# Patient Record
Sex: Male | Born: 1940
Health system: Southern US, Community
[De-identification: ages and names within clinical notes are randomized; demographics above are authoritative.]

## PROBLEM LIST (undated history)

## (undated) DIAGNOSIS — H269 Unspecified cataract: Secondary | ICD-10-CM

## (undated) DIAGNOSIS — G629 Polyneuropathy, unspecified: Secondary | ICD-10-CM

## (undated) DIAGNOSIS — R7611 Nonspecific reaction to tuberculin skin test without active tuberculosis: Secondary | ICD-10-CM

## (undated) DIAGNOSIS — I491 Atrial premature depolarization: Secondary | ICD-10-CM

## (undated) DIAGNOSIS — I10 Essential (primary) hypertension: Secondary | ICD-10-CM

## (undated) DIAGNOSIS — R931 Abnormal findings on diagnostic imaging of heart and coronary circulation: Secondary | ICD-10-CM

## (undated) DIAGNOSIS — E785 Hyperlipidemia, unspecified: Secondary | ICD-10-CM

## (undated) HISTORY — DX: Polyneuropathy, unspecified: G62.9

## (undated) HISTORY — PX: WRIST FRACTURE SURGERY: SHX121

## (undated) HISTORY — DX: Essential (primary) hypertension: I10

## (undated) HISTORY — DX: Nonspecific reaction to tuberculin skin test without active tuberculosis: R76.11

## (undated) HISTORY — DX: Unspecified cataract: H26.9

## (undated) HISTORY — DX: Abnormal findings on diagnostic imaging of heart and coronary circulation: R93.1

## (undated) HISTORY — PX: COLONOSCOPY: SHX174

## (undated) HISTORY — DX: Atrial premature depolarization: I49.1

## (undated) HISTORY — DX: Hyperlipidemia, unspecified: E78.5

## (undated) HISTORY — PX: TONSILLECTOMY: SUR1361

---

## 1968-01-19 DIAGNOSIS — R7611 Nonspecific reaction to tuberculin skin test without active tuberculosis: Secondary | ICD-10-CM

## 1968-01-19 HISTORY — DX: Nonspecific reaction to tuberculin skin test without active tuberculosis: R76.11

## 2003-01-19 HISTORY — PX: ROTATOR CUFF REPAIR: SHX139

## 2003-11-06 ENCOUNTER — Ambulatory Visit: Payer: Self-pay | Admitting: Orthopaedic Surgery

## 2003-12-31 ENCOUNTER — Other Ambulatory Visit: Payer: Self-pay

## 2004-01-07 ENCOUNTER — Ambulatory Visit: Payer: Self-pay | Admitting: Orthopaedic Surgery

## 2006-08-04 ENCOUNTER — Ambulatory Visit: Payer: Self-pay | Admitting: Internal Medicine

## 2007-01-19 DIAGNOSIS — G629 Polyneuropathy, unspecified: Secondary | ICD-10-CM

## 2007-01-19 HISTORY — DX: Polyneuropathy, unspecified: G62.9

## 2007-03-09 ENCOUNTER — Ambulatory Visit: Payer: Self-pay | Admitting: Internal Medicine

## 2007-03-15 ENCOUNTER — Ambulatory Visit: Payer: Self-pay | Admitting: Internal Medicine

## 2007-10-12 ENCOUNTER — Ambulatory Visit: Payer: Self-pay | Admitting: Internal Medicine

## 2007-11-29 ENCOUNTER — Ambulatory Visit: Payer: Self-pay | Admitting: Gastroenterology

## 2010-10-19 ENCOUNTER — Ambulatory Visit (INDEPENDENT_AMBULATORY_CARE_PROVIDER_SITE_OTHER): Payer: Medicare Other | Admitting: Internal Medicine

## 2010-10-19 ENCOUNTER — Encounter: Payer: Self-pay | Admitting: Internal Medicine

## 2010-10-19 DIAGNOSIS — E119 Type 2 diabetes mellitus without complications: Secondary | ICD-10-CM

## 2010-10-19 DIAGNOSIS — G608 Other hereditary and idiopathic neuropathies: Secondary | ICD-10-CM

## 2010-10-19 DIAGNOSIS — E669 Obesity, unspecified: Secondary | ICD-10-CM

## 2010-10-19 DIAGNOSIS — Z79899 Other long term (current) drug therapy: Secondary | ICD-10-CM

## 2010-10-19 DIAGNOSIS — E785 Hyperlipidemia, unspecified: Secondary | ICD-10-CM

## 2010-10-19 DIAGNOSIS — E1142 Type 2 diabetes mellitus with diabetic polyneuropathy: Secondary | ICD-10-CM | POA: Insufficient documentation

## 2010-10-19 DIAGNOSIS — E1169 Type 2 diabetes mellitus with other specified complication: Secondary | ICD-10-CM | POA: Insufficient documentation

## 2010-10-19 DIAGNOSIS — G609 Hereditary and idiopathic neuropathy, unspecified: Secondary | ICD-10-CM

## 2010-10-19 DIAGNOSIS — I10 Essential (primary) hypertension: Secondary | ICD-10-CM

## 2010-10-19 LAB — COMPREHENSIVE METABOLIC PANEL
ALT: 29 U/L (ref 0–53)
AST: 27 U/L (ref 0–37)
Alkaline Phosphatase: 44 U/L (ref 39–117)
BUN: 17 mg/dL (ref 6–23)
Chloride: 105 mEq/L (ref 96–112)
Creatinine, Ser: 1 mg/dL (ref 0.4–1.5)
Total Bilirubin: 0.3 mg/dL (ref 0.3–1.2)

## 2010-10-19 LAB — HEMOGLOBIN A1C: Hgb A1c MFr Bld: 6.7 % — ABNORMAL HIGH (ref 4.6–6.5)

## 2010-10-19 LAB — LIPID PANEL
Cholesterol: 139 mg/dL (ref 0–200)
VLDL: 43.4 mg/dL — ABNORMAL HIGH (ref 0.0–40.0)

## 2010-10-19 NOTE — Assessment & Plan Note (Addendum)
Controlled with glipizide and metformin, last A1c was 7.0 in June. Foot exam was done then and microfilament test as well contineu ace inhibitor,

## 2010-10-19 NOTE — Assessment & Plan Note (Signed)
Well-controlled on current regimen. No changes today. 

## 2010-10-19 NOTE — Patient Instructions (Addendum)
Try using Debrox liquid ear drops to soften the ear wax,  One ear per night.  If no results in a week call me to arrange irrigation.  Use Sudafed PE (phenylephrine) 10 mg every 4 to  6 hours if you develop an upper respiratory infection with sinus congestion , allong with your Nettie pot.  This may prevent you from getting a sinus infection ,  But call if you do.   You should try to get 25 minutes of vigorous exercise done 5 tims weekly to help you lose weight.

## 2010-10-19 NOTE — Progress Notes (Signed)
  Subjective:    Patient ID: [name] [name], male    DOB: 06/11/1940, 70 y.o.   MRN: [id number]  HPI 70 yo white male with history of DM, well controlled, hyperlipidemia and hypertension presents for followup.  He does not check his sugars on a dialy baseis but on random cheks his fasting have bene < 130.  No episodes of hypoglycemia. Has regular annual eye exams and no history of foot ulcers.  Does have mild neuropathy .  Up to date on vaccinations.  Has cut back his alchol cosumpiton to one drink per night since last visit due to elevated triglycerides despite therapy with statin and fenofibrate.  No new complaints.  Past Medical History  Diagnosis Date  . Positive PPD, treated 1970  . Peripheral neuropathy 01/2007    positive EMG studies, negative workup for causes  . Diabetes mellitus     Type 2  . Hyperlipidemia   . Hypertension    No current outpatient prescriptions on file prior to visit.     Review of Systems  Constitutional: Negative for fever, chills, diaphoresis, activity change, appetite change, fatigue and unexpected weight change.  HENT: Negative for hearing loss, ear pain, nosebleeds, congestion, sore throat, facial swelling, rhinorrhea, sneezing, drooling, mouth sores, trouble swallowing, neck pain, neck stiffness, dental problem, voice change, postnasal drip, sinus pressure, tinnitus and ear discharge.   Eyes: Negative for photophobia, pain, discharge, redness, itching and visual disturbance.  Respiratory: Negative for apnea, cough, choking, chest tightness, shortness of breath, wheezing and stridor.   Cardiovascular: Negative for chest pain, palpitations and leg swelling.  Gastrointestinal: Negative for nausea, vomiting, abdominal pain, diarrhea, constipation, blood in stool, abdominal distention, anal bleeding and rectal pain.  Genitourinary: Negative for dysuria, urgency, frequency, hematuria, flank pain, decreased urine volume, scrotal swelling, difficulty urinating and  testicular pain.  Musculoskeletal: Negative for myalgias, back pain, joint swelling, arthralgias and gait problem.  Skin: Negative for color change, rash and wound.  Neurological: Positive for numbness. Negative for dizziness, tremors, seizures, syncope, speech difficulty, weakness, light-headedness and headaches.  Psychiatric/Behavioral: Negative for suicidal ideas, hallucinations, behavioral problems, confusion, sleep disturbance, dysphoric mood, decreased concentration and agitation. The patient is not nervous/anxious.   All other systems reviewed and are negative.       Objective:   Physical Exam  Constitutional: He is oriented to person, place, and time.  HENT:  Head: Normocephalic and atraumatic.  Mouth/Throat: Oropharynx is clear and moist.  Eyes: Conjunctivae and EOM are normal.  Neck: Normal range of motion. Neck supple. No JVD present. No thyromegaly present.  Cardiovascular: Normal rate, regular rhythm and normal heart sounds.   Pulmonary/Chest: Effort normal and breath sounds normal. He has no wheezes. He has no rales.  Abdominal: Soft. Bowel sounds are normal. He exhibits no mass. There is no tenderness. There is no rebound.  Musculoskeletal: Normal range of motion. He exhibits no edema.  Neurological: He is alert and oriented to person, place, and time.  Skin: Skin is warm and dry.  Psychiatric: He has a normal mood and affect.          Assessment & Plan:

## 2010-10-19 NOTE — Assessment & Plan Note (Addendum)
Elevated triglycerides after last visit despite use of statin and fenofibrate was addressed with reduction in  alcohol consumption .  Repeat done today

## 2010-10-20 ENCOUNTER — Encounter: Payer: Self-pay | Admitting: Internal Medicine

## 2010-11-16 ENCOUNTER — Other Ambulatory Visit: Payer: Self-pay | Admitting: Internal Medicine

## 2010-12-03 ENCOUNTER — Other Ambulatory Visit: Payer: Self-pay | Admitting: Internal Medicine

## 2011-01-01 ENCOUNTER — Other Ambulatory Visit: Payer: Self-pay | Admitting: Internal Medicine

## 2011-01-20 ENCOUNTER — Ambulatory Visit (INDEPENDENT_AMBULATORY_CARE_PROVIDER_SITE_OTHER): Payer: Medicare Other | Admitting: Internal Medicine

## 2011-01-20 ENCOUNTER — Encounter: Payer: Self-pay | Admitting: Internal Medicine

## 2011-01-20 DIAGNOSIS — E669 Obesity, unspecified: Secondary | ICD-10-CM

## 2011-01-20 DIAGNOSIS — E785 Hyperlipidemia, unspecified: Secondary | ICD-10-CM

## 2011-01-20 DIAGNOSIS — E119 Type 2 diabetes mellitus without complications: Secondary | ICD-10-CM

## 2011-01-20 DIAGNOSIS — Z125 Encounter for screening for malignant neoplasm of prostate: Secondary | ICD-10-CM | POA: Insufficient documentation

## 2011-01-20 DIAGNOSIS — Z1211 Encounter for screening for malignant neoplasm of colon: Secondary | ICD-10-CM

## 2011-01-20 DIAGNOSIS — I10 Essential (primary) hypertension: Secondary | ICD-10-CM

## 2011-01-20 LAB — COMPREHENSIVE METABOLIC PANEL
ALT: 28 U/L (ref 0–53)
AST: 25 U/L (ref 0–37)
CO2: 26 mEq/L (ref 19–32)
Calcium: 10.1 mg/dL (ref 8.4–10.5)
Chloride: 106 mEq/L (ref 96–112)
GFR: 73.68 mL/min (ref >60.00)
Potassium: 4.7 mEq/L (ref 3.5–5.1)
Sodium: 139 mEq/L (ref 135–145)
Total Protein: 7.3 g/dL (ref 6.0–8.3)

## 2011-01-20 LAB — LIPID PANEL
Cholesterol: 153 mg/dL (ref 0–200)
HDL: 41.2 mg/dL (ref >39.00)
Total CHOL/HDL Ratio: 4
Triglycerides: 204 mg/dL — ABNORMAL HIGH (ref 0.0–149.0)
VLDL: 40.8 mg/dL — ABNORMAL HIGH (ref 0.0–40.0)

## 2011-01-20 LAB — MICROALBUMIN / CREATININE URINE RATIO: Microalb, Ur: 0.5 mg/dL (ref 0.0–1.9)

## 2011-01-20 NOTE — Assessment & Plan Note (Signed)
Trigs were 215 last check,  LDL near 70 on Crestor 20 mg daily.[name] [name]

## 2011-01-20 NOTE — Progress Notes (Signed)
Subjective:    Patient ID: [name] [name], male    DOB: [birth year], 71 y.o.   MRN: [id number]  HPIhere for diabetes followup.    Review of Systems     Objective:   Physical Exam        Assessment & Plan:   Subjective:     [name] [name] is a 71 y.o. male who presents for follow up of diabetes.. Current symptoms include: none. Patient denies foot ulcerations, hyperglycemia, hypoglycemia  and paresthesia of the feet. Evaluation to date has been: fasting blood sugar, fasting lipid panel, hemoglobin A1C and microalbuminuria. Home sugars: BGs consistently in an acceptable range. Current treatments: no recent interventions. Last dilated eye exam Sept 2012.  The following portions of the patient's history were reviewed and updated as appropriate: allergies, current medications, past family history, past medical history, past social history, past surgical history and problem list.  Review of Systems A comprehensive review of systems was negative.    Objective:    BP 118/78  Pulse 80  Temp(Src) 97.6 F (36.4 C) (Oral)  Wt 224 lb (101.606 kg)  General Appearance:    Alert, cooperative, no distress, appears stated age  Head:    Normocephalic, without obvious abnormality, atraumatic  Eyes:    PERRL, conjunctiva/corneas clear, EOM's intact, fundi    benign, both eyes       Ears:    Normal TM's and external ear canals, both ears  Nose:   Nares normal, septum midline, mucosa normal, no drainage    or sinus tenderness  Throat:   Lips, mucosa, and tongue normal; teeth and gums normal  Neck:   Supple, symmetrical, trachea midline, no adenopathy;       thyroid:  No enlargement/tenderness/nodules; no carotid   bruit or JVD  Back:     Symmetric, no curvature, ROM normal, no CVA tenderness  Lungs:     Clear to auscultation bilaterally, respirations unlabored  Chest wall:    No tenderness or deformity  Heart:    Regular rate and rhythm, S1 and S2 normal, no murmur, rub   or gallop  Abdomen:      Soft, non-tender, bowel sounds active all four quadrants,    no masses, no organomegaly        Extremities:   Extremities normal, atraumatic, no cyanosis or edema  Pulses:   2+ and symmetric all extremities  Skin:   Skin color, texture, turgor normal, no rashes or lesions  Lymph nodes:   Cervical, supraclavicular, and axillary nodes normal  Neurologic:   CNII-XII intact. Normal strength, sensation and reflexes      throughout      @DMFOOTEXAM @  Patient was evaluated for proper footwear and sizing.  Laboratory: No components found with this basename: A1C      Assessment:    Diabetes mellitus Type II, under good control.    Obesity (BMI 30-39.9) He has lost 8 lbs since last visit 3 months ago by reducing his portion size.   Hyperlipidemia Trigs were 215 last check,  LDL near 70 on Crestor 20 mg daily..    Hypertension .Well controlled on current regimen. Renal function stable, no changes today.    Updated Medication List Outpatient Encounter Prescriptions as of 01/20/2011  Medication Sig Dispense Refill  . amitriptyline (ELAVIL) 50 MG tablet Take 50 mg by mouth at bedtime.        Marland Kitchen amLODipine-benazepril (LOTREL) 10-40 MG per capsule TAKE ONE CAPSULE DAILY  30 capsule  6  .  aspirin 81 MG tablet Take 81 mg by mouth daily.        . Choline Fenofibrate (TRILIPIX) 135 MG capsule Take 135 mg by mouth daily.        Marland Kitchen co-enzyme Q-10 30 MG capsule Take 30 mg by mouth daily.        . CRESTOR 20 MG tablet TAKE ONE TABLET AT BEDTIME  30 tablet  3  . fish oil-omega-3 fatty acids 1000 MG capsule Take 2 g by mouth daily.        Marland Kitchen glipiZIDE (GLUCOTROL) 5 MG tablet TAKE 1/2 TABLET TWICE A DAY WITH MEALS  90 tablet  3  . metFORMIN (GLUCOPHAGE) 1000 MG tablet TAKE ONE TABLET TWICE A DAY  60 tablet  6  . Multiple Vitamin (MULTIVITAMIN) tablet Take 1 tablet by mouth daily.           Plan:    Discussed general issues about diabetes pathophysiology and management. Addressed ADA  diet. Encouraged aerobic exercise. Reminded to get yearly retinal exam.

## 2011-01-20 NOTE — Assessment & Plan Note (Signed)
Well controlled on current regimen. Renal function stable, no changes today. 

## 2011-01-20 NOTE — Assessment & Plan Note (Signed)
He has lost 8 lbs since last visit 3 months ago by reducing his portion size.

## 2011-02-10 ENCOUNTER — Encounter: Payer: Self-pay | Admitting: Internal Medicine

## 2011-04-14 ENCOUNTER — Other Ambulatory Visit: Payer: Self-pay | Admitting: Internal Medicine

## 2011-04-14 DIAGNOSIS — G609 Hereditary and idiopathic neuropathy, unspecified: Secondary | ICD-10-CM

## 2011-04-14 MED ORDER — AMITRIPTYLINE HCL 50 MG PO TABS: 50.0000 mg | ORAL_TABLET | Freq: Every day | ORAL | Status: DC

## 2011-04-28 ENCOUNTER — Other Ambulatory Visit: Payer: Self-pay | Admitting: Internal Medicine

## 2011-04-28 DIAGNOSIS — E785 Hyperlipidemia, unspecified: Secondary | ICD-10-CM

## 2011-04-28 MED ORDER — CHOLINE FENOFIBRATE 135 MG PO CPDR: 135.0000 mg | DELAYED_RELEASE_CAPSULE | Freq: Every day | ORAL | Status: DC

## 2011-04-28 MED ORDER — ROSUVASTATIN CALCIUM 20 MG PO TABS: 20.0000 mg | ORAL_TABLET | Freq: Every day | ORAL | Status: DC

## 2011-05-20 ENCOUNTER — Ambulatory Visit (INDEPENDENT_AMBULATORY_CARE_PROVIDER_SITE_OTHER): Payer: Medicare Other | Admitting: Internal Medicine

## 2011-05-20 ENCOUNTER — Encounter: Payer: Self-pay | Admitting: Internal Medicine

## 2011-05-20 DIAGNOSIS — E119 Type 2 diabetes mellitus without complications: Secondary | ICD-10-CM

## 2011-05-20 DIAGNOSIS — E785 Hyperlipidemia, unspecified: Secondary | ICD-10-CM

## 2011-05-20 DIAGNOSIS — I10 Essential (primary) hypertension: Secondary | ICD-10-CM

## 2011-05-20 LAB — LIPID PANEL
Total CHOL/HDL Ratio: 4
VLDL: 53.6 mg/dL — ABNORMAL HIGH (ref 0.0–40.0)

## 2011-05-20 MED ORDER — ROSUVASTATIN CALCIUM 20 MG PO TABS: 20.0000 mg | ORAL_TABLET | Freq: Every day | ORAL | Status: DC

## 2011-05-20 NOTE — Assessment & Plan Note (Addendum)
Managed with Crestor and tricor  for goal LDL 70 and triglycerides.  Increasing dose of crestor.

## 2011-05-20 NOTE — Assessment & Plan Note (Signed)
Well-controlled on current medications. Normal renal function. No changes today. 

## 2011-05-20 NOTE — Patient Instructions (Signed)
We will check lipids today and I will e mail you results. Sign up for Mychart  Simply saline: use twice daily to flush sinuses

## 2011-05-20 NOTE — Progress Notes (Signed)
Patient ID: [name] [name], male   DOB: 1940/01/22, 71 y.o.   MRN: [id number]  Patient Active Problem List  Diagnoses  . Diabetes mellitus  . Hyperlipidemia  . Hypertension  . Screening for colon cancer  . Obesity (BMI 30-39.9)    Subjective:  CC:   Chief Complaint  Patient presents with  . Follow-up    HPI:   [name] [name] a 71 y.o. male who presents for followup on diabetes hypertension and hyperlipidemia. Is noted complaints today. He has mild diabetic neuropathy but this has not progressed and is managed with nighttime Elavil. He is up-to-date on eye exams. He takes his medications as directed and has no history of myalgias. He does not check his blood sugars on a regular basis since his control has been excellent for several years. He denies chest pain shortness of breath changes in bowel habits changes in vision and episodes of hypoglycemia.   Past Medical History  Diagnosis Date  . Positive PPD, treated 1970  . Peripheral neuropathy 01/2007    positive EMG studies, negative workup for causes  . Diabetes mellitus     Type 2  . Hyperlipidemia   . Hypertension     Past Surgical History  Procedure Date  . Tonsillectomy   . Rotator cuff repair 2005    right (Dr. Mack Guise)         The following portions of the patient's history were reviewed and updated as appropriate: Allergies, current medications, and problem list.    Review of Systems:   12 Pt  review of systems was negative except those addressed in the HPI,     History   Social History  . Marital Status: Married    Spouse Name: N/A    Number of Children: N/A  . Years of Education: N/A   Occupational History  . Not on file.   Social History Main Topics  . Smoking status: Former Smoker    Types: Cigarettes    Quit date: 10/18/1980  . Smokeless tobacco: Former Neurosurgeon    Types: Chew    Quit date: 10/19/1990  . Alcohol Use: Yes     occasioanl  . Drug Use: No  . Sexually Active: Not on file    Other Topics Concern  . Not on file   Social History Narrative  . No narrative on file    Objective:  BP 118/70  Pulse 88  Temp(Src) 98.2 F (36.8 C) (Oral)  Resp 16  Wt 228 lb 8 oz (103.647 kg)  SpO2 95%  General appearance: alert, cooperative and appears stated age Ears: normal TM's and external ear canals both ears Throat: lips, mucosa, and tongue normal; teeth and gums normal Neck: no adenopathy, no carotid bruit, supple, symmetrical, trachea midline and thyroid not enlarged, symmetric, no tenderness/mass/nodules Back: symmetric, no curvature. ROM normal. No CVA tenderness. Lungs: clear to auscultation bilaterally Heart: regular rate and rhythm, S1, S2 normal, no murmur, click, rub or gallop Abdomen: soft, non-tender; bowel sounds normal; no masses,  no organomegaly Pulses: 2+ and symmetric Skin: Skin color, texture, turgor normal. No rashes or lesions Lymph nodes: Cervical, supraclavicular, and axillary nodes normal.  Assessment and Plan:  Diabetes mellitus To manage with medications oral. Repeat hemoglobin A1c is. He has maintained excellent control over several years time and has no proteinuria. He is up to date on annual eye exams. He does have mild neuropathy. Foot exam was done last visit. He does not walk barefoot.  Hyperlipidemia Managed  with Crestor for goal LDL 70 and triglycerides  Hypertension Well controlled on current medications. Normal renal function. No changes today.    Updated Medication List Outpatient Encounter Prescriptions as of 05/20/2011  Medication Sig Dispense Refill  . amitriptyline (ELAVIL) 50 MG tablet Take 1 tablet (50 mg total) by mouth at bedtime.  90 tablet  2  . amLODipine-benazepril (LOTREL) 10-40 MG per capsule TAKE ONE CAPSULE DAILY  30 capsule  6  . aspirin 81 MG tablet Take 81 mg by mouth daily.        . Choline Fenofibrate (TRILIPIX) 135 MG capsule Take 1 capsule (135 mg total) by mouth daily.  90 capsule  5  . co-enzyme  Q-10 30 MG capsule Take 30 mg by mouth daily.        . fish oil-omega-3 fatty acids 1000 MG capsule Take 2 g by mouth daily.        Marland Kitchen glipiZIDE (GLUCOTROL) 5 MG tablet TAKE 1/2 TABLET TWICE A DAY WITH MEALS  90 tablet  3  . metFORMIN (GLUCOPHAGE) 1000 MG tablet TAKE ONE TABLET TWICE A DAY  60 tablet  6  . Multiple Vitamin (MULTIVITAMIN) tablet Take 1 tablet by mouth daily.        . rosuvastatin (CRESTOR) 20 MG tablet Take 1 tablet (20 mg total) by mouth daily.  30 tablet  5  . DISCONTD: rosuvastatin (CRESTOR) 20 MG tablet Take 1 tablet (20 mg total) by mouth daily.  30 tablet  5     Orders Placed This Encounter  Procedures  . Lipid panel  . COMPLETE METABOLIC PANEL WITH GFR  . Hemoglobin A1c  . LDL cholesterol, direct    No Follow-up on file.

## 2011-05-20 NOTE — Assessment & Plan Note (Signed)
To manage with medications oral. Repeat hemoglobin A1c is. He has maintained excellent control over several years time and has no proteinuria. He is up to date on annual eye exams. He does have mild neuropathy. Foot exam was done last visit. He does not walk barefoot.

## 2011-05-21 LAB — COMPLETE METABOLIC PANEL WITH GFR
ALT: 25 U/L (ref 0–53)
AST: 23 U/L (ref 0–37)
CO2: 24 mEq/L (ref 19–32)
Calcium: 9.3 mg/dL (ref 8.4–10.5)
Chloride: 105 mEq/L (ref 96–112)
GFR, Est African American: 75 mL/min
Sodium: 139 mEq/L (ref 135–145)
Total Bilirubin: 0.3 mg/dL (ref 0.3–1.2)
Total Protein: 6.6 g/dL (ref 6.0–8.3)

## 2011-05-21 MED ORDER — ROSUVASTATIN CALCIUM 40 MG PO TABS: 40.0000 mg | ORAL_TABLET | Freq: Every day | ORAL | Status: DC

## 2011-05-21 NOTE — Progress Notes (Signed)
Addended by: Duncan Dull on: 05/21/2011 04:16 PM   Modules accepted: Orders

## 2011-06-01 ENCOUNTER — Encounter: Payer: Self-pay | Admitting: Internal Medicine

## 2011-07-27 LAB — HM DIABETES EYE EXAM
HM Diabetic Eye Exam: NORMAL
HM Diabetic Eye Exam: NORMAL

## 2011-08-16 ENCOUNTER — Encounter: Payer: Self-pay | Admitting: Internal Medicine

## 2011-09-02 ENCOUNTER — Other Ambulatory Visit: Payer: Self-pay | Admitting: Internal Medicine

## 2011-09-02 MED ORDER — AMLODIPINE BESY-BENAZEPRIL HCL 10-40 MG PO CAPS: 1.0000 | ORAL_CAPSULE | Freq: Every day | ORAL | Status: DC

## 2011-09-22 ENCOUNTER — Ambulatory Visit: Payer: Medicare Other | Admitting: Internal Medicine

## 2011-09-27 ENCOUNTER — Encounter: Payer: Self-pay | Admitting: Internal Medicine

## 2011-09-27 ENCOUNTER — Ambulatory Visit (INDEPENDENT_AMBULATORY_CARE_PROVIDER_SITE_OTHER): Payer: Medicare Other | Admitting: Internal Medicine

## 2011-09-27 VITALS — BP 128/78 | HR 98 | Temp 97.8°F | Resp 18 | Wt 232.0 lb

## 2011-09-27 DIAGNOSIS — G609 Hereditary and idiopathic neuropathy, unspecified: Secondary | ICD-10-CM

## 2011-09-27 DIAGNOSIS — E669 Obesity, unspecified: Secondary | ICD-10-CM

## 2011-09-27 DIAGNOSIS — E119 Type 2 diabetes mellitus without complications: Secondary | ICD-10-CM

## 2011-09-27 DIAGNOSIS — G589 Mononeuropathy, unspecified: Secondary | ICD-10-CM

## 2011-09-27 LAB — COMPREHENSIVE METABOLIC PANEL
BUN: 16 mg/dL (ref 6–23)
CO2: 25 mEq/L (ref 19–32)
Creatinine, Ser: 0.9 mg/dL (ref 0.4–1.5)
GFR: 84.47 mL/min (ref >60.00)
Glucose, Bld: 116 mg/dL — ABNORMAL HIGH (ref 70–99)
Sodium: 137 mEq/L (ref 135–145)
Total Bilirubin: 0.2 mg/dL — ABNORMAL LOW (ref 0.3–1.2)
Total Protein: 7 g/dL (ref 6.0–8.3)

## 2011-09-27 LAB — HEMOGLOBIN A1C: Hgb A1c MFr Bld: 6.6 % — ABNORMAL HIGH (ref 4.6–6.5)

## 2011-09-27 LAB — LIPID PANEL
HDL: 48 mg/dL (ref >39.00)
LDL Cholesterol: 53 mg/dL (ref 0–99)
Total CHOL/HDL Ratio: 3
Triglycerides: 148 mg/dL (ref 0.0–149.0)
VLDL: 29.6 mg/dL (ref 0.0–40.0)

## 2011-09-27 MED ORDER — AMLODIPINE BESY-BENAZEPRIL HCL 10-40 MG PO CAPS: 1.0000 | ORAL_CAPSULE | Freq: Every day | ORAL | Status: DC

## 2011-09-27 MED ORDER — METFORMIN HCL 1000 MG PO TABS: 1000.0000 mg | ORAL_TABLET | Freq: Two times a day (BID) | ORAL | Status: DC

## 2011-09-27 MED ORDER — AMITRIPTYLINE HCL 50 MG PO TABS: 50.0000 mg | ORAL_TABLET | Freq: Every day | ORAL | Status: DC

## 2011-09-27 NOTE — Assessment & Plan Note (Signed)
I have addressed  BMI and recommended a low glycemic index diet utilizing smaller more frequent meals to increase metabolism.  I have also recommended that patient start exercising with a goal of 30 minutes of aerobic exercise a minimum of 5 days per week.  

## 2011-09-27 NOTE — Patient Instructions (Addendum)
This is  Dr. Tullos's version of a  "Low GI"  Diet:  All of the foods can be found at grocery stores and in bulk at [employer].  The Atkins protein bars and shakes are available in more varieties at [employer], [employer] and [employer].     7 AM Breakfast:  Low carbohydrate Protein  Shakes (I recommend the EAS AdvantEdge "Carb Control" shakes  Or the low carb shakes by Atkins.   Both are available everywhere:  In  cases at [employer]  Or in 4 packs at grocery stores and pharmacies  2.5 carbs  (Alternative is  a toasted Arnold's Sandwhich Thin w/ peanut butter, a "Bagel Thin" with cream cheese and salmon) or  a scrambled egg burrito made with a low carb tortilla .  Avoid cereal and bananas, oatmeal too unless you are cooking the old fashioned kind that takes 30-40 minutes to prepare.  the rest is overly processed, has minimal fiber, and is loaded with carbohydrates!   10 AM: Protein bar by Atkins (the snack size, under 200 cal).  There are many varieties , available widely again or in bulk in limited varieties at [employer]Js)  Other so called "protein bars" tend to be loaded with carbohydrates.  Remember, in food advertising, the word "energy" is synonymous for " carbohydrate."  Lunch: sandwich of turkey, (or any lunchmeat, grilled meat or canned tuna), fresh avocado, mayonnaise  and cheese on a lower carbohydrate pita bread, flatbread, or tortilla . Ok to use regular mayonnaise. The bread is the only source or carbohydrate that can be decreased (Joseph's makes a pita bread and a flat bread that are 50 cal and 4 net carbs ; Toufayan makes a low carb flatbread that's 100 cal and 9 net carbs  and  Mission makes a low carb whole wheat tortilla  That is 210 cal and 6 net carbs)  3 PM:  Mid day :  Another protein bar,  Or a  cheese stick (100 cal, 0 carbs),  Or 1 ounce of  almonds, walnuts, pistachios, pecans, peanuts,  Macadamia nuts. Or a Dannon light n Fit greek yogurt, 80 cal 8 net carbs . Avoid "granola"; the dried cranberries  and raisins are loaded with carbohydrates. Mixed nuts ok if no raisins or cranberries or dried fruit.      6 PM  Dinner:  "mean and green:"  Meat/chicken/fish or a high protein legume; , with a green salad, and a low GI  Veggie (broccoli, cauliflower, green beans, spinach, brussel sprouts. Lima beans) : Avoid "Low fat dressings, as well as [place] and [place]! They are loaded with sugar! Instead use ranch, vinagrette,  Blue cheese, etc  9 PM snack : Breyer's "low carb" fudgsicle or  ice cream bar (Carb Smart line), or  Weight Watcher's ice cream bar , or another "no sugar added" ice cream;a serving of fresh berries/cherries with whipped cream (Avoid bananas, pineapple, grapes  and watermelon on a regular basis because they are high in sugar)   Remember that snack Substitutions should be less than 15 to 20 carbs  Per serving. Remember to subtract fiber grams and sugar alcohols to get the "net carbs."    

## 2011-09-27 NOTE — Progress Notes (Signed)
Patient ID: [name] [name], male   DOB: [birth year], 71 y.o.   MRN: [id number]  Subjective:     [name] [name] is a 71 y.o. male who presents for follow up of diabetes.. Current symptoms include: paresthesia of the feet. Patient denies foot ulcerations, hypoglycemia      and visual disturbances. Evaluation to date has been: fasting lipid panel, hemoglobin A1C and microalbuminuria. Home sugars: patient does not check sugars. Current treatments: more intensive attention to diet which has been somewhat effective. Last dilated eye exam 2013. The following portions of the patient's history were reviewed and updated as appropriate: allergies, current medications, past family history, past medical history, past social history, past surgical history and problem list.  Review of Systems A comprehensive review of systems was negative.    Objective:   BP 128/78  Pulse 98  Temp 97.8 F (36.6 C) (Oral)  Resp 18  Wt 232 lb (105.235 kg)  SpO2 95% General appearance: alert, cooperative and appears stated age Lungs: clear to auscultation bilaterally Heart: regular rate and rhythm, S1, S2 normal, no murmur, click, rub or gallop Abdomen: soft, non-tender; bowel sounds normal; no masses,  no organomegaly Extremities: extremities normal, atraumatic, no cyanosis or edema Pulses: 2+ and symmetric  @DMFOOTEXAM @  Patient was evaluated for proper footwear and sizing.  Laboratory: Hgba1c, CMET, fasting lipids.     Assessment:    Diabetes mellitus Type II, under excellent control historically.  Repeat labs due.   Up to date on eye exams,  Foot exam unchanged.   .Obesity (BMI 30-39.9) I have addressed  BMI and recommended a low glycemic index diet utilizing smaller more frequent meals to increase metabolism.  I have also recommended that patient start exercising with a goal of 30 minutes of aerobic exercise a minimum of 5 days per week.     Updated Medication List Outpatient Encounter Prescriptions as of  09/27/2011  Medication Sig Dispense Refill  . amitriptyline (ELAVIL) 50 MG tablet Take 1 tablet (50 mg total) by mouth at bedtime.  90 tablet  2  . amLODipine-benazepril (LOTREL) 10-40 MG per capsule Take 1 capsule by mouth daily.  90 capsule  6  . aspirin 81 MG tablet Take 81 mg by mouth daily.        . Choline Fenofibrate (TRILIPIX) 135 MG capsule Take 1 capsule (135 mg total) by mouth daily.  90 capsule  5  . co-enzyme Q-10 30 MG capsule Take 30 mg by mouth daily.        . fish oil-omega-3 fatty acids 1000 MG capsule Take 2 g by mouth daily.        Marland Kitchen glipiZIDE (GLUCOTROL) 5 MG tablet TAKE 1/2 TABLET TWICE A DAY WITH MEALS  90 tablet  3  . metFORMIN (GLUCOPHAGE) 1000 MG tablet Take 1 tablet (1,000 mg total) by mouth 2 (two) times daily with a meal.  180 tablet  6  . Multiple Vitamin (MULTIVITAMIN) tablet Take 1 tablet by mouth daily.        . rosuvastatin (CRESTOR) 40 MG tablet Take 1 tablet (40 mg total) by mouth daily.  30 tablet  5  . DISCONTD: amitriptyline (ELAVIL) 50 MG tablet Take 1 tablet (50 mg total) by mouth at bedtime.  90 tablet  2  . DISCONTD: amLODipine-benazepril (LOTREL) 10-40 MG per capsule Take 1 capsule by mouth daily.  30 capsule  6  . DISCONTD: metFORMIN (GLUCOPHAGE) 1000 MG tablet TAKE ONE TABLET TWICE A DAY  60 tablet  6      Plan:    Encouraged aerobic exercise. Labs: fasting lipid panel, hemoglobin A1C and microalbuminuria.

## 2011-09-29 ENCOUNTER — Ambulatory Visit (INDEPENDENT_AMBULATORY_CARE_PROVIDER_SITE_OTHER): Payer: Medicare Other

## 2011-09-29 DIAGNOSIS — Z23 Encounter for immunization: Secondary | ICD-10-CM

## 2011-12-27 ENCOUNTER — Ambulatory Visit: Payer: Medicare Other | Admitting: Internal Medicine

## 2012-01-13 ENCOUNTER — Other Ambulatory Visit: Payer: Self-pay | Admitting: Internal Medicine

## 2012-01-13 DIAGNOSIS — E785 Hyperlipidemia, unspecified: Secondary | ICD-10-CM

## 2012-01-13 MED ORDER — ROSUVASTATIN CALCIUM 40 MG PO TABS: 40.0000 mg | ORAL_TABLET | Freq: Every day | ORAL | Status: DC

## 2012-01-13 NOTE — Telephone Encounter (Signed)
Pt is needing refill on Crestor 40 mg tablets QTY 30.

## 2012-01-13 NOTE — Telephone Encounter (Signed)
Med filled.  

## 2012-01-27 ENCOUNTER — Ambulatory Visit (INDEPENDENT_AMBULATORY_CARE_PROVIDER_SITE_OTHER): Payer: 59 | Admitting: Internal Medicine

## 2012-01-27 ENCOUNTER — Encounter: Payer: Self-pay | Admitting: Internal Medicine

## 2012-01-27 VITALS — BP 118/78 | HR 100 | Temp 97.6°F | Resp 16 | Wt 232.5 lb

## 2012-01-27 DIAGNOSIS — Z1211 Encounter for screening for malignant neoplasm of colon: Secondary | ICD-10-CM

## 2012-01-27 DIAGNOSIS — Z1331 Encounter for screening for depression: Secondary | ICD-10-CM

## 2012-01-27 DIAGNOSIS — E119 Type 2 diabetes mellitus without complications: Secondary | ICD-10-CM

## 2012-01-27 DIAGNOSIS — I10 Essential (primary) hypertension: Secondary | ICD-10-CM

## 2012-01-27 DIAGNOSIS — G4733 Obstructive sleep apnea (adult) (pediatric): Secondary | ICD-10-CM

## 2012-01-27 LAB — COMPREHENSIVE METABOLIC PANEL
ALT: 34 U/L (ref 0–53)
CO2: 24 mEq/L (ref 19–32)
Creatinine, Ser: 1.1 mg/dL (ref 0.4–1.5)
GFR: 72.67 mL/min (ref >60.00)
Total Bilirubin: 0.6 mg/dL (ref 0.3–1.2)

## 2012-01-27 LAB — HEMOGLOBIN A1C: Hgb A1c MFr Bld: 6.6 % — ABNORMAL HIGH (ref 4.6–6.5)

## 2012-01-28 DIAGNOSIS — G4733 Obstructive sleep apnea (adult) (pediatric): Secondary | ICD-10-CM | POA: Insufficient documentation

## 2012-01-28 NOTE — Assessment & Plan Note (Signed)
Well-controlled.. The patient wears CPAP every night.

## 2012-01-28 NOTE — Assessment & Plan Note (Addendum)
Excellent control and current medications. Hemoglobin A1c is less than 7. No proteinuria by September check. Up-to-date on diabetic eye exams by [facility] July 2013. Does not walk barefoot. Feet are in excellent condition despite having sensory loss

## 2012-01-28 NOTE — Progress Notes (Signed)
Patient ID: [name] [name], male   DOB: 08-30-1940, 72 y.o.   MRN: [id number]  Patient Active Problem List  Diagnosis  . Type II or unspecified type diabetes mellitus with neurological manifestations, not stated as uncontrolled(250.60)  . Hyperlipidemia  . Hypertension  . Screening for colon cancer  . Obesity (BMI 30-39.9)    Subjective:  CC:   Chief Complaint  Patient presents with  . Follow-up    HPI:   [name] [name] a 72 y.o. male who presents Three-month followup on diabetes mellitus, hypertension, and hyperlipidemia. He has been taking all his medications as directed. He has no adverse effects from his statin therapy or from his fenofibrate. He has no joint pain numbness in his feet and no recent hypoglycemic events. He does not check his blood sugars regularly. He has not been exercising very much due to the cold weather.   Past Medical History  Diagnosis Date  . Positive PPD, treated 1970  . Peripheral neuropathy 01/2007    positive EMG studies, negative workup for causes  . Diabetes mellitus     Type 2  . Hyperlipidemia   . Hypertension     Past Surgical History  Procedure Date  . Tonsillectomy   . Rotator cuff repair 2005    right (Dr. Mack Guise)         The following portions of the patient's history were reviewed and updated as appropriate: Allergies, current medications, and problem list.    Review of Systems:   Patient denies headache, fevers, malaise, unintentional weight loss, skin rash, eye pain, sinus congestion and sinus pain, sore throat, dysphagia,  hemoptysis , cough, dyspnea, wheezing, chest pain, palpitations, orthopnea, edema, abdominal pain, nausea, melena, diarrhea, constipation, flank pain, dysuria, hematuria, urinary  Frequency, nocturia, numbness, tingling, seizures,  Focal weakness, Loss of consciousness,  Tremor, insomnia, depression, anxiety, and suicidal ideation.         History   Social History  . Marital Status: Married     Spouse Name: N/A    Number of Children: N/A  . Years of Education: N/A   Occupational History  . Not on file.   Social History Main Topics  . Smoking status: Former Smoker    Types: Cigarettes    Quit date: 10/18/1980  . Smokeless tobacco: Former Neurosurgeon    Types: Chew    Quit date: 10/19/1990  . Alcohol Use: Yes     Comment: occasioanl  . Drug Use: No  . Sexually Active: Not on file   Other Topics Concern  . Not on file   Social History Narrative  . No narrative on file    Objective:  BP 118/78  Pulse 100  Temp 97.6 F (36.4 C) (Oral)  Resp 16  Wt 232 lb 8 oz (105.461 kg)  SpO2 95%  General appearance: alert, cooperative and appears stated age Ears: normal TM's and external ear canals both ears Throat: lips, mucosa, and tongue normal; teeth and gums normal Neck: no adenopathy, no carotid bruit, supple, symmetrical, trachea midline and thyroid not enlarged, symmetric, no tenderness/mass/nodules Back: symmetric, no curvature. ROM normal. No CVA tenderness. Lungs: clear to auscultation bilaterally Heart: regular rate and rhythm, S1, S2 normal, no murmur, click, rub or gallop Abdomen: soft, non-tender; bowel sounds normal; no masses,  no organomegaly Pulses: 2+ and symmetric. Cap refill < 2 sec  Skin: Skin color, texture, turgor normal. No rashes or lesions Lymph nodes: Cervical, supraclavicular, and axillary nodes normal. Foot exam:  Nails  are well trimmed,  No callouses,  Sensation intact to microfilament in 6/10 places bilaterally  Assessment and Plan:  Type II or unspecified type diabetes mellitus with neurological manifestations, not stated as uncontrolled(250.60) Excellent control and current medications. Hemoglobin A1c is less than 7. No proteinuria by September check. Up-to-date on diabetic eye exams by [facility] July 2013. Does not walk barefoot. Feet are in excellent condition despite having sensory loss  Hypertension .Well controlled on current  regimen. Renal function stable, no changes today.  OSA on CPAP Well-controlled.. The patient wears CPAP every night.   Updated Medication List Outpatient Encounter Prescriptions as of 01/27/2012  Medication Sig Dispense Refill  . amitriptyline (ELAVIL) 50 MG tablet Take 1 tablet (50 mg total) by mouth at bedtime.  90 tablet  2  . amLODipine-benazepril (LOTREL) 10-40 MG per capsule Take 1 capsule by mouth daily.  90 capsule  6  . aspirin 81 MG tablet Take 81 mg by mouth daily.        . Choline Fenofibrate (TRILIPIX) 135 MG capsule Take 1 capsule (135 mg total) by mouth daily.  90 capsule  5  . co-enzyme Q-10 30 MG capsule Take 30 mg by mouth daily.        . fish oil-omega-3 fatty acids 1000 MG capsule Take 2 g by mouth daily.        Marland Kitchen glipiZIDE (GLUCOTROL) 5 MG tablet TAKE 1/2 TABLET TWICE A DAY WITH MEALS  90 tablet  3  . metFORMIN (GLUCOPHAGE) 1000 MG tablet Take 1 tablet (1,000 mg total) by mouth 2 (two) times daily with a meal.  180 tablet  6  . Multiple Vitamin (MULTIVITAMIN) tablet Take 1 tablet by mouth daily.        . rosuvastatin (CRESTOR) 40 MG tablet Take 1 tablet (40 mg total) by mouth daily.  30 tablet  5

## 2012-01-28 NOTE — Assessment & Plan Note (Signed)
Well controlled on current regimen. Renal function stable, no changes today. 

## 2012-02-29 LAB — HM COLONOSCOPY: HM Colonoscopy: 4

## 2012-03-14 ENCOUNTER — Ambulatory Visit: Payer: Self-pay | Admitting: General Surgery

## 2012-04-12 ENCOUNTER — Other Ambulatory Visit: Payer: Self-pay | Admitting: *Deleted

## 2012-04-12 MED ORDER — GLIPIZIDE 5 MG PO TABS: ORAL_TABLET | ORAL | Status: DC

## 2012-04-12 NOTE — Telephone Encounter (Signed)
Med filled.  

## 2012-04-24 ENCOUNTER — Encounter: Payer: Self-pay | Admitting: Internal Medicine

## 2012-04-28 ENCOUNTER — Encounter: Payer: Self-pay | Admitting: Internal Medicine

## 2012-04-28 ENCOUNTER — Ambulatory Visit (INDEPENDENT_AMBULATORY_CARE_PROVIDER_SITE_OTHER): Payer: 59 | Admitting: Internal Medicine

## 2012-04-28 VITALS — BP 128/74 | HR 100 | Temp 97.8°F | Resp 16 | Wt 229.5 lb

## 2012-04-28 DIAGNOSIS — E669 Obesity, unspecified: Secondary | ICD-10-CM

## 2012-04-28 DIAGNOSIS — Z83719 Family history of colon polyps, unspecified: Secondary | ICD-10-CM

## 2012-04-28 DIAGNOSIS — G4733 Obstructive sleep apnea (adult) (pediatric): Secondary | ICD-10-CM

## 2012-04-28 DIAGNOSIS — E119 Type 2 diabetes mellitus without complications: Secondary | ICD-10-CM

## 2012-04-28 DIAGNOSIS — Z1211 Encounter for screening for malignant neoplasm of colon: Secondary | ICD-10-CM

## 2012-04-28 DIAGNOSIS — E1149 Type 2 diabetes mellitus with other diabetic neurological complication: Secondary | ICD-10-CM

## 2012-04-28 DIAGNOSIS — E785 Hyperlipidemia, unspecified: Secondary | ICD-10-CM

## 2012-04-28 DIAGNOSIS — I10 Essential (primary) hypertension: Secondary | ICD-10-CM

## 2012-04-28 DIAGNOSIS — Z9989 Dependence on other enabling machines and devices: Secondary | ICD-10-CM

## 2012-04-28 DIAGNOSIS — Z8371 Family history of colonic polyps: Secondary | ICD-10-CM

## 2012-04-28 LAB — MICROALBUMIN / CREATININE URINE RATIO
Microalb Creat Ratio: 0.9 mg/g (ref 0.0–30.0)
Microalb, Ur: 0.9 mg/dL (ref 0.0–1.9)

## 2012-04-28 LAB — COMPREHENSIVE METABOLIC PANEL
ALT: 35 U/L (ref 0–53)
Alkaline Phosphatase: 47 U/L (ref 39–117)
Creatinine, Ser: 1 mg/dL (ref 0.4–1.5)
Glucose, Bld: 107 mg/dL — ABNORMAL HIGH (ref 70–99)
Sodium: 135 mEq/L (ref 135–145)
Total Bilirubin: 0.2 mg/dL — ABNORMAL LOW (ref 0.3–1.2)
Total Protein: 7 g/dL (ref 6.0–8.3)

## 2012-04-28 LAB — HEMOGLOBIN A1C: Hgb A1c MFr Bld: 7.1 % — ABNORMAL HIGH (ref 4.6–6.5)

## 2012-04-28 NOTE — Progress Notes (Signed)
Patient ID: [name] [name], male   DOB: February 22, 1940, 72 y.o.   MRN: [id number]   Patient Active Problem List  Diagnosis  . Type II or unspecified type diabetes mellitus with neurological manifestations, not stated as uncontrolled(250.60)  . Hyperlipidemia  . Hypertension  . Screening for colon cancer  . Obesity (BMI 30-39.9)  . OSA (obstructive sleep apnea)  . FH: colonic polyps    Subjective:  CC:   Chief Complaint  Patient presents with  . Follow-up    3 month    HPI:   [name] Vandyne Meltonis a 72 y.o. male who presents for three-month followup on diabetes mellitus, hyperlipidemia, and hypertension. He has been taking all of his medications as directed. He is tolerating all his medications without myalgias or hypoglycemic events. He does not check his blood sugars. He has no complaints.   annual eye exams are up-to-date. He has seen his cardiologist, Dr. Gwen Pounds, for six-month followup and no changes were made to medications. He is sleeping well.   Past Medical History  Diagnosis Date  . Positive PPD, treated 1970  . Peripheral neuropathy 01/2007    positive EMG studies, negative workup for causes  . Diabetes mellitus     Type 2  . Hyperlipidemia   . Hypertension     Past Surgical History  Procedure Laterality Date  . Tonsillectomy    . Rotator cuff repair  2005    right (Dr. Mack Guise)       The following portions of the patient's history were reviewed and updated as appropriate: Allergies, current medications, and problem list.    Review of Systems:   Patient denies headache, fevers, malaise, unintentional weight loss, skin rash, eye pain, sinus congestion and sinus pain, sore throat, dysphagia,  hemoptysis , cough, dyspnea, wheezing, chest pain, palpitations, orthopnea, edema, abdominal pain, nausea, melena, diarrhea, constipation, flank pain, dysuria, hematuria, urinary  Frequency, nocturia, numbness, tingling, seizures,  Focal weakness, Loss of consciousness,   Tremor, insomnia, depression, anxiety, and suicidal ideation.      History   Social History  . Marital Status: Married    Spouse Name: N/A    Number of Children: N/A  . Years of Education: N/A   Occupational History  . Not on file.   Social History Main Topics  . Smoking status: Former Smoker    Types: Cigarettes    Quit date: 10/18/1980  . Smokeless tobacco: Former Neurosurgeon    Types: Chew    Quit date: 10/19/1990  . Alcohol Use: Yes     Comment: occasioanl  . Drug Use: No  . Sexually Active: Not on file   Other Topics Concern  . Not on file   Social History Narrative  . No narrative on file    Objective:  BP 128/74  Pulse 100  Temp(Src) 97.8 F (36.6 C) (Oral)  Resp 16  Wt 229 lb 8 oz (104.101 kg)  BMI 32.93 kg/m2  SpO2 98%  General appearance: alert, cooperative and appears stated age Ears: normal TM's and external ear canals both ears Throat: lips, mucosa, and tongue normal; teeth and gums normal Neck: no adenopathy, no carotid bruit, supple, symmetrical, trachea midline and thyroid not enlarged, symmetric, no tenderness/mass/nodules Back: symmetric, no curvature. ROM normal. No CVA tenderness. Lungs: clear to auscultation bilaterally Heart: regular rate and rhythm, S1, S2 normal, no murmur, click, rub or gallop Abdomen: soft, non-tender; bowel sounds normal; no masses,  no organomegaly Pulses: 2+ and symmetric Skin: Skin color, texture, turgor  normal. No rashes or lesions Lymph nodes: Cervical, supraclavicular, and axillary nodes normal.  Assessment and Plan:  OSA (obstructive sleep apnea) Has not worn CPAP since  His study in 2008.  No symptoms of heart failure , and HTN is controlled.  He is not interested in repeating study since he is asymptomatic.   Type II or unspecified type diabetes mellitus with neurological manifestations, not stated as uncontrolled(250.60) Well-controlled on current medications.  hemoglobin A1c is 7.1. He is up-to-date on eye  exams and his foot exam is normal.  Normal urine microalbumin to creatinine ratio . He is on the appropriate medications.  Screening for colon cancer .Repeat screening was done Feb 2014 by [facility]. Several polyps found.  R/u 5 yrs  Obesity (BMI 30-39.9) BMI is stable. Patient's CRFS are all well controlled.   Hypertension Well controlled on current regimen. Renal function stable, no changes today.  Hyperlipidemia Well controlled, LDL excellent on current therapy.  Liver and kidney function are normal.  No changes today.  Repeat CMET and lipids in 3 months     Updated Medication List Outpatient Encounter Prescriptions as of 04/28/2012  Medication Sig Dispense Refill  . amitriptyline (ELAVIL) 50 MG tablet Take 1 tablet (50 mg total) by mouth at bedtime.  90 tablet  2  . amLODipine-benazepril (LOTREL) 10-40 MG per capsule Take 1 capsule by mouth daily.  90 capsule  6  . aspirin 81 MG tablet Take 81 mg by mouth daily.        . Choline Fenofibrate (TRILIPIX) 135 MG capsule Take 1 capsule (135 mg total) by mouth daily.  90 capsule  5  . co-enzyme Q-10 30 MG capsule Take 30 mg by mouth daily.        . fish oil-omega-3 fatty acids 1000 MG capsule Take 2 g by mouth daily.        Marland Kitchen glipiZIDE (GLUCOTROL) 5 MG tablet TAKE 1/2 TABLET TWICE A DAY WITH MEALS  90 tablet  3  . metFORMIN (GLUCOPHAGE) 1000 MG tablet Take 1 tablet (1,000 mg total) by mouth 2 (two) times daily with a meal.  180 tablet  6  . Multiple Vitamin (MULTIVITAMIN) tablet Take 1 tablet by mouth daily.        . rosuvastatin (CRESTOR) 40 MG tablet Take 1 tablet (40 mg total) by mouth daily.  30 tablet  5   No facility-administered encounter medications on file as of 04/28/2012.     Orders Placed This Encounter  Procedures  . Lipid panel  . Hemoglobin A1c  . Microalbumin / creatinine urine ratio  . Comprehensive metabolic panel  . HM COLONOSCOPY    Return in about 3 months (around 07/28/2012).

## 2012-04-28 NOTE — Assessment & Plan Note (Addendum)
Has not worn CPAP since  His study in 2008.  No symptoms of heart failure , and HTN is controlled.  He is not interested in repeating study since he is asymptomatic.

## 2012-04-29 ENCOUNTER — Encounter: Payer: Self-pay | Admitting: Internal Medicine

## 2012-04-29 DIAGNOSIS — Z8371 Family history of colonic polyps: Secondary | ICD-10-CM | POA: Insufficient documentation

## 2012-04-29 DIAGNOSIS — Z83719 Family history of colon polyps, unspecified: Secondary | ICD-10-CM | POA: Insufficient documentation

## 2012-04-29 NOTE — Assessment & Plan Note (Signed)
.  Repeat screening was done Feb 2014 by [facility]. Several polyps found.  R/u 5 yrs

## 2012-04-29 NOTE — Assessment & Plan Note (Signed)
Well controlled, LDL excellent on current therapy.  Liver and kidney function are normal.  No changes today.  Repeat CMET and lipids in 3 months

## 2012-04-29 NOTE — Assessment & Plan Note (Signed)
Well controlled on current regimen. Renal function stable, no changes today. 

## 2012-04-29 NOTE — Assessment & Plan Note (Signed)
BMI is stable. Patient's CRFS are all well controlled.

## 2012-04-29 NOTE — Assessment & Plan Note (Signed)
Well-controlled on current medications.  hemoglobin A1c is 7.1. He is up-to-date on eye exams and his foot exam is normal.  Normal urine microalbumin to creatinine ratio . He is on the appropriate medications.

## 2012-05-17 ENCOUNTER — Telehealth: Payer: Self-pay | Admitting: *Deleted

## 2012-05-17 DIAGNOSIS — E785 Hyperlipidemia, unspecified: Secondary | ICD-10-CM

## 2012-05-17 NOTE — Telephone Encounter (Signed)
Refill Request  Fenofibric Acid ER 135 mg  #90  Take 1 capsule daily

## 2012-05-18 MED ORDER — CHOLINE FENOFIBRATE 135 MG PO CPDR: 135.0000 mg | DELAYED_RELEASE_CAPSULE | Freq: Every day | ORAL | Status: DC

## 2012-05-18 NOTE — Telephone Encounter (Signed)
Medication filled.  

## 2012-07-11 ENCOUNTER — Other Ambulatory Visit: Payer: Self-pay | Admitting: *Deleted

## 2012-07-11 DIAGNOSIS — G609 Hereditary and idiopathic neuropathy, unspecified: Secondary | ICD-10-CM

## 2012-07-11 MED ORDER — AMITRIPTYLINE HCL 50 MG PO TABS: 50.0000 mg | ORAL_TABLET | Freq: Every day | ORAL | Status: DC

## 2012-07-17 ENCOUNTER — Other Ambulatory Visit: Payer: Self-pay | Admitting: *Deleted

## 2012-07-17 DIAGNOSIS — E785 Hyperlipidemia, unspecified: Secondary | ICD-10-CM

## 2012-07-17 MED ORDER — ROSUVASTATIN CALCIUM 40 MG PO TABS: 40.0000 mg | ORAL_TABLET | Freq: Every day | ORAL | Status: DC

## 2012-07-28 ENCOUNTER — Ambulatory Visit (INDEPENDENT_AMBULATORY_CARE_PROVIDER_SITE_OTHER): Payer: Medicare Other | Admitting: Internal Medicine

## 2012-07-28 VITALS — BP 144/82 | HR 96 | Temp 97.7°F | Resp 14 | Wt 227.8 lb

## 2012-07-28 DIAGNOSIS — I10 Essential (primary) hypertension: Secondary | ICD-10-CM

## 2012-07-28 DIAGNOSIS — E785 Hyperlipidemia, unspecified: Secondary | ICD-10-CM

## 2012-07-28 DIAGNOSIS — E1149 Type 2 diabetes mellitus with other diabetic neurological complication: Secondary | ICD-10-CM

## 2012-07-28 LAB — COMPREHENSIVE METABOLIC PANEL
Alkaline Phosphatase: 51 U/L (ref 39–117)
CO2: 26 mEq/L (ref 19–32)
Creatinine, Ser: 1 mg/dL (ref 0.4–1.5)
GFR: 81.26 mL/min (ref >60.00)
Glucose, Bld: 135 mg/dL — ABNORMAL HIGH (ref 70–99)
Sodium: 139 mEq/L (ref 135–145)
Total Bilirubin: 0.5 mg/dL (ref 0.3–1.2)
Total Protein: 7.5 g/dL (ref 6.0–8.3)

## 2012-07-28 LAB — LDL CHOLESTEROL, DIRECT: Direct LDL: 69 mg/dL

## 2012-07-28 LAB — HEMOGLOBIN A1C: Hgb A1c MFr Bld: 6.7 % — ABNORMAL HIGH (ref 4.6–6.5)

## 2012-07-28 LAB — LIPID PANEL
Cholesterol: 133 mg/dL (ref 0–200)
HDL: 40.9 mg/dL (ref >39.00)
Total CHOL/HDL Ratio: 3
Triglycerides: 215 mg/dL — ABNORMAL HIGH (ref 0.0–149.0)

## 2012-07-28 NOTE — Patient Instructions (Addendum)
You are doing very well.  I will send you copies of your labs when they are resulted  Don't forget to have your annual eye exam and a skin check with your dermatologist

## 2012-07-30 ENCOUNTER — Encounter: Payer: Self-pay | Admitting: Internal Medicine

## 2012-07-30 NOTE — Assessment & Plan Note (Signed)
Well-controlled on current medications.  hemoglobin A1c has been consistently less than 7.0 . He is up-to-date on eye exams and foot exam. He is on the appropriate medications.

## 2012-07-30 NOTE — Assessment & Plan Note (Signed)
Well controlled on current regimen. Renal function stable, no changes today. 

## 2012-07-30 NOTE — Assessment & Plan Note (Signed)
Well controlled on current statin therapy.   Liver enzymes are normal , no changes today.  

## 2012-07-30 NOTE — Progress Notes (Signed)
Patient ID: [name] [name], male   DOB: [birth year], 72 y.o.   MRN: [id number]  Patient ID: [name] [name], male   DOB: [birth year], 72 y.o.   MRN: [id number]   Patient Active Problem List  Diagnosis  . Type II or unspecified type diabetes mellitus with neurological manifestations, not stated as uncontrolled(250.60)  . Hyperlipidemia  . Hypertension  . Screening for colon cancer  . Obesity (BMI 30-39.9)  . OSA (obstructive sleep apnea)  . FH: colonic polyps    Subjective:  CC:   Chief Complaint  Patient presents with  . Follow-up    3 month    HPI:   [name] Rathbone Meltonis a 72 y.o. male who presents for three-month followup on diabetes mellitus, hyperlipidemia, and hypertension. He has been taking all of his medications as directed. He is tolerating all his medications without myalgias or hypoglycemic events. He does not check his blood sugars. He has no complaints.   annual eye exams are up-to-date. He has seen his cardiologist, Dr. Gwen Pounds, for six-month followup and no changes were made to medications. He is sleeping well.   Past Medical History  Diagnosis Date  . Positive PPD, treated 1970  . Peripheral neuropathy 01/2007    positive EMG studies, negative workup for causes  . Diabetes mellitus     Type 2  . Hyperlipidemia   . Hypertension     Past Surgical History  Procedure Laterality Date  . Tonsillectomy    . Rotator cuff repair  2005    right (Dr. Mack Guise)       The following portions of the patient's history were reviewed and updated as appropriate: Allergies, current medications, and problem list.    Review of Systems:   Patient denies headache, fevers, malaise, unintentional weight loss, skin rash, eye pain, sinus congestion and sinus pain, sore throat, dysphagia,  hemoptysis , cough, dyspnea, wheezing, chest pain, palpitations, orthopnea, edema, abdominal pain, nausea, melena, diarrhea, constipation, flank pain, dysuria, hematuria, urinary  Frequency, nocturia,  numbness, tingling, seizures,  Focal weakness, Loss of consciousness,  Tremor, insomnia, depression, anxiety, and suicidal ideation.      History   Social History  . Marital Status: Married    Spouse Name: N/A    Number of Children: N/A  . Years of Education: N/A   Occupational History  . Not on file.   Social History Main Topics  . Smoking status: Former Smoker    Types: Cigarettes    Quit date: 10/18/1980  . Smokeless tobacco: Former Neurosurgeon    Types: Chew    Quit date: 10/19/1990  . Alcohol Use: Yes     Comment: occasioanl  . Drug Use: No  . Sexually Active: Not on file   Other Topics Concern  . Not on file   Social History Narrative  . No narrative on file    Objective:  BP 128/74  Pulse 100  Temp(Src) 97.8 F (36.6 C) (Oral)  Resp 16  Wt 229 lb 8 oz (104.101 kg)  BMI 32.93 kg/m2  SpO2 98%  General appearance: alert, cooperative and appears stated age Ears: normal TM's and external ear canals both ears Throat: lips, mucosa, and tongue normal; teeth and gums normal Neck: no adenopathy, no carotid bruit, supple, symmetrical, trachea midline and thyroid not enlarged, symmetric, no tenderness/mass/nodules Back: symmetric, no curvature. ROM normal. No CVA tenderness. Lungs: clear to auscultation bilaterally Heart: regular rate and rhythm, S1, S2 normal, no murmur, click, rub or gallop Abdomen: soft, non-tender;  bowel sounds normal; no masses,  no organomegaly Pulses: 2+ and symmetric Skin: Skin color, texture, turgor normal. No rashes or lesions Lymph nodes: Cervical, supraclavicular, and axillary nodes normal. Foot exam:  Nails are well trimmed,  No callouses,  Sensation is deficient to light touch   Assessment and Plan:  Hyperlipidemia Well controlled on current statin therapy.   Liver enzymes are normal , no changes today.  Hypertension Well controlled on current regimen. Renal function stable, no changes today.  Type II or unspecified type diabetes  mellitus with neurological manifestations, not stated as uncontrolled(250.60) Well-controlled on current medications.  hemoglobin A1c has been consistently less than 7.0 . He is up-to-date on eye exams and foot exam. He is on the appropriate medications.   Updated Medication List Outpatient Encounter Prescriptions as of 07/28/2012  Medication Sig Dispense Refill  . amitriptyline (ELAVIL) 50 MG tablet Take 1 tablet (50 mg total) by mouth at bedtime.  90 tablet  0  . amLODipine-benazepril (LOTREL) 10-40 MG per capsule Take 1 capsule by mouth daily.  90 capsule  6  . aspirin 81 MG tablet Take 81 mg by mouth daily.        . Choline Fenofibrate (TRILIPIX) 135 MG capsule Take 1 capsule (135 mg total) by mouth daily.  90 capsule  5  . co-enzyme Q-10 30 MG capsule Take 30 mg by mouth daily.        . fish oil-omega-3 fatty acids 1000 MG capsule Take 2 g by mouth daily.        Marland Kitchen glipiZIDE (GLUCOTROL) 5 MG tablet TAKE 1/2 TABLET TWICE A DAY WITH MEALS  90 tablet  3  . metFORMIN (GLUCOPHAGE) 1000 MG tablet Take 1 tablet (1,000 mg total) by mouth 2 (two) times daily with a meal.  180 tablet  6  . Multiple Vitamin (MULTIVITAMIN) tablet Take 1 tablet by mouth daily.        . rosuvastatin (CRESTOR) 40 MG tablet Take 1 tablet (40 mg total) by mouth daily.  30 tablet  5   No facility-administered encounter medications on file as of 07/28/2012.

## 2012-08-01 NOTE — Telephone Encounter (Signed)
Mailed unread message to pt  

## 2012-08-03 ENCOUNTER — Encounter: Payer: Self-pay | Admitting: *Deleted

## 2012-10-11 ENCOUNTER — Other Ambulatory Visit: Payer: Self-pay | Admitting: Internal Medicine

## 2012-10-11 NOTE — Telephone Encounter (Signed)
Eprescribed.

## 2012-10-16 ENCOUNTER — Other Ambulatory Visit: Payer: Self-pay | Admitting: Internal Medicine

## 2012-10-31 ENCOUNTER — Ambulatory Visit (INDEPENDENT_AMBULATORY_CARE_PROVIDER_SITE_OTHER): Payer: Medicare Other | Admitting: Internal Medicine

## 2012-10-31 ENCOUNTER — Encounter: Payer: Self-pay | Admitting: Internal Medicine

## 2012-10-31 VITALS — BP 112/70 | HR 90 | Temp 97.8°F | Resp 12 | Wt 231.5 lb

## 2012-10-31 DIAGNOSIS — Z1211 Encounter for screening for malignant neoplasm of colon: Secondary | ICD-10-CM

## 2012-10-31 DIAGNOSIS — B351 Tinea unguium: Secondary | ICD-10-CM

## 2012-10-31 DIAGNOSIS — E669 Obesity, unspecified: Secondary | ICD-10-CM

## 2012-10-31 DIAGNOSIS — Z23 Encounter for immunization: Secondary | ICD-10-CM

## 2012-10-31 DIAGNOSIS — E1149 Type 2 diabetes mellitus with other diabetic neurological complication: Secondary | ICD-10-CM

## 2012-10-31 DIAGNOSIS — I10 Essential (primary) hypertension: Secondary | ICD-10-CM

## 2012-10-31 DIAGNOSIS — Z79899 Other long term (current) drug therapy: Secondary | ICD-10-CM

## 2012-10-31 DIAGNOSIS — E785 Hyperlipidemia, unspecified: Secondary | ICD-10-CM

## 2012-10-31 DIAGNOSIS — Z86010 Personal history of colonic polyps: Secondary | ICD-10-CM

## 2012-10-31 LAB — COMPREHENSIVE METABOLIC PANEL
ALT: 29 U/L (ref 0–53)
AST: 30 U/L (ref 0–37)
Albumin: 4 g/dL (ref 3.5–5.2)
Alkaline Phosphatase: 41 U/L (ref 39–117)
BUN: 14 mg/dL (ref 6–23)
Calcium: 9.7 mg/dL (ref 8.4–10.5)
Chloride: 103 mEq/L (ref 96–112)
Creatinine, Ser: 1 mg/dL (ref 0.4–1.5)
Sodium: 138 mEq/L (ref 135–145)
Total Bilirubin: 0.5 mg/dL (ref 0.3–1.2)
Total Protein: 7.1 g/dL (ref 6.0–8.3)

## 2012-10-31 LAB — MICROALBUMIN / CREATININE URINE RATIO
Creatinine,U: 121.1 mg/dL
Microalb Creat Ratio: 0.9 mg/g (ref 0.0–30.0)

## 2012-10-31 LAB — HEMOGLOBIN A1C: Hgb A1c MFr Bld: 7.6 % — ABNORMAL HIGH (ref 4.6–6.5)

## 2012-10-31 MED ORDER — TERBINAFINE HCL 250 MG PO TABS: 250.0000 mg | ORAL_TABLET | Freq: Every day | ORAL | Status: DC

## 2012-10-31 NOTE — Patient Instructions (Signed)
I am stopping your trilipix and Crestor for 3 months while we treat your toenail fungus.  Please stop them today and start the Lamisil in 2 weeks.  Return in 8 weeks for fasting labs.

## 2012-11-01 ENCOUNTER — Encounter: Payer: Self-pay | Admitting: Internal Medicine

## 2012-11-01 LAB — PTH, INTACT AND CALCIUM
Calcium: 9.9 mg/dL (ref 8.4–10.5)
PTH: 7.1 pg/mL — ABNORMAL LOW (ref 14.0–72.0)

## 2012-11-02 DIAGNOSIS — B351 Tinea unguium: Secondary | ICD-10-CM | POA: Insufficient documentation

## 2012-11-02 DIAGNOSIS — Z860100 Personal history of colon polyps, unspecified: Secondary | ICD-10-CM | POA: Insufficient documentation

## 2012-11-02 LAB — HM DIABETES FOOT EXAM

## 2012-11-02 NOTE — Assessment & Plan Note (Signed)
Well controlled on current regimen. Renal function stable, no proteinuria. No changes today.

## 2012-11-02 NOTE — Assessment & Plan Note (Addendum)
Referral for colonoscopy to Dr. Lemar Livings resulting in 5 yr follow up needed for 4 polyps found.

## 2012-11-02 NOTE — Assessment & Plan Note (Signed)
triglycerides were slightly elevated today.  LDL at goal on trilipix and Crestor. Trigs should improve with a low glycemic index diet and Exercise . repeat in 3 months.

## 2012-11-02 NOTE — Progress Notes (Signed)
Patient ID: [name] [name], male   DOB: 1940/07/16, 72 y.o.   MRN: [id number] Patient Active Problem List   Diagnosis Date Noted  . History of colonic polyps 11/02/2012  . FH: colonic polyps 04/29/2012  . OSA (obstructive sleep apnea) 01/28/2012  . Screening for colon cancer 01/20/2011  . Obesity (BMI 30-39.9) 01/20/2011  . Type II or unspecified type diabetes mellitus with neurological manifestations, not stated as uncontrolled(250.60)   . Hyperlipidemia   . Hypertension     Subjective:  CC:   Chief Complaint  Patient presents with  . Follow-up    3 month followup    HPI:   [name] Habib Meltonis a 72 y.o. male who presents  Past Medical History  Diagnosis Date  . Positive PPD, treated 1970  . Peripheral neuropathy 01/2007    positive EMG studies, negative workup for causes  . Diabetes mellitus     Type 2  . Hyperlipidemia   . Hypertension     Past Surgical History  Procedure Laterality Date  . Tonsillectomy    . Rotator cuff repair  2005    right (Dr. Mack Guise)  . Colonoscopy  2004       The following portions of the patient's history were reviewed and updated as appropriate: Allergies, current medications, and problem list.    Review of Systems:   12 Pt  review of systems was negative except those addressed in the HPI,     History   Social History  . Marital Status: Married    Spouse Name: N/A    Number of Children: N/A  . Years of Education: N/A   Occupational History  . Not on file.   Social History Main Topics  . Smoking status: Former Smoker    Types: Cigarettes    Quit date: 10/18/1980  . Smokeless tobacco: Former Neurosurgeon    Types: Chew    Quit date: 10/19/1990  . Alcohol Use: Yes     Comment: occasioanl  . Drug Use: No  . Sexual Activity: Not on file   Other Topics Concern  . Not on file   Social History Narrative  . No narrative on file    Objective:  Filed Vitals:   10/31/12 0840  BP: 112/70  Pulse: 90  Temp: 97.8 F (36.6  C)  Resp: 12     General appearance: alert, cooperative and appears stated age Ears: normal TM's and external ear canals both ears Throat: lips, mucosa, and tongue normal; teeth and gums normal Neck: no adenopathy, no carotid bruit, supple, symmetrical, trachea midline and thyroid not enlarged, symmetric, no tenderness/mass/nodules Back: symmetric, no curvature. ROM normal. No CVA tenderness. Lungs: clear to auscultation bilaterally Heart: regular rate and rhythm, S1, S2 normal, no murmur, click, rub or gallop Abdomen: soft, non-tender; bowel sounds normal; no masses,  no organomegaly Pulses: 2+ and symmetric Skin: Skin color, texture, turgor normal. No rashes or lesions Lymph nodes: Cervical, supraclavicular, and axillary nodes normal. Foot exam:  Nails are well trimmed,  No callouses,  Sensation intact to microfilament.  Onychomycosis of both great toenails   Assessment and Plan:  Type II or unspecified type diabetes mellitus with neurological manifestations, not stated as uncontrolled(250.60) Historically Well-controlled on current medications.  hemoglobin A1c has been consistently less than 7.0 but is slightly up . He is up-to-date on eye exams and his foot exam is normal . No proteinuria   Low glycemic index diet and wt loss recommended.  He is on the appropriate  medications.  Hyperlipidemia triglycerides were slightly elevated today.  LDL at goal on trilipix and Crestor. Trigs should improve with a low glycemic index diet and Exercise . repeat in 3 months.     Hypertension Well controlled on current regimen. Renal function stable, no proteinuria. No changes today.  Obesity (BMI 30-39.9) I have addressed  BMI and recommended wt loss of 10% of body weigh over the next 6 months using a low glycemic index diet and regular exercise a minimum of 5 days per week.    Screening for colon cancer Referral for colonoscopy to Dr. Lemar Livings resulting in 5 yr follow up needed for 4 polyps  found.   Onychomycosis of toenail Worsening,  With nailbed of both great toes significantly involved. Discussed treatment for 3 months with Lamisil requiring suspension of crestor and trilipix to avoid liver toxicity.  Return in 6 weeks for LFTs     Updated Medication List Outpatient Encounter Prescriptions as of 10/31/2012  Medication Sig Dispense Refill  . amitriptyline (ELAVIL) 50 MG tablet TAKE ONE TABLET AT BEDTIME  90 tablet  0  . amLODipine-benazepril (LOTREL) 10-40 MG per capsule TAKE ONE CAPSULE DAILY  90 capsule  0  . aspirin 81 MG tablet Take 81 mg by mouth daily.        . Choline Fenofibrate (TRILIPIX) 135 MG capsule Take 1 capsule (135 mg total) by mouth daily.  90 capsule  5  . co-enzyme Q-10 30 MG capsule Take 30 mg by mouth daily.        . fish oil-omega-3 fatty acids 1000 MG capsule Take 2 g by mouth daily.        Marland Kitchen glipiZIDE (GLUCOTROL) 5 MG tablet TAKE 1/2 TABLET TWICE A DAY WITH MEALS  90 tablet  3  . metFORMIN (GLUCOPHAGE) 1000 MG tablet TAKE ONE TABLET TWICE A DAY WITH MEALS.  180 tablet  1  . Multiple Vitamin (MULTIVITAMIN) tablet Take 1 tablet by mouth daily.        . rosuvastatin (CRESTOR) 40 MG tablet Take 1 tablet (40 mg total) by mouth daily.  30 tablet  5  . terbinafine (LAMISIL) 250 MG tablet Take 1 tablet (250 mg total) by mouth daily.  30 tablet  2   No facility-administered encounter medications on file as of 10/31/2012.

## 2012-11-02 NOTE — Assessment & Plan Note (Signed)
I have addressed  BMI and recommended wt loss of 10% of body weigh over the next 6 months using a low glycemic index diet and regular exercise a minimum of 5 days per week.   

## 2012-11-02 NOTE — Assessment & Plan Note (Signed)
Historically Well-controlled on current medications.  hemoglobin A1c has been consistently less than 7.0 but is slightly up . He is up-to-date on eye exams and his foot exam is normal . No proteinuria   Low glycemic index diet and wt loss recommended.  He is on the appropriate medications.

## 2012-11-02 NOTE — Assessment & Plan Note (Signed)
Worsening,  With nailbed of both great toes significantly involved. Discussed treatment for 3 months with Lamisil requiring suspension of crestor and trilipix to avoid liver toxicity.  Return in 6 weeks for LFTs

## 2012-12-11 ENCOUNTER — Telehealth: Payer: Self-pay | Admitting: Internal Medicine

## 2012-12-11 MED ORDER — GABAPENTIN 300 MG PO CAPS: 300.0000 mg | ORAL_CAPSULE | Freq: Every day | ORAL | Status: DC

## 2012-12-11 NOTE — Telephone Encounter (Signed)
His insurance is requiring that we stop his amitriptyline , even though it is working,  Because it is not recommended for patients over 65.    Gabapentin 300 mg at bedtime is what they are asking me to prescribe (it is generic for Neurontin) . Is he agreeable to the change?

## 2012-12-11 NOTE — Telephone Encounter (Signed)
Patient says he is agreeable if you advise this change and please advise if he needs to ween off the amitriptyline.

## 2012-12-11 NOTE — Telephone Encounter (Signed)
He will need to wean off the amitriptyline,  By reducing dose to 25 mg nightly for 2 weeks , then 25 mg every other night for 2 weeks then stop  Cant start the gabapentin once he reduces the dose to 25 mg

## 2012-12-12 NOTE — Telephone Encounter (Signed)
Patient notified and voiced understanding of instructions  

## 2012-12-29 ENCOUNTER — Other Ambulatory Visit (INDEPENDENT_AMBULATORY_CARE_PROVIDER_SITE_OTHER): Payer: Medicare Other

## 2012-12-29 DIAGNOSIS — E785 Hyperlipidemia, unspecified: Secondary | ICD-10-CM

## 2012-12-29 DIAGNOSIS — B351 Tinea unguium: Secondary | ICD-10-CM

## 2012-12-29 DIAGNOSIS — Z79899 Other long term (current) drug therapy: Secondary | ICD-10-CM

## 2012-12-29 LAB — LIPID PANEL
HDL: 43.6 mg/dL (ref >39.00)
Total CHOL/HDL Ratio: 4
Triglycerides: 223 mg/dL — ABNORMAL HIGH (ref 0.0–149.0)
VLDL: 44.6 mg/dL — ABNORMAL HIGH (ref 0.0–40.0)

## 2012-12-29 LAB — HEPATIC FUNCTION PANEL
AST: 25 U/L (ref 0–37)
Albumin: 4 g/dL (ref 3.5–5.2)
Alkaline Phosphatase: 50 U/L (ref 39–117)
Bilirubin, Direct: 0 mg/dL (ref 0.0–0.3)
Total Bilirubin: 0.6 mg/dL (ref 0.3–1.2)
Total Protein: 7 g/dL (ref 6.0–8.3)

## 2012-12-29 LAB — LDL CHOLESTEROL, DIRECT: Direct LDL: 87.5 mg/dL

## 2013-01-01 ENCOUNTER — Encounter: Payer: Self-pay | Admitting: Internal Medicine

## 2013-01-12 ENCOUNTER — Other Ambulatory Visit: Payer: Self-pay | Admitting: Internal Medicine

## 2013-01-16 LAB — HM DIABETES EYE EXAM

## 2013-03-05 ENCOUNTER — Other Ambulatory Visit: Payer: Self-pay | Admitting: Internal Medicine

## 2013-04-04 ENCOUNTER — Other Ambulatory Visit: Payer: Self-pay | Admitting: Internal Medicine

## 2013-04-16 ENCOUNTER — Ambulatory Visit (INDEPENDENT_AMBULATORY_CARE_PROVIDER_SITE_OTHER): Payer: Medicare Other | Admitting: Internal Medicine

## 2013-04-16 ENCOUNTER — Encounter: Payer: Self-pay | Admitting: Internal Medicine

## 2013-04-16 VITALS — BP 128/76 | HR 85 | Temp 97.6°F | Resp 18 | Wt 229.0 lb

## 2013-04-16 DIAGNOSIS — E669 Obesity, unspecified: Secondary | ICD-10-CM

## 2013-04-16 DIAGNOSIS — I10 Essential (primary) hypertension: Secondary | ICD-10-CM

## 2013-04-16 DIAGNOSIS — Z23 Encounter for immunization: Secondary | ICD-10-CM

## 2013-04-16 DIAGNOSIS — E785 Hyperlipidemia, unspecified: Secondary | ICD-10-CM

## 2013-04-16 DIAGNOSIS — E1149 Type 2 diabetes mellitus with other diabetic neurological complication: Secondary | ICD-10-CM

## 2013-04-16 LAB — COMPREHENSIVE METABOLIC PANEL
ALBUMIN: 4 g/dL (ref 3.5–5.2)
ALK PHOS: 41 U/L (ref 39–117)
ALT: 31 U/L (ref 0–53)
AST: 29 U/L (ref 0–37)
BILIRUBIN TOTAL: 0.4 mg/dL (ref 0.3–1.2)
BUN: 16 mg/dL (ref 6–23)
CO2: 27 mEq/L (ref 19–32)
Calcium: 9.2 mg/dL (ref 8.4–10.5)
Chloride: 104 mEq/L (ref 96–112)
Creatinine, Ser: 1 mg/dL (ref 0.4–1.5)
GFR: 78.3 mL/min (ref >60.00)
Glucose, Bld: 139 mg/dL — ABNORMAL HIGH (ref 70–99)
Potassium: 4.6 mEq/L (ref 3.5–5.1)
Sodium: 138 mEq/L (ref 135–145)
Total Protein: 6.7 g/dL (ref 6.0–8.3)

## 2013-04-16 LAB — LIPID PANEL
CHOL/HDL RATIO: 3
Cholesterol: 138 mg/dL (ref 0–200)
HDL: 43.1 mg/dL (ref >39.00)
LDL Cholesterol: 64 mg/dL (ref 0–99)
Triglycerides: 157 mg/dL — ABNORMAL HIGH (ref 0.0–149.0)
VLDL: 31.4 mg/dL (ref 0.0–40.0)

## 2013-04-16 LAB — MICROALBUMIN / CREATININE URINE RATIO
CREATININE, U: 179.7 mg/dL
MICROALB UR: 0.9 mg/dL (ref 0.0–1.9)
Microalb Creat Ratio: 0.5 mg/g (ref 0.0–30.0)

## 2013-04-16 LAB — HEMOGLOBIN A1C: Hgb A1c MFr Bld: 7.2 % — ABNORMAL HIGH (ref 4.6–6.5)

## 2013-04-16 NOTE — Assessment & Plan Note (Signed)
I have addressed  BMI and recommended wt loss of 10% of body weigh over the next 6 months using a low glycemic index diet and regular exercise a minimum of 5 days per week.   

## 2013-04-16 NOTE — Progress Notes (Signed)
Patient ID: [name] [name], male   DOB: 1940/12/27, 73 y.o.   MRN: [id number]  Patient Active Problem List   Diagnosis Date Noted  . History of colonic polyps 11/02/2012  . Onychomycosis of toenail 11/02/2012  . FH: colonic polyps 04/29/2012  . OSA (obstructive sleep apnea) 01/28/2012  . Screening for colon cancer 01/20/2011  . Obesity (BMI 30-39.9) 01/20/2011  . Type II or unspecified type diabetes mellitus with neurological manifestations, not stated as uncontrolled(250.60)   . Hyperlipidemia   . Hypertension     Subjective:  CC:   Chief Complaint  Patient presents with  . Follow-up  . Diabetes    HPI:   [name] [name] is a 73 y.o. male who presents for follow up on diabetes mellitus, hyperlipidemia, and hypertension. He has been taking all of his medications as directed. He is tolerating all his medications without myalgias or hypoglycemic events. He does not check his blood sugars. He has no complaints.   annual eye exams are up-to-date. He has seen his cardiologist, Dr. Nehemiah Massed, for six-month followup and no changes were made to medications. He is sleeping well. DM follow up,   toerating gabapentin, forced to change from elavil due to insurance.     right hip pain after sitting on bleachers   resolved with chiropractor.  Bursitis.    Three-month followup on diabetes mellitus, hyperlipidemia, and hypertension. He has been taking all of his medications as directed. He is tolerating all his medications without myalgias or hypoglycemic events. He does not check his blood sugars. He has no complaints.   annual eye exams are up-to-date. He has seen his cardiologist, Dr. Nehemiah Massed, for six-month followup and no changes were made to medications. He is sleeping well.  He is tolerating  change in medication from amitriptyline to  gabapentin, forced to change from elavil due to insurance.  He had recently experienced some   right hip pain after prolonged  sitting on bleachers   resolved with  chiropractor.  Bursitis.       Past Medical History  Diagnosis Date  . Positive PPD, treated 1970  . Peripheral neuropathy 01/2007    positive EMG studies, negative workup for causes  . Diabetes mellitus     Type 2  . Hyperlipidemia   . Hypertension     Past Surgical History  Procedure Laterality Date  . Tonsillectomy    . Rotator cuff repair  2005    right (Dr. Francia Greaves)  . Colonoscopy  2004       The following portions of the patient's history were reviewed and updated as appropriate: Allergies, current medications, and problem list.    Review of Systems:   Patient denies headache, fevers, malaise, unintentional weight loss, skin rash, eye pain, sinus congestion and sinus pain, sore throat, dysphagia,  hemoptysis , cough, dyspnea, wheezing, chest pain, palpitations, orthopnea, edema, abdominal pain, nausea, melena, diarrhea, constipation, flank pain, dysuria, hematuria, urinary  Frequency, nocturia, numbness, tingling, seizures,  Focal weakness, Loss of consciousness,  Tremor, insomnia, depression, anxiety, and suicidal ideation.     History   Social History  . Marital Status: Married    Spouse Name: N/A    Number of Children: N/A  . Years of Education: N/A   Occupational History  . Not on file.   Social History Main Topics  . Smoking status: Former Smoker    Types: Cigarettes    Quit date: 10/18/1980  . Smokeless tobacco: Former Systems developer    Types: Loss adjuster, chartered  Quit date: 10/19/1990  . Alcohol Use: Yes     Comment: occasioanl  . Drug Use: No  . Sexual Activity: Not on file   Other Topics Concern  . Not on file   Social History Narrative  . No narrative on file    Objective:  Filed Vitals:   04/16/13 0802  BP: 128/76  Pulse: 85  Temp: 97.6 F (36.4 C)  Resp: 18     General appearance: alert, cooperative and appears stated age Ears: normal TM's and external ear canals both ears Throat: lips, mucosa, and tongue normal; teeth and gums normal Neck: no  adenopathy, no carotid bruit, supple, symmetrical, trachea midline and thyroid not enlarged, symmetric, no tenderness/mass/nodules Back: symmetric, no curvature. ROM normal. No CVA tenderness. Lungs: clear to auscultation bilaterally Heart: regular rate and rhythm, S1, S2 normal, no murmur, click, rub or gallop Abdomen: soft, non-tender; bowel sounds normal; no masses,  no organomegaly Pulses: 2+ and symmetric Skin: Skin color, texture, turgor normal. No rashes or lesions Lymph nodes: Cervical, supraclavicular, and axillary nodes normal.  Assessment and Plan:  Type II or unspecified type diabetes mellitus with neurological manifestations, not stated as uncontrolled(250.60) Historically Well-controlled on current medications, but last  A1c was elevated at 7.6.  Repeating today is better at 7.2  Lab Results  Component Value Date   HGBA1C 7.2* 04/16/2013     . He is up-to-date on eye exams and his foot exam is abnormal secondary to decreased sensation and onychomycosis.  Marland Kitchen No proteinuria   Low glycemic index diet and wt loss recommended.  He is on the appropriate medications.    Hypertension Well controlled on current regimen. Renal function stable, no changes today.  Lab Results  Component Value Date   CREATININE 1.0 04/16/2013   Lab Results  Component Value Date   NA 138 04/16/2013   K 4.6 04/16/2013   CL 104 04/16/2013   CO2 27 04/16/2013     Hyperlipidemia triglycerides were slightly elevated today.  LDL at goal on trilipix and Crestor. Trigs should improve with a low glycemic index diet and Exercise . repeat in 3 months.     Lab Results  Component Value Date   CHOL 138 04/16/2013   HDL 43.10 04/16/2013   LDLCALC 64 04/16/2013   LDLDIRECT 87.5 12/29/2012   TRIG 157.0* 04/16/2013   CHOLHDL 3 04/16/2013   Lab Results  Component Value Date   ALT 31 04/16/2013   AST 29 04/16/2013   ALKPHOS 41 04/16/2013   BILITOT 0.4 04/16/2013     Obesity (BMI 30-39.9) I have addressed   BMI and recommended wt loss of 10% of body weigh over the next 6 months using a low glycemic index diet and regular exercise a minimum of 5 days per week.     Updated Medication List Outpatient Encounter Prescriptions as of 04/16/2013  Medication Sig  . amLODipine-benazepril (LOTREL) 10-40 MG per capsule TAKE ONE CAPSULE DAILY  . aspirin 81 MG tablet Take 81 mg by mouth daily.    . Choline Fenofibrate (TRILIPIX) 135 MG capsule Take 1 capsule (135 mg total) by mouth daily.  Marland Kitchen co-enzyme Q-10 30 MG capsule Take 30 mg by mouth daily.    . CRESTOR 40 MG tablet TAKE 1 TABLET EVERY DAY USUALLY IN THE EVENING  . fish oil-omega-3 fatty acids 1000 MG capsule Take 2 g by mouth daily.    Marland Kitchen gabapentin (NEURONTIN) 300 MG capsule TAKE ONE CAPSULE AT BEDTIME  . glipiZIDE (GLUCOTROL)  5 MG tablet TAKE 1/2 TABLET TWICE A DAY WITH MEALS  . metFORMIN (GLUCOPHAGE) 1000 MG tablet TAKE ONE TABLET TWICE A DAY WITH MEALS.  . Multiple Vitamin (MULTIVITAMIN) tablet Take 1 tablet by mouth daily.    Marland Kitchen terbinafine (LAMISIL) 250 MG tablet Take 1 tablet (250 mg total) by mouth daily.  . [DISCONTINUED] amitriptyline (ELAVIL) 50 MG tablet TAKE ONE TABLET AT BEDTIME     Orders Placed This Encounter  Procedures  . Pneumococcal conjugate vaccine 13-valent  . Hemoglobin A1c  . Lipid panel  . Microalbumin / creatinine urine ratio  . Comprehensive metabolic panel  . HM DIABETES EYE EXAM    No Follow-up on file.

## 2013-04-16 NOTE — Patient Instructions (Signed)
Your Body mass Index is 33  Goal is < 30 ( 208 lbs)  To avoid "obesity tax"   If you Ac is < 7.0 today, I'll  see you in 6 months . If over 7.4,  3 months.     You received your pneumonia vaccine today

## 2013-04-16 NOTE — Assessment & Plan Note (Signed)
Well controlled on current regimen. Renal function stable, no changes today.  Lab Results  Component Value Date   CREATININE 1.0 04/16/2013   Lab Results  Component Value Date   NA 138 04/16/2013   K 4.6 04/16/2013   CL 104 04/16/2013   CO2 27 04/16/2013

## 2013-04-16 NOTE — Assessment & Plan Note (Addendum)
Historically Well-controlled on current medications, but last  A1c was elevated at 7.6.  Repeating today is better at 7.2  Lab Results  Component Value Date   HGBA1C 7.2* 04/16/2013     . He is up-to-date on eye exams and his foot exam is abnormal secondary to decreased sensation and onychomycosis.  Marland Kitchen No proteinuria   Low glycemic index diet and wt loss recommended.  He is on the appropriate medications.

## 2013-04-16 NOTE — Progress Notes (Signed)
Pre-visit discussion using our clinic review tool. No additional management support is needed unless otherwise documented below in the visit note.  

## 2013-04-16 NOTE — Assessment & Plan Note (Signed)
triglycerides were slightly elevated today.  LDL at goal on trilipix and Crestor. Trigs should improve with a low glycemic index diet and Exercise . repeat in 3 months.     Lab Results  Component Value Date   CHOL 138 04/16/2013   HDL 43.10 04/16/2013   LDLCALC 64 04/16/2013   LDLDIRECT 87.5 12/29/2012   TRIG 157.0* 04/16/2013   CHOLHDL 3 04/16/2013   Lab Results  Component Value Date   ALT 31 04/16/2013   AST 29 04/16/2013   ALKPHOS 41 04/16/2013   BILITOT 0.4 04/16/2013

## 2013-04-17 ENCOUNTER — Telehealth: Payer: Self-pay | Admitting: Internal Medicine

## 2013-04-17 NOTE — Telephone Encounter (Signed)
Relevant patient education assigned to patient using Emmi. ° °

## 2013-04-18 ENCOUNTER — Other Ambulatory Visit: Payer: Self-pay | Admitting: Internal Medicine

## 2013-04-18 NOTE — Telephone Encounter (Signed)
Mailed unread message to pt  

## 2013-04-27 ENCOUNTER — Telehealth: Payer: Self-pay

## 2013-04-27 NOTE — Telephone Encounter (Signed)
Relevant patient education assigned to patient using Emmi. ° °

## 2013-05-07 ENCOUNTER — Other Ambulatory Visit: Payer: Self-pay | Admitting: Internal Medicine

## 2013-06-05 ENCOUNTER — Other Ambulatory Visit: Payer: Self-pay | Admitting: Internal Medicine

## 2013-07-04 ENCOUNTER — Other Ambulatory Visit: Payer: Self-pay | Admitting: Internal Medicine

## 2013-07-18 ENCOUNTER — Other Ambulatory Visit: Payer: Self-pay | Admitting: Internal Medicine

## 2013-08-23 ENCOUNTER — Ambulatory Visit (INDEPENDENT_AMBULATORY_CARE_PROVIDER_SITE_OTHER): Payer: Medicare Other | Admitting: Internal Medicine

## 2013-08-23 ENCOUNTER — Encounter: Payer: Self-pay | Admitting: Internal Medicine

## 2013-08-23 VITALS — BP 108/60 | HR 74 | Temp 98.6°F | Resp 12 | Wt 218.5 lb

## 2013-08-23 DIAGNOSIS — E785 Hyperlipidemia, unspecified: Secondary | ICD-10-CM

## 2013-08-23 DIAGNOSIS — G4733 Obstructive sleep apnea (adult) (pediatric): Secondary | ICD-10-CM

## 2013-08-23 DIAGNOSIS — E1149 Type 2 diabetes mellitus with other diabetic neurological complication: Secondary | ICD-10-CM

## 2013-08-23 DIAGNOSIS — I10 Essential (primary) hypertension: Secondary | ICD-10-CM

## 2013-08-23 LAB — HEMOGLOBIN A1C: Hgb A1c MFr Bld: 7.1 % — ABNORMAL HIGH (ref 4.6–6.5)

## 2013-08-23 LAB — COMPREHENSIVE METABOLIC PANEL
ALK PHOS: 56 U/L (ref 39–117)
ALT: 40 U/L (ref 0–53)
AST: 39 U/L — ABNORMAL HIGH (ref 0–37)
Albumin: 4 g/dL (ref 3.5–5.2)
BILIRUBIN TOTAL: 0.6 mg/dL (ref 0.2–1.2)
BUN: 19 mg/dL (ref 6–23)
CO2: 26 meq/L (ref 19–32)
CREATININE: 1 mg/dL (ref 0.4–1.5)
Calcium: 9.9 mg/dL (ref 8.4–10.5)
Chloride: 102 mEq/L (ref 96–112)
GFR: 74.76 mL/min (ref >60.00)
Glucose, Bld: 79 mg/dL (ref 70–99)
Potassium: 4.9 mEq/L (ref 3.5–5.1)
Sodium: 136 mEq/L (ref 135–145)
Total Protein: 6.8 g/dL (ref 6.0–8.3)

## 2013-08-23 LAB — MICROALBUMIN / CREATININE URINE RATIO
Creatinine,U: 191.2 mg/dL
MICROALB UR: 1.1 mg/dL (ref 0.0–1.9)
MICROALB/CREAT RATIO: 0.6 mg/g (ref 0.0–30.0)

## 2013-08-23 NOTE — Patient Instructions (Addendum)
You are doing well.  If your A1c is close to 7.0,  We will see you in 6 months   Diabetes and Sick Day Management Blood sugar (glucose) can be more difficult to control when you are sick. Colds, fever, flu, nausea, vomiting, and diarrhea are all examples of common illnesses that can cause problems for people with diabetes. Loss of body fluids (dehydration) from fever, vomiting, diarrhea, infection, and the stress of a sickness can all cause blood glucose levels to increase. Because of this, it is very important to take your diabetes medicines and to eat some form of carbohydrate food when you are sick. Liquid or soft foods are often tolerated, and they help to replace fluids. HOME CARE INSTRUCTIONS These main guidelines are intended for managing a short-term (24 hours or less) sickness:  Take your usual dose of insulin or oral diabetes medicine. An exception would be if you take any form of metformin. If you cannot eat or drink, you can become dehydrated and should not take this medicine.  Continue to take your insulin even if you are unable to eat solid foods or are vomiting. Your insulin dose may stay the same, or it may need to be increased when you are sick.  You will need to test your blood glucose more often, generally every 2-4 hours. If you have type 1 diabetes, test your urine for ketones every 4 hours. If you have type 2 diabetes, test your urine for ketones as directed by your health care provider.  Eat some form of food that contains carbohydrates. The carbohydrates can be in solid or liquid form. You should eat 45-50 g of carbohydrates every 3-4 hours.  Replace fluids if you have a fever, vomit, or have diarrhea. Ask your health care provider for specific rehydration instructions.  Watch carefully for the signs of ketoacidosis if you have type 1 diabetes. Call your health care provider if any of the following symptoms are present, especially in children:  Moderate to large ketones  in the urine along with a high blood glucose level.  Severe nausea.  Vomiting.  Diarrhea.  Abdominal pain.  Rapid breathing.  Drink extra liquids that do not contain sugar such as water.  Be careful with over-the-counter medicines. Read the labels. They may contain sugar or types of sugars that can increase your blood glucose level. Food Choices for Illness All of the food choices below contain about 15 g of carbohydrates. Plan ahead and keep some of these foods around.    to  cup carbonated beverage containing sugar. Carbonated beverages will usually be better tolerated if they are opened and left at room temperature for a few minutes.   of a twin frozen ice pop.   cup regular gelatin.   cup juice.   cup ice cream or frozen yogurt.   cup cooked cereal.   cup sherbet.  1 cup clear broth or soup.  1 cup cream soup.   cup regular custard.   cup regular pudding.  1 cup sports drink.  1 cup plain yogurt.  1 slice toast.  6 squares saltine crackers.  5 vanilla wafers. SEEK MEDICAL CARE IF:   You are unable to drink fluids, even small amounts.  You have nausea and vomiting for more than 6 hours.  You have diarrhea for more than 6 hours.  Your blood glucose level is more than 240 mg/dL, even with additional insulin.  There is a change in mental status.  You develop an additional  serious sickness.  You have been sick for 2 days and are not getting better.  You have a fever. SEEK IMMEDIATE MEDICAL CARE IF:  You have difficulty breathing.  You have moderate to large ketone levels. MAKE SURE YOU:  Understand these instructions.  Will watch your condition.  Will get help right away if you are not doing well or get worse. Document Released: 01/07/2003 Document Revised: 05/21/2013 Document Reviewed: 06/13/2012 [facility] Patient Information 2015 [place], [place]. This information is not intended to replace advice given to you by your health care  provider. Make sure you discuss any questions you have with your health care provider.

## 2013-08-23 NOTE — Progress Notes (Signed)
Patient ID: [name] [name], male   DOB: [birth year], 73 y.o.   MRN: [id number]  Patient Active Problem List   Diagnosis Date Noted  . History of colonic polyps 11/02/2012  . Onychomycosis of toenail 11/02/2012  . FH: colonic polyps 04/29/2012  . OSA (obstructive sleep apnea) 01/28/2012  . Screening for colon cancer 01/20/2011  . Obesity (BMI 30-39.9) 01/20/2011  . Type II or unspecified type diabetes mellitus with neurological manifestations, not stated as uncontrolled(250.60)   . Hyperlipidemia   . Hypertension     Subjective:  CC:   Chief Complaint  Patient presents with  . Follow-up    4 month followup    HPI:   [name] [name] is a 73 y.o. male who presents for Follow up on chronic conditions including DM Type 2, hypertension,  Obesity wit OSA and hyperlipidemia.   He had an episode of allergic conjuncitivitis was treated last week by optometry with topical eye drops,  Left eye sympotms resolved.  No current symptoms.   Taking medications as directed.  Following a modified low carb diet.  Does not check blood sugars   He is using CPAP nightly for OSA    Past Medical History  Diagnosis Date  . Positive PPD, treated 1970  . Peripheral neuropathy 01/2007    positive EMG studies, negative workup for causes  . Diabetes mellitus     Type 2  . Hyperlipidemia   . Hypertension     Past Surgical History  Procedure Laterality Date  . Tonsillectomy    . Rotator cuff repair  2005    right (Dr. Francia Greaves)  . Colonoscopy  2004       The following portions of the patient's history were reviewed and updated as appropriate: Allergies, current medications, and problem list.    Review of Systems:   Patient denies headache, fevers, malaise, unintentional weight loss, skin rash, eye pain, sinus congestion and sinus pain, sore throat, dysphagia,  hemoptysis , cough, dyspnea, wheezing, chest pain, palpitations, orthopnea, edema, abdominal pain, nausea, melena, diarrhea,  constipation, flank pain, dysuria, hematuria, urinary  Frequency, nocturia, numbness, tingling, seizures,  Focal weakness, Loss of consciousness,  Tremor, insomnia, depression, anxiety, and suicidal ideation.     History   Social History  . Marital Status: Married    Spouse Name: N/A    Number of Children: N/A  . Years of Education: N/A   Occupational History  . Not on file.   Social History Main Topics  . Smoking status: Former Smoker    Types: Cigarettes    Quit date: 10/18/1980  . Smokeless tobacco: Former Systems developer    Types: [place] date: 10/19/1990  . Alcohol Use: Yes     Comment: occasioanl  . Drug Use: No  . Sexual Activity: Not on file   Other Topics Concern  . Not on file   Social History Narrative  . No narrative on file    Objective:  Filed Vitals:   08/23/13 1358  BP: 108/60  Pulse: 74  Temp: 98.6 F (37 C)  Resp: 12     General appearance: alert, cooperative and appears stated age Ears: normal TM's and external ear canals both ears Throat: lips, mucosa, and tongue normal; teeth and gums normal Neck: no adenopathy, no carotid bruit, supple, symmetrical, trachea midline and thyroid not enlarged, symmetric, no tenderness/mass/nodules Back: symmetric, no curvature. ROM normal. No CVA tenderness. Lungs: clear to auscultation bilaterally Heart: regular rate and rhythm, S1, S2  normal, no murmur, click, rub or gallop Abdomen: soft, non-tender; bowel sounds normal; no masses,  no organomegaly Pulses: 2+ and symmetric Skin: Skin color, texture, turgor normal. No rashes or lesions Lymph nodes: Cervical, supraclavicular, and axillary nodes normal.  Assessment and Plan:  Hypertension Well controlled on current regimen. Renal function stable, no changes today.  Lab Results  Component Value Date   CREATININE 1.0 08/23/2013   Lab Results  Component Value Date   NA 136 08/23/2013   K 4.9 08/23/2013   CL 102 08/23/2013   CO2 26 08/23/2013     OSA  (obstructive sleep apnea) Diagnosed by sleep study. he is wearing her CPAP every night a minimum of 6 hours per night and notes improved daytime wakefulness and decreased fatigue   Type II or unspecified type diabetes mellitus with neurological manifestations, not stated as uncontrolled(250.60) Improving control with low GI diet and exercise .  hemoglobin A1c is now 7.1 . Patient is up to date on eye exam and foot exam was done today.  There is  no proteinuria on prior micro urinalysis .   Lab Results  Component Value Date   HGBA1C 7.1* 08/23/2013   Lab Results  Component Value Date   MICROALBUR 1.1 08/23/2013      Hyperlipidemia LDL and triglycerides are at goal on current medications. He has no side effects and liver enzymes are normal. No changes today   Lab Results  Component Value Date   CHOL 138 04/16/2013   HDL 43.10 04/16/2013   LDLCALC 64 04/16/2013   LDLDIRECT 87.5 12/29/2012   TRIG 157.0* 04/16/2013   CHOLHDL 3 04/16/2013   Lab Results  Component Value Date   ALT 40 08/23/2013   AST 39* 08/23/2013   ALKPHOS 56 08/23/2013   BILITOT 0.6 08/23/2013      Updated Medication List Outpatient Encounter Prescriptions as of 08/23/2013  Medication Sig  . amLODipine-benazepril (LOTREL) 10-40 MG per capsule TAKE ONE CAPSULE DAILY  . aspirin 81 MG tablet Take 81 mg by mouth daily.    . Choline Fenofibrate (FENOFIBRIC ACID) 135 MG CPDR TAKE ONE CAPSULE DAILY  . co-enzyme Q-10 30 MG capsule Take 30 mg by mouth daily.    . CRESTOR 40 MG tablet TAKE 1 TABLET EVERY DAY USUALLY IN THE EVENING  . fish oil-omega-3 fatty acids 1000 MG capsule Take 2 g by mouth daily.    Marland Kitchen gabapentin (NEURONTIN) 300 MG capsule TAKE ONE CAPSULE AT BEDTIME  . glipiZIDE (GLUCOTROL) 5 MG tablet TAKE 1/2 TABLET TWICE A DAY WITH MEALS  . metFORMIN (GLUCOPHAGE) 1000 MG tablet TAKE ONE TABLET TWICE A DAY WITH MEALS.  . Multiple Vitamin (MULTIVITAMIN) tablet Take 1 tablet by mouth daily.    . [DISCONTINUED] terbinafine  (LAMISIL) 250 MG tablet Take 1 tablet (250 mg total) by mouth daily.     Orders Placed This Encounter  Procedures  . Hemoglobin A1c  . Comprehensive metabolic panel  . Microalbumin / creatinine urine ratio    No Follow-up on file.

## 2013-08-23 NOTE — Progress Notes (Signed)
Pre visit review using our clinic review tool, if applicable. No additional management support is needed unless otherwise documented below in the visit note. 

## 2013-08-24 ENCOUNTER — Ambulatory Visit: Payer: Medicare Other | Admitting: Internal Medicine

## 2013-08-26 ENCOUNTER — Encounter: Payer: Self-pay | Admitting: Internal Medicine

## 2013-08-26 NOTE — Assessment & Plan Note (Signed)
Well controlled on current regimen. Renal function stable, no changes today.  Lab Results  Component Value Date   CREATININE 1.0 08/23/2013   Lab Results  Component Value Date   NA 136 08/23/2013   K 4.9 08/23/2013   CL 102 08/23/2013   CO2 26 08/23/2013

## 2013-08-26 NOTE — Assessment & Plan Note (Signed)
Diagnosed by sleep study. he is wearing her CPAP every night a minimum of 6 hours per night and notes improved daytime wakefulness and decreased fatigue  

## 2013-08-26 NOTE — Assessment & Plan Note (Signed)
LDL and triglycerides are at goal on current medications. He has no side effects and liver enzymes are normal. No changes today   Lab Results  Component Value Date   CHOL 138 04/16/2013   HDL 43.10 04/16/2013   LDLCALC 64 04/16/2013   LDLDIRECT 87.5 12/29/2012   TRIG 157.0* 04/16/2013   CHOLHDL 3 04/16/2013   Lab Results  Component Value Date   ALT 40 08/23/2013   AST 39* 08/23/2013   ALKPHOS 56 08/23/2013   BILITOT 0.6 08/23/2013

## 2013-08-26 NOTE — Assessment & Plan Note (Addendum)
Improving control with low GI diet and exercise .  hemoglobin A1c is now 7.1 . Patient is up to date on eye exam and foot exam was done today.  There is  no proteinuria on prior micro urinalysis .   Lab Results  Component Value Date   HGBA1C 7.1* 08/23/2013   Lab Results  Component Value Date   MICROALBUR 1.1 08/23/2013

## 2013-08-27 ENCOUNTER — Encounter: Payer: Self-pay | Admitting: Internal Medicine

## 2013-08-27 ENCOUNTER — Ambulatory Visit: Payer: Medicare Other | Admitting: Internal Medicine

## 2013-09-12 ENCOUNTER — Other Ambulatory Visit: Payer: Self-pay | Admitting: Internal Medicine

## 2013-10-10 ENCOUNTER — Other Ambulatory Visit: Payer: Self-pay | Admitting: Internal Medicine

## 2013-10-13 ENCOUNTER — Ambulatory Visit (INDEPENDENT_AMBULATORY_CARE_PROVIDER_SITE_OTHER): Payer: Medicare Other

## 2013-10-13 DIAGNOSIS — Z23 Encounter for immunization: Secondary | ICD-10-CM

## 2013-10-17 ENCOUNTER — Other Ambulatory Visit: Payer: Self-pay | Admitting: Internal Medicine

## 2013-11-07 ENCOUNTER — Other Ambulatory Visit: Payer: Self-pay | Admitting: Internal Medicine

## 2013-12-04 LAB — HM DIABETES EYE EXAM

## 2013-12-10 ENCOUNTER — Other Ambulatory Visit: Payer: Self-pay | Admitting: Internal Medicine

## 2013-12-17 ENCOUNTER — Encounter: Payer: Self-pay | Admitting: *Deleted

## 2013-12-26 ENCOUNTER — Encounter: Payer: Self-pay | Admitting: Internal Medicine

## 2014-01-31 ENCOUNTER — Other Ambulatory Visit: Payer: Self-pay | Admitting: Internal Medicine

## 2014-02-27 ENCOUNTER — Encounter: Payer: Self-pay | Admitting: Internal Medicine

## 2014-02-27 ENCOUNTER — Ambulatory Visit (INDEPENDENT_AMBULATORY_CARE_PROVIDER_SITE_OTHER): Payer: Commercial Managed Care - PPO | Admitting: Internal Medicine

## 2014-02-27 VITALS — BP 118/70 | HR 93 | Temp 97.4°F | Resp 14 | Ht 68.25 in | Wt 232.8 lb

## 2014-02-27 DIAGNOSIS — E669 Obesity, unspecified: Secondary | ICD-10-CM

## 2014-02-27 DIAGNOSIS — Z0000 Encounter for general adult medical examination without abnormal findings: Secondary | ICD-10-CM

## 2014-02-27 DIAGNOSIS — E559 Vitamin D deficiency, unspecified: Secondary | ICD-10-CM

## 2014-02-27 DIAGNOSIS — I10 Essential (primary) hypertension: Secondary | ICD-10-CM

## 2014-02-27 DIAGNOSIS — B351 Tinea unguium: Secondary | ICD-10-CM

## 2014-02-27 DIAGNOSIS — E1142 Type 2 diabetes mellitus with diabetic polyneuropathy: Secondary | ICD-10-CM

## 2014-02-27 DIAGNOSIS — Z125 Encounter for screening for malignant neoplasm of prostate: Secondary | ICD-10-CM

## 2014-02-27 DIAGNOSIS — Z1159 Encounter for screening for other viral diseases: Secondary | ICD-10-CM

## 2014-02-27 DIAGNOSIS — E785 Hyperlipidemia, unspecified: Secondary | ICD-10-CM

## 2014-02-27 LAB — COMPREHENSIVE METABOLIC PANEL
ALK PHOS: 55 U/L (ref 39–117)
ALT: 22 U/L (ref 0–53)
AST: 20 U/L (ref 0–37)
Albumin: 4.4 g/dL (ref 3.5–5.2)
BUN: 25 mg/dL — AB (ref 6–23)
CO2: 28 mEq/L (ref 19–32)
Calcium: 10.6 mg/dL — ABNORMAL HIGH (ref 8.4–10.5)
Chloride: 103 mEq/L (ref 96–112)
Creatinine, Ser: 0.84 mg/dL (ref 0.40–1.50)
GFR: 95.51 mL/min (ref >60.00)
Glucose, Bld: 135 mg/dL — ABNORMAL HIGH (ref 70–99)
POTASSIUM: 5 meq/L (ref 3.5–5.1)
Sodium: 138 mEq/L (ref 135–145)
Total Bilirubin: 0.4 mg/dL (ref 0.2–1.2)
Total Protein: 6.8 g/dL (ref 6.0–8.3)

## 2014-02-27 LAB — LIPID PANEL
Cholesterol: 148 mg/dL (ref 0–200)
HDL: 45.7 mg/dL (ref >39.00)
LDL Cholesterol: 74 mg/dL (ref 0–99)
NONHDL: 102.3
Total CHOL/HDL Ratio: 3
Triglycerides: 143 mg/dL (ref 0.0–149.0)
VLDL: 28.6 mg/dL (ref 0.0–40.0)

## 2014-02-27 LAB — HM DIABETES FOOT EXAM: HM DIABETIC FOOT EXAM: DECREASED

## 2014-02-27 LAB — PSA, MEDICARE: PSA: 1.23 ng/mL (ref 0.10–4.00)

## 2014-02-27 LAB — VITAMIN D 25 HYDROXY (VIT D DEFICIENCY, FRACTURES): VITD: 32.01 ng/mL (ref 30.00–100.00)

## 2014-02-27 LAB — HEMOGLOBIN A1C: HEMOGLOBIN A1C: 7 % — AB (ref 4.6–6.5)

## 2014-02-27 LAB — TSH: TSH: 0.57 u[IU]/mL (ref 0.35–4.50)

## 2014-02-27 MED ORDER — GABAPENTIN 300 MG PO CAPS: 300.0000 mg | ORAL_CAPSULE | Freq: Every day | ORAL | Status: DC

## 2014-02-27 MED ORDER — METFORMIN HCL 1000 MG PO TABS: ORAL_TABLET | ORAL | Status: DC

## 2014-02-27 MED ORDER — GLIPIZIDE 5 MG PO TABS: ORAL_TABLET | ORAL | Status: DC

## 2014-02-27 NOTE — Progress Notes (Signed)
Pre-visit discussion using our clinic review tool. No additional management support is needed unless otherwise documented below in the visit note.  

## 2014-02-27 NOTE — Patient Instructions (Signed)
This is Dr. Lupita Dawn version of a  "Low GI"  Weight loss Diet.  It is appropriate for all patients with normal renal function , gluten tolerance, and advised for patients who have prediabetes or diabetes:   All of the foods can be found at grocery stores and in bulk at [employer].  The Atkins protein bars and shakes are available in more varieties at Target, WalMart and [place].     7 AM Breakfast:  Choose from the following:  < 5 carbs  Weekdays: Low carbohydrate Protein  Shakes (EAS AdvantEdge "Carb Control" shakes, Atkins,  Muscle Milk or Premier Protein shakes)     Weekends:  a scrambled egg/bacon/cheese burrito made with Mission's "carb  balance" whole wheat tortilla  (about 10 net carbs )  Eggs,  bacon /sausage , Joseph's pita /lavash bread or  (5 carbs)  A slice of fritatta ( egg based baked dish, no  crust:  google it) (< 10 carbs)   Avoid cereal and bananas, oatmeal and cream of wheat and grits. They are loaded with carbohydrates!   10 AM: high protein snack  (< 5 carbs)   Protein bar by Atkins  Or KIND  (the snack size, < 200 cal, usually < 6 carbs    A stick of cheese:  Around 1 carb,  100 cal      Other so called "protein bars" tend to be loaded with carbohydrates.  Remember, in food advertising, the word "energy" is synonymous for " carbohydrate."  Lunch:   A Sandwich using the bread choices listed, Can use any  Eggs,  lunchmeat, grilled meat or canned tuna).  Can add avocado, regular mayo/mustard  and cheese.  A Salad using blue cheese, ranch,  Goddess dressing  or vinagrette,  No croutons or "confetti" and no "candied nuts" but regular nuts OK.   2 HARD BOILED EGG WHITES AND A CUP OF one of these greek yogurts:    dannon lt n fit greek yogurt         chobani 100 greek yogurt,    Oikos triple zero greek yogurt       No pretzels or chips.  Pickles and miniature sweet peppers are a good low  carb alternative that provide a "crunch"  The bread is the only source of  carbohydrate in a sandwich and  can be decreased by trying some of these alternatives to traditional loaf bread:   Joseph's pita bread and Lavash (flat) bread :  50 cal and 4 net carbs  available at BJs and WalMart.  Taste better when toasted, use as pita chips  [employer] makes a variety of  flatbreads and  A PITA POCKET.    LOOK FOR  THE ONES THAT ARE 17 NET CARBS OR LESS    Mission makes 2 sizes of  Low carb whole wheat tortillas  (The large one is  210 cal and 6 net carbs)   Avoid "Low fat dressings, as well as Barry Brunner and [place] dressings    3 PM/ Mid day  Snack:  Consider  1 ounce of  almonds, walnuts, pistachios, pecans, peanuts,  Macadamia nuts or a nut medley that does not contain raisins or cranberries.  No "granola"; the dried cranberries and raisins are loaded with carbohydrates. Mixed nuts as long as there are no raisins,  cranberries or dried fruit.    Try the prosciutto/mozzarella cheese sticks by [employer]  In deli /backery section   High protein  To avoid overindulging in snacks: Try drinking a glass of unsweeted almond/coconut milk  Or a cup of coffee with your Atkins chocolate bar to keep you from having 3!!!   Pork rinds!  Yes Pork Rinds are low carb potato chip substitute!   Toasted Joseph's flatbread with hummous dip (chickpeas)       6 PM  Dinner:     Meat/fowl/fish with a green salad, and either broccoli, cauliflower, green beans, spinach, brussel sprouts, bok choy or  Lima beans. Fried in canola oil /olive oil BUT DO NOT BREAD THE PROTEIN!!      There is a low carb pasta by Dreamfield's that is acceptable and tastes great: only 5 digestible carbs/serving.( All grocery stores but BJs carry it )  Prepared Meals:  Try Hurley Cisco Angelo's chicken piccata or chicken or eggplant parm over low carb pasta.(Lowes and BJs)   Marjory Lies Sanchez's "Carnitas" (pulled pork, no sauce,  0 carbs) or his beef pot roast to make a dinner burrito (at [employer])  Barbecue with cole slaw is low  carb BUT NO BUN!  SAME WITH HAMBURGERS     Whole wheat pasta is still full of digestible carbs and  Not as low in glycemic index as Dreamfield's.   Brown rice is still rice,  So skip the rice and noodles if you eat [place] or [place] (or at least limit to 1/2 cup)  9 PM snack :   Breyer's "low carb" fudgsicle or  ice cream bar (Carb Smart line), or  Weight  Watcher's ice cream bar , or another "no sugar added" ice cream;  a serving of fresh berries/cherries with whipped cream   Cheese or greek yogurt   8 ounces of [place] unsweetened almond/cococunut milk  Cheese and crackers (using WASA crackers,  They are low carb) or peanut butter on low carb crackers or pita bread     Avoid bananas, pineapple, grapes  and watermelon on a regular basis because they are high in sugar.  THINK OF THEM AS DESSERT and do not have daily   Remember that snack Substitutions should be less than 10 NET carbs per serving and meals should be < 20 net carbs. Remember that carbohydrates from fiber do not affect blood sugar, so you can  subtract fiber grams to  get the "net carbs " of any particular food item.   Health Maintenance A healthy lifestyle and preventative care can promote health and wellness.  Maintain regular health, dental, and eye exams.  Eat a healthy diet. Foods like vegetables, fruits, whole grains, low-fat dairy products, and lean protein foods contain the nutrients you need and are low in calories. Decrease your intake of foods high in solid fats, added sugars, and salt. Get information about a proper diet from your health care provider, if necessary.  Regular physical exercise is one of the most important things you can do for your health. Most adults should get at least 150 minutes of moderate-intensity exercise (any activity that increases your heart rate and causes you to sweat) each week. In addition, most adults need muscle-strengthening exercises on 2 or more days a week.   Maintain a  healthy weight. The body mass index (BMI) is a screening tool to identify possible weight problems. It provides an estimate of body fat based on height and weight. Your health care provider can find your BMI and can help you achieve or maintain a healthy weight. For males 20 years and older:  A BMI below  18.5 is considered underweight.  A BMI of 18.5 to 24.9 is normal.  A BMI of 25 to 29.9 is considered overweight.  A BMI of 30 and above is considered obese.  Maintain normal blood lipids and cholesterol by exercising and minimizing your intake of saturated fat. Eat a balanced diet with plenty of fruits and vegetables. Blood tests for lipids and cholesterol should begin at age 45 and be repeated every 5 years. If your lipid or cholesterol levels are high, you are over age 29, or you are at high risk for heart disease, you may need your cholesterol levels checked more frequently.Ongoing high lipid and cholesterol levels should be treated with medicines if diet and exercise are not working.  If you smoke, find out from your health care provider how to quit. If you do not use tobacco, do not start.  Lung cancer screening is recommended for adults aged 26-80 years who are at high risk for developing lung cancer because of a history of smoking. A yearly low-dose CT scan of the lungs is recommended for people who have at least a 30-pack-year history of smoking and are current smokers or have quit within the past 15 years. A pack year of smoking is smoking an average of 1 pack of cigarettes a day for 1 year (for example, a 30-pack-year history of smoking could mean smoking 1 pack a day for 30 years or 2 packs a day for 15 years). Yearly screening should continue until the smoker has stopped smoking for at least 15 years. Yearly screening should be stopped for people who develop a health problem that would prevent them from having lung cancer treatment.  If you choose to drink alcohol, do not have more than  2 drinks per day. One drink is considered to be 12 oz (360 mL) of beer, 5 oz (150 mL) of wine, or 1.5 oz (45 mL) of liquor.  Avoid the use of street drugs. Do not share needles with anyone. Ask for help if you need support or instructions about stopping the use of drugs.  High blood pressure causes heart disease and increases the risk of stroke. Blood pressure should be checked at least every 1-2 years. Ongoing high blood pressure should be treated with medicines if weight loss and exercise are not effective.  If you are 14-30 years old, ask your health care provider if you should take aspirin to prevent heart disease.  Diabetes screening involves taking a blood sample to check your fasting blood sugar level. This should be done once every 3 years after age 76 if you are at a normal weight and without risk factors for diabetes. Testing should be considered at a younger age or be carried out more frequently if you are overweight and have at least 1 risk factor for diabetes.  Colorectal cancer can be detected and often prevented. Most routine colorectal cancer screening begins at the age of 64 and continues through age 6. However, your health care provider may recommend screening at an earlier age if you have risk factors for colon cancer. On a yearly basis, your health care provider may provide home test kits to check for hidden blood in the stool. A small camera at the end of a tube may be used to directly examine the colon (sigmoidoscopy or colonoscopy) to detect the earliest forms of colorectal cancer. Talk to your health care provider about this at age 36 when routine screening begins. A direct exam of the colon should be  repeated every 5-10 years through age 20, unless early forms of precancerous polyps or small growths are found.  People who are at an increased risk for hepatitis B should be screened for this virus. You are considered at high risk for hepatitis B if:  You were born in a country  where hepatitis B occurs often. Talk with your health care provider about which countries are considered high risk.  Your parents were born in a high-risk country and you have not received a shot to protect against hepatitis B (hepatitis B vaccine).  You have HIV or AIDS.  You use needles to inject street drugs.  You live with, or have sex with, someone who has hepatitis B.  You are a man who has sex with other men (MSM).  You get hemodialysis treatment.  You take certain medicines for conditions like cancer, organ transplantation, and autoimmune conditions.  Hepatitis C blood testing is recommended for all people born from [birth year] through 1963 and any individual with known risk factors for hepatitis C.  Healthy men should no longer receive prostate-specific antigen (PSA) blood tests as part of routine cancer screening. Talk to your health care provider about prostate cancer screening.  Testicular cancer screening is not recommended for adolescents or adult males who have no symptoms. Screening includes self-exam, a health care provider exam, and other screening tests. Consult with your health care provider about any symptoms you have or any concerns you have about testicular cancer.  Practice safe sex. Use condoms and avoid high-risk sexual practices to reduce the spread of sexually transmitted infections (STIs).  You should be screened for STIs, including gonorrhea and chlamydia if:  You are sexually active and are younger than 24 years.  You are older than 24 years, and your health care provider tells you that you are at risk for this type of infection.  Your sexual activity has changed since you were last screened, and you are at an increased risk for chlamydia or gonorrhea. Ask your health care provider if you are at risk.  If you are at risk of being infected with HIV, it is recommended that you take a prescription medicine daily to prevent HIV infection. This is called  pre-exposure prophylaxis (PrEP). You are considered at risk if:  You are a man who has sex with other men (MSM).  You are a heterosexual man who is sexually active with multiple partners.  You take drugs by injection.  You are sexually active with a partner who has HIV.  Talk with your health care provider about whether you are at high risk of being infected with HIV. If you choose to begin PrEP, you should first be tested for HIV. You should then be tested every 3 months for as long as you are taking PrEP.  Use sunscreen. Apply sunscreen liberally and repeatedly throughout the day. You should seek shade when your shadow is shorter than you. Protect yourself by wearing long sleeves, pants, a wide-brimmed hat, and sunglasses year round whenever you are outdoors.  Tell your health care provider of new moles or changes in moles, especially if there is a change in shape or color. Also, tell your health care provider if a mole is larger than the size of a pencil eraser.  A one-time screening for abdominal aortic aneurysm (AAA) and surgical repair of large AAAs by ultrasound is recommended for men aged 11-75 years who are current or former smokers.  Stay current with your vaccines (immunizations). Document Released:  07/03/2007 Document Revised: 01/09/2013 Document Reviewed: 06/01/2010 ExitCare Patient Information 2015 [place], [place]. This information is not intended to replace advice given to you by your health care provider. Make sure you discuss any questions you have with your health care provider.

## 2014-02-27 NOTE — Progress Notes (Signed)
Patient ID: [name] [name], male   DOB: May 04, 1940, 74 y.o.   MRN: [id number]  The patient is here for annual Medicare wellness examination and management of other chronic and acute problems.  HAS AN ADVANCED DIRECTIVE AT HOME,  SON Carin Primrose Lear  has power of attorney for all decision  Full CODE.      The risk factors are reflected in the social history.  The roster of all physicians providing medical care to patient - is listed in the Snapshot section of the chart.  Activities of daily living:  The patient is 100% independent in all ADLs: dressing, toileting, feeding as well as independent mobility  Home safety : The patient has smoke detectors in the home. They wear seatbelts.  There are no firearms at home. There is no violence in the home.   There is no risks for hepatitis, STDs or HIV. There is no   history of blood transfusion. They have no travel history to infectious disease endemic areas of the world.  The patient has seen their dentist in the last six month. They have seen their eye doctor in the last year. They admit to slight hearing difficulty with regard to whispered voices and some television programs.  They have deferred audiologic testing in the last year.  They do not  have excessive sun exposure. Discussed the need for sun protection: hats, long sleeves and use of sunscreen if there is significant sun exposure.   Diet: the importance of a healthy diet is discussed. They do have a healthy diet.  The benefits of regular aerobic exercise were discussed. She walks 4 times per week ,  20 minutes.   Depression screen: there are no signs or vegative symptoms of depression- irritability, change in appetite, anhedonia, sadness/tearfullness.  Cognitive assessment: the patient manages all their financial and personal affairs and is actively engaged. They could relate day,date,year and events; recalled 2/3 objects at 3 minutes; performed clock-face test normally.  The following  portions of the patient's history were reviewed and updated as appropriate: allergies, current medications, past family history, past medical history,  past surgical history, past social history  and problem list.  Visual acuity was not assessed per patient preference since she has regular follow up with her ophthalmologist. Hearing and body mass index were assessed and reviewed.   During the course of the visit the patient was educated and counseled about appropriate screening and preventive services including : fall prevention , diabetes screening, nutrition counseling, colorectal cancer screening, and recommended immunizations.    Review of systems:  Patient denies headache, fevers, malaise, unintentional weight loss, skin rash, eye pain, sinus congestion and sinus pain, sore throat, dysphagia,  hemoptysis , cough, dyspnea, wheezing, chest pain, palpitations, orthopnea, edema, abdominal pain, nausea, melena, diarrhea, constipation, flank pain, dysuria, hematuria, urinary  Frequency, nocturia, , tingling, seizures,  Focal weakness, Loss of consciousness,  Tremor, insomnia, depression, anxiety, and suicidal ideation.     Objective:  BP 118/70 mmHg  Pulse 93  Temp(Src) 97.4 F (36.3 C) (Oral)  Resp 14  Ht 5' 8.25" (1.734 m)  Wt 232 lb 12 oz (105.575 kg)  BMI 35.11 kg/m2  SpO2 94%  General Appearance:    Alert, cooperative, no distress, appears stated age  Head:    Normocephalic, without obvious abnormality, atraumatic  Eyes:    PERRL, conjunctiva/corneas clear, EOM's intact, fundi    benign, both eyes       Ears:    Normal TM's and  external ear canals, both ears  Nose:   Nares normal, septum midline, mucosa normal, no drainage   or sinus tenderness  Throat:   Lips, mucosa, and tongue normal; teeth and gums normal  Neck:   Supple, symmetrical, trachea midline, no adenopathy;       thyroid:  No enlargement/tenderness/nodules; no carotid   bruit or JVD  Back:     Symmetric, no  curvature, ROM normal, no CVA tenderness  Lungs:     Clear to auscultation bilaterally, respirations unlabored  Chest wall:    No tenderness or deformity  Heart:    Regular rate and rhythm, S1 and S2 normal, no murmur, rub   or gallop  Abdomen:     Soft, non-tender, bowel sounds active all four quadrants,    no masses, no organomegaly  Genitalia:    Normal male without lesion, discharge or tenderness  Rectal:    Normal tone, normal prostate, no masses or tenderness;   guaiac negative stool  Extremities:   Extremities normal, atraumatic, no cyanosis or edema  Pulses:   2+ and symmetric all extremities  Skin:   Skin color, texture, turgor normal, no rashes or lesions  Lymph nodes:   Cervical, supraclavicular, and axillary nodes normal  Neurologic:   CNII-XII intact. Normal strength, sensation and reflexes      throughout   Assessment and Plan:  Problem List Items Addressed This Visit    Type 2 DM with diabetic neuropathy affecting both sides of body    Improving control with low GI diet and exercise .  hemoglobin A1c is now 7.0. Patient is up to date on eye exam and foot exam was done today.  There is  no proteinuria on prior micro urinalysis .   Lab Results  Component Value Date   HGBA1C 7.0* 02/27/2014   Lab Results  Component Value Date   MICROALBUR 1.1 08/23/2013            Relevant Medications   glipiZIDE (GLUCOTROL) tablet   metFORMIN (GLUCOPHAGE) tablet   Other Relevant Orders   Hemoglobin A1c (Completed)   Lipid panel (Completed)   Onychomycosis of toenail   Obesity (BMI 30-39.9)    I have addressed  BMI and recommended a low glycemic index diet utilizing smaller more frequent meals to increase metabolism.  I have also recommended that patient start exercising with a goal of 30 minutes of aerobic exercise a minimum of 5 days per week.       Relevant Medications   glipiZIDE (GLUCOTROL) tablet   metFORMIN (GLUCOPHAGE) tablet   Medicare annual wellness visit,  subsequent    Annual Medicare wellness  exam was done as well as a comprehensive physical exam and management of acute and chronic conditions .  During the course of the visit the patient was educated and counseled about appropriate screening and preventive services including : fall prevention , diabetes screening, nutrition counseling, colorectal cancer screening, and recommended immunizations.  Printed recommendations for health maintenance screenings was given.       Hypertension - Primary   Relevant Orders   Comprehensive metabolic panel (Completed)   Hyperlipidemia    LDL and triglycerides are at goal on current medications. He has no side effects and liver enzymes are normal. No changes today Lab Results  Component Value Date   CHOL 148 02/27/2014   HDL 45.70 02/27/2014   LDLCALC 74 02/27/2014   LDLDIRECT 87.5 12/29/2012   TRIG 143.0 02/27/2014   CHOLHDL 3 02/27/2014  Lab Results  Component Value Date   ALT 22 02/27/2014   AST 20 02/27/2014   ALKPHOS 55 02/27/2014   BILITOT 0.4 02/27/2014          Other Visit Diagnoses    Obesity        Relevant Medications    glipiZIDE (GLUCOTROL) tablet    metFORMIN (GLUCOPHAGE) tablet    Other Relevant Orders    TSH (Completed)    Need for hepatitis C screening test        Relevant Orders    Hepatitis C antibody (Completed)    Vitamin D deficiency        Relevant Orders    Vit D  25 hydroxy (rtn osteoporosis monitoring) (Completed)    Prostate cancer screening        Relevant Orders    PSA, Medicare (Completed)

## 2014-02-28 LAB — HEPATITIS C ANTIBODY: HCV AB: NEGATIVE

## 2014-03-01 DIAGNOSIS — Z0000 Encounter for general adult medical examination without abnormal findings: Secondary | ICD-10-CM | POA: Insufficient documentation

## 2014-03-01 NOTE — Assessment & Plan Note (Signed)

## 2014-03-01 NOTE — Assessment & Plan Note (Signed)
Improving control with low GI diet and exercise .  hemoglobin A1c is now 7.0. Patient is up to date on eye exam and foot exam was done today.  There is  no proteinuria on prior micro urinalysis .   Lab Results  Component Value Date   HGBA1C 7.0* 02/27/2014   Lab Results  Component Value Date   MICROALBUR 1.1 08/23/2013

## 2014-03-01 NOTE — Assessment & Plan Note (Signed)
I have addressed  BMI and recommended a low glycemic index diet utilizing smaller more frequent meals to increase metabolism.  I have also recommended that patient start exercising with a goal of 30 minutes of aerobic exercise a minimum of 5 days per week.  

## 2014-03-01 NOTE — Assessment & Plan Note (Signed)
LDL and triglycerides are at goal on current medications. He has no side effects and liver enzymes are normal. No changes today Lab Results  Component Value Date   CHOL 148 02/27/2014   HDL 45.70 02/27/2014   LDLCALC 74 02/27/2014   LDLDIRECT 87.5 12/29/2012   TRIG 143.0 02/27/2014   CHOLHDL 3 02/27/2014   Lab Results  Component Value Date   ALT 22 02/27/2014   AST 20 02/27/2014   ALKPHOS 55 02/27/2014   BILITOT 0.4 02/27/2014

## 2014-03-02 ENCOUNTER — Encounter: Payer: Self-pay | Admitting: Internal Medicine

## 2014-03-08 ENCOUNTER — Other Ambulatory Visit: Payer: Self-pay | Admitting: Internal Medicine

## 2014-04-18 ENCOUNTER — Other Ambulatory Visit: Payer: Self-pay | Admitting: Internal Medicine

## 2014-05-14 ENCOUNTER — Telehealth: Payer: Self-pay | Admitting: Internal Medicine

## 2014-05-14 NOTE — Telephone Encounter (Signed)
Pt received a automated system call stating to contact the office to make an appointment for an eye exam. Pt stated that he had an exam on December 04, 2013 with Dr. Heron Sabins office.

## 2014-05-15 NOTE — Telephone Encounter (Signed)
Noted eye exam extracted

## 2014-06-28 ENCOUNTER — Ambulatory Visit: Payer: Commercial Managed Care - PPO | Admitting: Internal Medicine

## 2014-07-10 ENCOUNTER — Ambulatory Visit (INDEPENDENT_AMBULATORY_CARE_PROVIDER_SITE_OTHER): Payer: 59 | Admitting: Internal Medicine

## 2014-07-10 ENCOUNTER — Encounter: Payer: Self-pay | Admitting: Internal Medicine

## 2014-07-10 VITALS — BP 106/60 | HR 78 | Temp 97.6°F | Resp 14 | Ht 68.0 in | Wt 215.5 lb

## 2014-07-10 DIAGNOSIS — E669 Obesity, unspecified: Secondary | ICD-10-CM

## 2014-07-10 DIAGNOSIS — I10 Essential (primary) hypertension: Secondary | ICD-10-CM | POA: Diagnosis not present

## 2014-07-10 DIAGNOSIS — E1142 Type 2 diabetes mellitus with diabetic polyneuropathy: Secondary | ICD-10-CM | POA: Diagnosis not present

## 2014-07-10 DIAGNOSIS — E785 Hyperlipidemia, unspecified: Secondary | ICD-10-CM | POA: Diagnosis not present

## 2014-07-10 LAB — COMPREHENSIVE METABOLIC PANEL
ALT: 28 U/L (ref 0–53)
AST: 28 U/L (ref 0–37)
Albumin: 4.1 g/dL (ref 3.5–5.2)
Alkaline Phosphatase: 43 U/L (ref 39–117)
BUN: 25 mg/dL — ABNORMAL HIGH (ref 6–23)
CO2: 24 meq/L (ref 19–32)
CREATININE: 1.03 mg/dL (ref 0.40–1.50)
Calcium: 9.7 mg/dL (ref 8.4–10.5)
Chloride: 106 mEq/L (ref 96–112)
GFR: 75.41 mL/min (ref >60.00)
GLUCOSE: 116 mg/dL — AB (ref 70–99)
Potassium: 4.3 mEq/L (ref 3.5–5.1)
Sodium: 137 mEq/L (ref 135–145)
TOTAL PROTEIN: 7 g/dL (ref 6.0–8.3)
Total Bilirubin: 0.3 mg/dL (ref 0.2–1.2)

## 2014-07-10 LAB — MICROALBUMIN / CREATININE URINE RATIO
Creatinine,U: 146.1 mg/dL
MICROALB UR: 0.9 mg/dL (ref 0.0–1.9)
Microalb Creat Ratio: 0.6 mg/g (ref 0.0–30.0)

## 2014-07-10 LAB — LIPID PANEL
CHOL/HDL RATIO: 4
Cholesterol: 142 mg/dL (ref 0–200)
HDL: 39.9 mg/dL (ref >39.00)
LDL CALC: 65 mg/dL (ref 0–99)
NonHDL: 102.1
Triglycerides: 188 mg/dL — ABNORMAL HIGH (ref 0.0–149.0)
VLDL: 37.6 mg/dL (ref 0.0–40.0)

## 2014-07-10 LAB — LDL CHOLESTEROL, DIRECT: Direct LDL: 72 mg/dL

## 2014-07-10 LAB — HEMOGLOBIN A1C: Hgb A1c MFr Bld: 6.2 % (ref 4.6–6.5)

## 2014-07-10 NOTE — Patient Instructions (Signed)
You have done remarkably well!   I will see you in 3 months

## 2014-07-10 NOTE — Progress Notes (Signed)
Pre-visit discussion using our clinic review tool. No additional management support is needed unless otherwise documented below in the visit note.  

## 2014-07-14 ENCOUNTER — Encounter: Payer: Self-pay | Admitting: Internal Medicine

## 2014-07-14 NOTE — Progress Notes (Signed)
Subjective:  Patient ID: [name] [name], male    DOB: 1940/03/26  Age: 74 y.o. MRN: [id number]  CC: The primary encounter diagnosis was Type 2 DM with diabetic neuropathy affecting both sides of body. Diagnoses of Hyperlipidemia, Obesity (BMI 30-39.9), and Essential hypertension were also pertinent to this visit.  HPI [name] [name] presents for follow up on Type 2 DM with neuropathy, hypertension, hyperlipidemia and obesity.  He feels generally well, is exercising several times per week and checking blood sugars once daily at variable times.  BS have been under 130 fasting and < 150 post prandially.  Denies any recent hypoglyemic events.  Taking his medications as directed. Following a carbohydrate modified diet 6 days per week. Denies numbness, burning and tingling of extremities. Appetite is good.       Outpatient Prescriptions Prior to Visit  Medication Sig Dispense Refill  . amLODipine-benazepril (LOTREL) 10-40 MG per capsule TAKE ONE CAPSULE DAILY 90 capsule 1  . aspirin 81 MG tablet Take 81 mg by mouth daily.      . Choline Fenofibrate (FENOFIBRIC ACID) 135 MG CPDR TAKE ONE CAPSULE DAILY 90 capsule 1  . co-enzyme Q-10 30 MG capsule Take 30 mg by mouth daily.      . CRESTOR 40 MG tablet TAKE 1 TABLET EVERY DAY USUALLY IN THE EVENING 30 tablet 6  . fish oil-omega-3 fatty acids 1000 MG capsule Take 2 g by mouth daily.      Marland Kitchen gabapentin (NEURONTIN) 300 MG capsule Take 1 capsule (300 mg total) by mouth at bedtime. 90 capsule 2  . glipiZIDE (GLUCOTROL) 5 MG tablet TAKE 1/2 TABLET TWICE A DAY WITH MEALS 90 tablet 1  . metFORMIN (GLUCOPHAGE) 1000 MG tablet TAKE ONE TABLET TWICE A DAY WITH MEALS. 180 tablet 1  . Multiple Vitamin (MULTIVITAMIN) tablet Take 1 tablet by mouth daily.       No facility-administered medications prior to visit.    Review of Systems;  Patient denies headache, fevers, malaise, unintentional weight loss, skin rash, eye pain, sinus congestion and sinus pain, sore  throat, dysphagia,  hemoptysis , cough, dyspnea, wheezing, chest pain, palpitations, orthopnea, edema, abdominal pain, nausea, melena, diarrhea, constipation, flank pain, dysuria, hematuria, urinary  Frequency, nocturia, numbness, tingling, seizures,  Focal weakness, Loss of consciousness,  Tremor, insomnia, depression, anxiety, and suicidal ideation.      Objective:  BP 106/60 mmHg  Pulse 78  Temp(Src) 97.6 F (36.4 C) (Oral)  Resp 14  Ht 5\' 8"  (1.727 m)  Wt 215 lb 8 oz (97.75 kg)  BMI 32.77 kg/m2  SpO2 95%  BP Readings from Last 3 Encounters:  07/10/14 106/60  02/27/14 118/70  08/23/13 108/60    Wt Readings from Last 3 Encounters:  07/10/14 215 lb 8 oz (97.75 kg)  02/27/14 232 lb 12 oz (105.575 kg)  08/23/13 218 lb 8 oz (99.111 kg)    General appearance: alert, cooperative and appears stated age Ears: normal TM's and external ear canals both ears Throat: lips, mucosa, and tongue normal; teeth and gums normal Neck: no adenopathy, no carotid bruit, supple, symmetrical, trachea midline and thyroid not enlarged, symmetric, no tenderness/mass/nodules Back: symmetric, no curvature. ROM normal. No CVA tenderness. Lungs: clear to auscultation bilaterally Heart: regular rate and rhythm, S1, S2 normal, no murmur, click, rub or gallop Abdomen: soft, non-tender; bowel sounds normal; no masses,  no organomegaly Pulses: 2+ and symmetric Skin: Skin color, texture, turgor normal. No rashes or lesions Lymph nodes: Cervical, supraclavicular, and  axillary nodes normal.  Lab Results  Component Value Date   HGBA1C 6.2 07/10/2014   HGBA1C 7.0* 02/27/2014   HGBA1C 7.1* 08/23/2013    Lab Results  Component Value Date   CREATININE 1.03 07/10/2014   CREATININE 0.84 02/27/2014   CREATININE 1.0 08/23/2013    Lab Results  Component Value Date   GLUCOSE 116* 07/10/2014   CHOL 142 07/10/2014   TRIG 188.0* 07/10/2014   HDL 39.90 07/10/2014   LDLDIRECT 72.0 07/10/2014   LDLCALC 65  07/10/2014   ALT 28 07/10/2014   AST 28 07/10/2014   NA 137 07/10/2014   K 4.3 07/10/2014   CL 106 07/10/2014   CREATININE 1.03 07/10/2014   BUN 25* 07/10/2014   CO2 24 07/10/2014   TSH 0.57 02/27/2014   PSA 1.23 02/27/2014   HGBA1C 6.2 07/10/2014   MICROALBUR 0.9 07/10/2014    No results found.  Assessment & Plan:   Problem List Items Addressed This Visit    Type 2 DM with diabetic neuropathy affecting both sides of body - Primary    Improving control with low GI diet and exercise .  hemoglobin A1c is now < 7.0. Patient is up to date on eye exam and foot exam was done today.  There is  no proteinuria on prior micro urinalysis .   Lab Results  Component Value Date   HGBA1C 6.2 07/10/2014   Lab Results  Component Value Date   MICROALBUR 0.9 07/10/2014              Relevant Orders   Comprehensive metabolic panel (Completed)   Hemoglobin A1c (Completed)   LDL cholesterol, direct (Completed)   Lipid panel (Completed)   Microalbumin / creatinine urine ratio (Completed)   Hyperlipidemia    LDL and triglycerides are at goal on current medications. He has no side effects and liver enzymes are normal. No changes today   Lab Results  Component Value Date   CHOL 142 07/10/2014   HDL 39.90 07/10/2014   LDLCALC 65 07/10/2014   LDLDIRECT 72.0 07/10/2014   TRIG 188.0* 07/10/2014   CHOLHDL 4 07/10/2014   Lab Results  Component Value Date   ALT 28 07/10/2014   AST 28 07/10/2014   ALKPHOS 43 07/10/2014   BILITOT 0.3 07/10/2014           Hypertension    Well controlled on current regimen. Renal function stable, no changes today.  Lab Results  Component Value Date   CREATININE 1.03 07/10/2014   Lab Results  Component Value Date   NA 137 07/10/2014   K 4.3 07/10/2014   CL 106 07/10/2014   CO2 24 07/10/2014     ``````      Obesity (BMI 30-39.9)    I have addressed  BMI and recommended wt loss of 10% of body weigh over the next 6 months using a low  glycemic index diet and regular exercise a minimum of 5 days per week.           I am having [name] [name] maintain his aspirin, co-enzyme Q-10, fish oil-omega-3 fatty acids, multivitamin, amLODipine-benazepril, gabapentin, glipiZIDE, metFORMIN, Fenofibric Acid, and CRESTOR.  No orders of the defined types were placed in this encounter.    There are no discontinued medications.  Follow-up: No Follow-up on file.   Crecencio Mc, MD

## 2014-07-14 NOTE — Assessment & Plan Note (Signed)
I have addressed  BMI and recommended wt loss of 10% of body weigh over the next 6 months using a low glycemic index diet and regular exercise a minimum of 5 days per week.   

## 2014-07-14 NOTE — Assessment & Plan Note (Signed)
Well controlled on current regimen. Renal function stable, no changes today.  Lab Results  Component Value Date   CREATININE 1.03 07/10/2014   Lab Results  Component Value Date   NA 137 07/10/2014   K 4.3 07/10/2014   CL 106 07/10/2014   CO2 24 07/10/2014     ``````

## 2014-07-14 NOTE — Assessment & Plan Note (Signed)
LDL and triglycerides are at goal on current medications. He has no side effects and liver enzymes are normal. No changes today   Lab Results  Component Value Date   CHOL 142 07/10/2014   HDL 39.90 07/10/2014   LDLCALC 65 07/10/2014   LDLDIRECT 72.0 07/10/2014   TRIG 188.0* 07/10/2014   CHOLHDL 4 07/10/2014   Lab Results  Component Value Date   ALT 28 07/10/2014   AST 28 07/10/2014   ALKPHOS 43 07/10/2014   BILITOT 0.3 07/10/2014

## 2014-07-14 NOTE — Assessment & Plan Note (Signed)
Improving control with low GI diet and exercise .  hemoglobin A1c is now < 7.0. Patient is up to date on eye exam and foot exam was done today.  There is  no proteinuria on prior micro urinalysis .   Lab Results  Component Value Date   HGBA1C 6.2 07/10/2014   Lab Results  Component Value Date   MICROALBUR 0.9 07/10/2014

## 2014-08-07 ENCOUNTER — Other Ambulatory Visit: Payer: Self-pay | Admitting: Internal Medicine

## 2014-08-09 NOTE — Telephone Encounter (Signed)
Sent unread mychart message to patient via mail 

## 2014-09-05 ENCOUNTER — Other Ambulatory Visit: Payer: Self-pay | Admitting: Internal Medicine

## 2014-10-16 ENCOUNTER — Other Ambulatory Visit: Payer: Self-pay | Admitting: Internal Medicine

## 2014-11-11 ENCOUNTER — Ambulatory Visit (INDEPENDENT_AMBULATORY_CARE_PROVIDER_SITE_OTHER): Payer: 59 | Admitting: Internal Medicine

## 2014-11-11 ENCOUNTER — Encounter: Payer: Self-pay | Admitting: Internal Medicine

## 2014-11-11 VITALS — BP 126/70 | HR 76 | Temp 97.7°F | Resp 12 | Ht 68.0 in | Wt 209.5 lb

## 2014-11-11 DIAGNOSIS — I10 Essential (primary) hypertension: Secondary | ICD-10-CM | POA: Diagnosis not present

## 2014-11-11 DIAGNOSIS — E785 Hyperlipidemia, unspecified: Secondary | ICD-10-CM

## 2014-11-11 DIAGNOSIS — E1142 Type 2 diabetes mellitus with diabetic polyneuropathy: Secondary | ICD-10-CM

## 2014-11-11 DIAGNOSIS — E663 Overweight: Secondary | ICD-10-CM

## 2014-11-11 DIAGNOSIS — Z23 Encounter for immunization: Secondary | ICD-10-CM

## 2014-11-11 LAB — COMPREHENSIVE METABOLIC PANEL
ALT: 21 U/L (ref 0–53)
AST: 22 U/L (ref 0–37)
Albumin: 4.1 g/dL (ref 3.5–5.2)
Alkaline Phosphatase: 50 U/L (ref 39–117)
BILIRUBIN TOTAL: 0.4 mg/dL (ref 0.2–1.2)
BUN: 18 mg/dL (ref 6–23)
CHLORIDE: 106 meq/L (ref 96–112)
CO2: 26 mEq/L (ref 19–32)
CREATININE: 1.06 mg/dL (ref 0.40–1.50)
Calcium: 9.9 mg/dL (ref 8.4–10.5)
GFR: 72.88 mL/min (ref >60.00)
GLUCOSE: 133 mg/dL — AB (ref 70–99)
Potassium: 4.4 mEq/L (ref 3.5–5.1)
SODIUM: 140 meq/L (ref 135–145)
Total Protein: 6.9 g/dL (ref 6.0–8.3)

## 2014-11-11 LAB — LIPID PANEL
CHOL/HDL RATIO: 3
CHOLESTEROL: 129 mg/dL (ref 0–200)
HDL: 41.3 mg/dL (ref >39.00)
LDL Cholesterol: 60 mg/dL (ref 0–99)
NonHDL: 88.06
Triglycerides: 141 mg/dL (ref 0.0–149.0)
VLDL: 28.2 mg/dL (ref 0.0–40.0)

## 2014-11-11 LAB — LDL CHOLESTEROL, DIRECT: LDL DIRECT: 70 mg/dL

## 2014-11-11 LAB — HEMOGLOBIN A1C: Hgb A1c MFr Bld: 6.3 % (ref 4.6–6.5)

## 2014-11-11 NOTE — Progress Notes (Signed)
Pre-visit discussion using our clinic review tool. No additional management support is needed unless otherwise documented below in the visit note.  

## 2014-11-12 ENCOUNTER — Encounter: Payer: Self-pay | Admitting: Internal Medicine

## 2014-11-12 NOTE — Assessment & Plan Note (Signed)
I have congratulated him in reduction of   BMI and encouraged  Continued weight loss with goal of 10% of body weight over the next 6 months .  He has attained his pre college weight and has no interest in losing any more.  Encouraged to continue using a low glycemic index diet and regular exercise a minimum of 5 days per week.

## 2014-11-12 NOTE — Assessment & Plan Note (Signed)
Well controlled on current regimen. Renal function stable, no changes today.  Lab Results  Component Value Date   CREATININE 1.06 11/11/2014   Lab Results  Component Value Date   NA 140 11/11/2014   K 4.4 11/11/2014   CL 106 11/11/2014   CO2 26 11/11/2014     ``````

## 2014-11-12 NOTE — Assessment & Plan Note (Signed)
Improving control with low GI diet and exercise .  hemoglobin A1c is 6.30. Patient is up to date on eye exam and foot exam was done today.  There is  no proteinuria on prior micro urinalysis .   Lab Results  Component Value Date   HGBA1C 6.3 11/11/2014   Lab Results  Component Value Date   MICROALBUR 0.9 07/10/2014

## 2014-11-12 NOTE — Progress Notes (Signed)
Subjective:  Patient ID: [name] [name], male    DOB: [birth year]  Age: 74 y.o. MRN: [id number]  CC: The primary encounter diagnosis was Type 2 DM with diabetic neuropathy affecting both sides of body ([place]). Diagnoses of Overweight, Hyperlipidemia, and Essential hypertension were also pertinent to this visit.  HPI [name] [name] presents for  4 month follow up on diabetes.  Patient has no complaints today.  Patient is following a low glycemic index diet and taking all prescribed medications regularly without side effects.  Fasting sugars have been under less than 140 most of the time and post prandials have been under 160 except on rare occasions. Patient is exercising about 3 times per week and intentionally losing weight .  Patient has had an eye exam in the last 12 months and checks feet regularly for signs of infection since he has neuropathy.  Patient does not walk barefoot outside.  Patient is up to date on all recommended vaccinations  Outpatient Prescriptions Prior to Visit  Medication Sig Dispense Refill  . amLODipine-benazepril (LOTREL) 10-40 MG per capsule TAKE ONE CAPSULE DAILY 90 capsule 1  . aspirin 81 MG tablet Take 81 mg by mouth daily.      . Choline Fenofibrate (FENOFIBRIC ACID) 135 MG CPDR TAKE ONE CAPSULE DAILY 90 capsule 2  . co-enzyme Q-10 30 MG capsule Take 30 mg by mouth daily.      . fish oil-omega-3 fatty acids 1000 MG capsule Take 2 g by mouth daily.      Marland Kitchen gabapentin (NEURONTIN) 300 MG capsule Take 1 capsule (300 mg total) by mouth at bedtime. 90 capsule 2  . glipiZIDE (GLUCOTROL) 5 MG tablet TAKE ONE-HALF TABLET TWICE A DAY WITH MEALS 90 tablet 1  . metFORMIN (GLUCOPHAGE) 1000 MG tablet TAKE ONE TABLET TWICE A DAY WITH MEALS. 180 tablet 1  . Multiple Vitamin (MULTIVITAMIN) tablet Take 1 tablet by mouth daily.      . CRESTOR 40 MG tablet TAKE 1 TABLET EVERY DAY USUALLY IN THE EVENING 30 tablet 6   No facility-administered medications prior to visit.    Review of  Systems;  Patient denies headache, fevers, malaise, unintentional weight loss, skin rash, eye pain, sinus congestion and sinus pain, sore throat, dysphagia,  hemoptysis , cough, dyspnea, wheezing, chest pain, palpitations, orthopnea, edema, abdominal pain, nausea, melena, diarrhea, constipation, flank pain, dysuria, hematuria, urinary  Frequency, nocturia, numbness, tingling, seizures,  Focal weakness, Loss of consciousness,  Tremor, insomnia, depression, anxiety, and suicidal ideation.      Objective:  BP 126/70 mmHg  Pulse 76  Temp(Src) 97.7 F (36.5 C) (Oral)  Resp 12  Ht 5\' 8"  (1.727 m)  Wt 209 lb 8 oz (95.029 kg)  BMI 31.86 kg/m2  SpO2 95%  BP Readings from Last 3 Encounters:  11/11/14 126/70  07/10/14 106/60  02/27/14 118/70    Wt Readings from Last 3 Encounters:  11/11/14 209 lb 8 oz (95.029 kg)  07/10/14 215 lb 8 oz (97.75 kg)  02/27/14 232 lb 12 oz (105.575 kg)    General appearance: alert, cooperative and appears stated age Ears: normal TM's and external ear canals both ears Throat: lips, mucosa, and tongue normal; teeth and gums normal Neck: no adenopathy, no carotid bruit, supple, symmetrical, trachea midline and thyroid not enlarged, symmetric, no tenderness/mass/nodules Back: symmetric, no curvature. ROM normal. No CVA tenderness. Lungs: clear to auscultation bilaterally Heart: regular rate and rhythm, S1, S2 normal, no murmur, click, rub or gallop Abdomen: soft,  non-tender; bowel sounds normal; no masses,  no organomegaly Pulses: 2+ and symmetric Skin: Skin color, texture, turgor normal. No rashes or lesions Lymph nodes: Cervical, supraclavicular, and axillary nodes normal.  Lab Results  Component Value Date   HGBA1C 6.3 11/11/2014   HGBA1C 6.2 07/10/2014   HGBA1C 7.0* 02/27/2014    Lab Results  Component Value Date   CREATININE 1.06 11/11/2014   CREATININE 1.03 07/10/2014   CREATININE 0.84 02/27/2014    Lab Results  Component Value Date    GLUCOSE 133* 11/11/2014   CHOL 129 11/11/2014   TRIG 141.0 11/11/2014   HDL 41.30 11/11/2014   LDLDIRECT 70.0 11/11/2014   LDLCALC 60 11/11/2014   ALT 21 11/11/2014   AST 22 11/11/2014   NA 140 11/11/2014   K 4.4 11/11/2014   CL 106 11/11/2014   CREATININE 1.06 11/11/2014   BUN 18 11/11/2014   CO2 26 11/11/2014   TSH 0.57 02/27/2014   PSA 1.23 02/27/2014   HGBA1C 6.3 11/11/2014   MICROALBUR 0.9 07/10/2014    No results found.  Assessment & Plan:   Problem List Items Addressed This Visit    Type 2 DM with diabetic neuropathy affecting both sides of body ([name]) - Primary    Improving control with low GI diet and exercise .  hemoglobin A1c is 6.30. Patient is up to date on eye exam and foot exam was done today.  There is  no proteinuria on prior micro urinalysis .   Lab Results  Component Value Date   HGBA1C 6.3 11/11/2014   Lab Results  Component Value Date   MICROALBUR 0.9 07/10/2014                Relevant Orders   Comprehensive metabolic panel (Completed)   Hemoglobin A1c (Completed)   Lipid panel (Completed)   LDL cholesterol, direct (Completed)   Hyperlipidemia    LDL and triglycerides are at goal on current medications. He has no side effects and liver enzymes are normal. No changes today   Lab Results  Component Value Date   CHOL 129 11/11/2014   HDL 41.30 11/11/2014   LDLCALC 60 11/11/2014   LDLDIRECT 70.0 11/11/2014   TRIG 141.0 11/11/2014   CHOLHDL 3 11/11/2014   Lab Results  Component Value Date   ALT 21 11/11/2014   AST 22 11/11/2014   ALKPHOS 50 11/11/2014   BILITOT 0.4 11/11/2014             Hypertension    Well controlled on current regimen. Renal function stable, no changes today.  Lab Results  Component Value Date   CREATININE 1.06 11/11/2014   Lab Results  Component Value Date   NA 140 11/11/2014   K 4.4 11/11/2014   CL 106 11/11/2014   CO2 26 11/11/2014     ``````        Overweight    I have  congratulated him in reduction of   BMI and encouraged  Continued weight loss with goal of 10% of body weight over the next 6 months .  He has attained his pre college weight and has no interest in losing any more.  Encouraged to continue using a low glycemic index diet and regular exercise a minimum of 5 days per week.             I am having Mr. Mcadory maintain his aspirin, co-enzyme Q-10, fish oil-omega-3 fatty acids, multivitamin, gabapentin, metFORMIN, CRESTOR, amLODipine-benazepril, Fenofibric Acid, and glipiZIDE.  No orders  of the defined types were placed in this encounter.    There are no discontinued medications.  Follow-up: Return in about 4 months (around 03/14/2015).   Crecencio Mc, MD

## 2014-11-12 NOTE — Assessment & Plan Note (Signed)
LDL and triglycerides are at goal on current medications. He has no side effects and liver enzymes are normal. No changes today   Lab Results  Component Value Date   CHOL 129 11/11/2014   HDL 41.30 11/11/2014   LDLCALC 60 11/11/2014   LDLDIRECT 70.0 11/11/2014   TRIG 141.0 11/11/2014   CHOLHDL 3 11/11/2014   Lab Results  Component Value Date   ALT 21 11/11/2014   AST 22 11/11/2014   ALKPHOS 50 11/11/2014   BILITOT 0.4 11/11/2014

## 2014-11-13 ENCOUNTER — Other Ambulatory Visit: Payer: Self-pay | Admitting: Internal Medicine

## 2014-12-05 LAB — HM DIABETES EYE EXAM

## 2014-12-10 ENCOUNTER — Encounter: Payer: Self-pay | Admitting: Internal Medicine

## 2015-02-12 ENCOUNTER — Other Ambulatory Visit: Payer: Self-pay | Admitting: Internal Medicine

## 2015-03-14 ENCOUNTER — Encounter: Payer: Self-pay | Admitting: Internal Medicine

## 2015-03-14 ENCOUNTER — Ambulatory Visit (INDEPENDENT_AMBULATORY_CARE_PROVIDER_SITE_OTHER): Payer: 59 | Admitting: Internal Medicine

## 2015-03-14 VITALS — BP 108/64 | HR 76 | Temp 97.7°F | Resp 12 | Ht 68.0 in | Wt 211.5 lb

## 2015-03-14 DIAGNOSIS — E1142 Type 2 diabetes mellitus with diabetic polyneuropathy: Secondary | ICD-10-CM | POA: Diagnosis not present

## 2015-03-14 DIAGNOSIS — I10 Essential (primary) hypertension: Secondary | ICD-10-CM

## 2015-03-14 DIAGNOSIS — G4733 Obstructive sleep apnea (adult) (pediatric): Secondary | ICD-10-CM

## 2015-03-14 DIAGNOSIS — E785 Hyperlipidemia, unspecified: Secondary | ICD-10-CM | POA: Diagnosis not present

## 2015-03-14 DIAGNOSIS — G629 Polyneuropathy, unspecified: Secondary | ICD-10-CM

## 2015-03-14 DIAGNOSIS — Z125 Encounter for screening for malignant neoplasm of prostate: Secondary | ICD-10-CM

## 2015-03-14 DIAGNOSIS — Z0000 Encounter for general adult medical examination without abnormal findings: Secondary | ICD-10-CM | POA: Diagnosis not present

## 2015-03-14 DIAGNOSIS — Z23 Encounter for immunization: Secondary | ICD-10-CM

## 2015-03-14 LAB — COMPREHENSIVE METABOLIC PANEL
ALT: 22 U/L (ref 0–53)
AST: 22 U/L (ref 0–37)
Albumin: 4.3 g/dL (ref 3.5–5.2)
Alkaline Phosphatase: 48 U/L (ref 39–117)
BUN: 15 mg/dL (ref 6–23)
CALCIUM: 10.3 mg/dL (ref 8.4–10.5)
CHLORIDE: 106 meq/L (ref 96–112)
CO2: 25 meq/L (ref 19–32)
CREATININE: 0.93 mg/dL (ref 0.40–1.50)
GFR: 84.68 mL/min (ref >60.00)
GLUCOSE: 121 mg/dL — AB (ref 70–99)
Potassium: 4.4 mEq/L (ref 3.5–5.1)
SODIUM: 138 meq/L (ref 135–145)
Total Bilirubin: 0.4 mg/dL (ref 0.2–1.2)
Total Protein: 6.7 g/dL (ref 6.0–8.3)

## 2015-03-14 LAB — LDL CHOLESTEROL, DIRECT: LDL DIRECT: 77 mg/dL

## 2015-03-14 LAB — PSA, MEDICARE: PSA: 2.39 ng/ml (ref 0.10–4.00)

## 2015-03-14 LAB — LIPID PANEL
CHOLESTEROL: 150 mg/dL (ref 0–200)
HDL: 43.6 mg/dL (ref >39.00)
LDL CALC: 73 mg/dL (ref 0–99)
NonHDL: 106.17
Total CHOL/HDL Ratio: 3
Triglycerides: 166 mg/dL — ABNORMAL HIGH (ref 0.0–149.0)
VLDL: 33.2 mg/dL (ref 0.0–40.0)

## 2015-03-14 LAB — MICROALBUMIN / CREATININE URINE RATIO
Creatinine,U: 144.2 mg/dL
Microalb Creat Ratio: 0.5 mg/g (ref 0.0–30.0)
Microalb, Ur: 0.7 mg/dL (ref 0.0–1.9)

## 2015-03-14 LAB — HEMOGLOBIN A1C: Hgb A1c MFr Bld: 6.4 % (ref 4.6–6.5)

## 2015-03-14 NOTE — Progress Notes (Signed)
Pre-visit discussion using our clinic review tool. No additional management support is needed unless otherwise documented below in the visit note.  

## 2015-03-14 NOTE — Patient Instructions (Addendum)
We will contact you about need for colonoscopy this year vs 2019 with Dr Bary Castilla  PSA will be done today  We are stopping your glipizide.  We will repeat your A1c in 3 months   You will also receive a call about your wellness exam by our health coach ,[place] [facility], Male A healthy lifestyle and preventative care can promote health and wellness.  Maintain regular health, dental, and eye exams.  Eat a healthy diet. Foods like vegetables, fruits, whole grains, low-fat dairy products, and lean protein foods contain the nutrients you need and are low in calories. Decrease your intake of foods high in solid fats, added sugars, and salt. Get information about a proper diet from your health care provider, if necessary.  Regular physical exercise is one of the most important things you can do for your health. Most adults should get at least 150 minutes of moderate-intensity exercise (any activity that increases your heart rate and causes you to sweat) each week. In addition, most adults need muscle-strengthening exercises on 2 or more days a week.   Maintain a healthy weight. The body mass index (BMI) is a screening tool to identify possible weight problems. It provides an estimate of body fat based on height and weight. Your health care provider can find your BMI and can help you achieve or maintain a healthy weight. For males 20 years and older:  A BMI below 18.5 is considered underweight.  A BMI of 18.5 to 24.9 is normal.  A BMI of 25 to 29.9 is considered overweight.  A BMI of 30 and above is considered obese.  Maintain normal blood lipids and cholesterol by exercising and minimizing your intake of saturated fat. Eat a balanced diet with plenty of fruits and vegetables. Blood tests for lipids and cholesterol should begin at age 70 and be repeated every 5 years. If your lipid or cholesterol levels are high, you are over age 83, or you are at high risk for heart disease, you may  need your cholesterol levels checked more frequently.Ongoing high lipid and cholesterol levels should be treated with medicines if diet and exercise are not working.  If you smoke, find out from your health care provider how to quit. If you do not use tobacco, do not start.  Lung cancer screening is recommended for adults aged 36-80 years who are at high risk for developing lung cancer because of a history of smoking. A yearly low-dose CT scan of the lungs is recommended for people who have at least a 30-pack-year history of smoking and are current smokers or have quit within the past 15 years. A pack year of smoking is smoking an average of 1 pack of cigarettes a day for 1 year (for example, a 30-pack-year history of smoking could mean smoking 1 pack a day for 30 years or 2 packs a day for 15 years). Yearly screening should continue until the smoker has stopped smoking for at least 15 years. Yearly screening should be stopped for people who develop a health problem that would prevent them from having lung cancer treatment.  If you choose to drink alcohol, do not have more than 2 drinks per day. One drink is considered to be 12 oz (360 mL) of beer, 5 oz (150 mL) of wine, or 1.5 oz (45 mL) of liquor.  Avoid the use of street drugs. Do not share needles with anyone. Ask for help if you need support or instructions about stopping the  use of drugs.  High blood pressure causes heart disease and increases the risk of stroke. High blood pressure is more likely to develop in:  People who have blood pressure in the end of the normal range (100-139/85-89 mm Hg).  People who are overweight or obese.  People who are African American.  If you are 26-46 years of age, have your blood pressure checked every 3-5 years. If you are 54 years of age or older, have your blood pressure checked every year. You should have your blood pressure measured twice--once when you are at a hospital or clinic, and once when you are  not at a hospital or clinic. Record the average of the two measurements. To check your blood pressure when you are not at a hospital or clinic, you can use:  An automated blood pressure machine at a pharmacy.  A home blood pressure monitor.  If you are 63-93 years old, ask your health care provider if you should take aspirin to prevent heart disease.  Diabetes screening involves taking a blood sample to check your fasting blood sugar level. This should be done once every 3 years after age 3 if you are at a normal weight and without risk factors for diabetes. Testing should be considered at a younger age or be carried out more frequently if you are overweight and have at least 1 risk factor for diabetes.  Colorectal cancer can be detected and often prevented. Most routine colorectal cancer screening begins at the age of 77 and continues through age 34. However, your health care provider may recommend screening at an earlier age if you have risk factors for colon cancer. On a yearly basis, your health care provider may provide home test kits to check for hidden blood in the stool. A small camera at the end of a tube may be used to directly examine the colon (sigmoidoscopy or colonoscopy) to detect the earliest forms of colorectal cancer. Talk to your health care provider about this at age 5 when routine screening begins. A direct exam of the colon should be repeated every 5-10 years through age 21, unless early forms of precancerous polyps or small growths are found.  People who are at an increased risk for hepatitis B should be screened for this virus. You are considered at high risk for hepatitis B if:  You were born in a country where hepatitis B occurs often. Talk with your health care provider about which countries are considered high risk.  Your parents were born in a high-risk country and you have not received a shot to protect against hepatitis B (hepatitis B vaccine).  You have HIV or  AIDS.  You use needles to inject street drugs.  You live with, or have sex with, someone who has hepatitis B.  You are a man who has sex with other men (MSM).  You get hemodialysis treatment.  You take certain medicines for conditions like cancer, organ transplantation, and autoimmune conditions.  Hepatitis C blood testing is recommended for all people born from [birth year] through 1963 and any individual with known risk factors for hepatitis C.  Healthy men should no longer receive prostate-specific antigen (PSA) blood tests as part of routine cancer screening. Talk to your health care provider about prostate cancer screening.  Testicular cancer screening is not recommended for adolescents or adult males who have no symptoms. Screening includes self-exam, a health care provider exam, and other screening tests. Consult with your health care provider about any symptoms  you have or any concerns you have about testicular cancer.  Practice safe sex. Use condoms and avoid high-risk sexual practices to reduce the spread of sexually transmitted infections (STIs).  You should be screened for STIs, including gonorrhea and chlamydia if:  You are sexually active and are younger than 24 years.  You are older than 24 years, and your health care provider tells you that you are at risk for this type of infection.  Your sexual activity has changed since you were last screened, and you are at an increased risk for chlamydia or gonorrhea. Ask your health care provider if you are at risk.  If you are at risk of being infected with HIV, it is recommended that you take a prescription medicine daily to prevent HIV infection. This is called pre-exposure prophylaxis (PrEP). You are considered at risk if:  You are a man who has sex with other men (MSM).  You are a heterosexual man who is sexually active with multiple partners.  You take drugs by injection.  You are sexually active with a partner who has  HIV.  Talk with your health care provider about whether you are at high risk of being infected with HIV. If you choose to begin PrEP, you should first be tested for HIV. You should then be tested every 3 months for as long as you are taking PrEP.  Use sunscreen. Apply sunscreen liberally and repeatedly throughout the day. You should seek shade when your shadow is shorter than you. Protect yourself by wearing long sleeves, pants, a wide-brimmed hat, and sunglasses year round whenever you are outdoors.  Tell your health care provider of new moles or changes in moles, especially if there is a change in shape or color. Also, tell your health care provider if a mole is larger than the size of a pencil eraser.  A one-time screening for abdominal aortic aneurysm (AAA) and surgical repair of large AAAs by ultrasound is recommended for men aged 63-75 years who are current or former smokers.  Stay current with your vaccines (immunizations).   This information is not intended to replace advice given to you by your health care provider. Make sure you discuss any questions you have with your health care provider.   Document Released: 07/03/2007 Document Revised: 01/25/2014 Document Reviewed: 06/01/2010 Elsevier Interactive Patient Education [employer].

## 2015-03-14 NOTE — Progress Notes (Signed)
Patient ID: [name] [name], male    DOB: November 05, 1940  Age: 75 y.o. MRN: [id number]  The patient is here for annual  wellness examination and management of other chronic and acute problems, including Type 2 DM , hypertension, right LE neuropathy and OA>    The risk factors are reflected in the social history.  Next colonosocpy due 2019 [place] Lab Results  Component Value Date   PSA 2.39 03/14/2015   PSA 1.23 02/27/2014   Lab Results  Component Value Date   HGBA1C 6.4 03/14/2015     The roster of all physicians providing medical care to patient - is listed in the Snapshot section of the chart.  Activities of daily living:  The patient is 100% independent in all ADLs: dressing, toileting, feeding as well as independent mobility  Home safety : The patient has smoke detectors in the home. They wear seatbelts.  There are no firearms at home. There is no violence in the home.   There is no risks for hepatitis, STDs or HIV. There is no   history of blood transfusion. They have no travel history to infectious disease endemic areas of the world.  The patient has seen their dentist in the last six month. They have seen their eye doctor in the last year. They admit to slight hearing difficulty with regard to whispered voices and some television programs.  They have deferred audiologic testing in the last year.  They do not  have excessive sun exposure. Discussed the need for sun protection: hats, long sleeves and use of sunscreen if there is significant sun exposure.   Diet: the importance of a healthy diet is discussed. They do have a healthy diet.  The benefits of regular aerobic exercise were discussed. She walks 4 times per week ,  20 minutes.   Depression screen: there are no signs or vegative symptoms of depression- irritability, change in appetite, anhedonia, sadness/tearfullness.  Cognitive assessment: the patient manages all their financial and personal affairs and is actively engaged.  They could relate day,date,year and events; recalled 2/3 objects at 3 minutes; performed clock-face test normally.  The following portions of the patient's history were reviewed and updated as appropriate: allergies, current medications, past family history, past medical history,  past surgical history, past social history  and problem list.  Visual acuity was not assessed per patient preference since she has regular follow up with her ophthalmologist. Hearing and body mass index were assessed and reviewed.   During the course of the visit the patient was educated and counseled about appropriate screening and preventive services including : fall prevention , diabetes screening, nutrition counseling, colorectal cancer screening, and recommended immunizations.    CC: The primary encounter diagnosis was Encounter for preventive health examination. 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  History [name] [name] has a past medical history of Positive PPD, treated (1970); Peripheral neuropathy ([place]) (01/2007); Diabetes mellitus; Hyperlipidemia; and Hypertension.   He has past surgical history that includes Tonsillectomy; Rotator cuff repair (2005); and Colonoscopy (2004).   His family history includes Dementia in his sister; Kidney disease in his sister.He reports that he quit smoking about 34 years ago. His smoking use included Cigarettes. He quit smokeless tobacco use about 24 years ago. His smokeless tobacco use included Chew. He reports  that he drinks alcohol. He reports that he does not use illicit drugs.  Outpatient Prescriptions Prior to Visit  Medication Sig Dispense Refill  . amLODipine-benazepril (LOTREL) 10-40 MG capsule TAKE  ONE CAPSULE DAILY 90 capsule 1  . aspirin 81 MG tablet Take 81 mg by mouth daily.      . Choline Fenofibrate (FENOFIBRIC ACID) 135 MG CPDR TAKE ONE CAPSULE DAILY 90 capsule 2  . co-enzyme Q-10 30 MG capsule Take 30 mg by mouth daily.      . fish oil-omega-3 fatty acids 1000 MG capsule Take 2 g by mouth daily.      Marland Kitchen gabapentin (NEURONTIN) 300 MG capsule Take 1 capsule (300 mg total) by mouth at bedtime. 90 capsule 2  . glipiZIDE (GLUCOTROL) 5 MG tablet TAKE ONE-HALF TABLET TWICE A DAY WITH MEALS 90 tablet 1  . metFORMIN (GLUCOPHAGE) 1000 MG tablet TAKE ONE TABLET TWICE A DAY WITH MEALS. 180 tablet 3  . Multiple Vitamin (MULTIVITAMIN) tablet Take 1 tablet by mouth daily.      . rosuvastatin (CRESTOR) 40 MG tablet TAKE 1 TABLET EVERY DAY USUALLY IN THE EVENING 30 tablet 3   No facility-administered medications prior to visit.    Review of Systems   Patient denies headache, fevers, malaise, unintentional weight loss, skin rash, eye pain, sinus congestion and sinus pain, sore throat, dysphagia,  hemoptysis , cough, dyspnea, wheezing, chest pain, palpitations, orthopnea, edema, abdominal pain, nausea, melena, diarrhea, constipation, flank pain, dysuria, hematuria, urinary  Frequency, nocturia, numbness, tingling, seizures,  Focal weakness, Loss of consciousness,  Tremor, insomnia, depression, anxiety, and suicidal ideation.      Objective:  BP 108/64 mmHg  Pulse 76  Temp(Src) 97.7 F (36.5 C) (Oral)  Resp 12  Ht 5\' 8"  (1.727 m)  Wt 211 lb 8 oz (95.936 kg)  BMI 32.17 kg/m2  SpO2 97%  Physical Exam   General appearance: alert, cooperative and appears stated age Ears: normal TM's and external ear canals both ears Throat: lips, mucosa, and tongue normal; teeth and gums normal Neck: no adenopathy, no carotid bruit, supple, symmetrical, trachea midline and thyroid not enlarged, symmetric, no tenderness/mass/nodules Back: symmetric, no curvature. ROM normal. No CVA tenderness. Lungs: clear  to auscultation bilaterally Heart: regular rate and rhythm, S1, S2 normal, no murmur, click, rub or gallop Abdomen: soft, non-tender; bowel sounds normal; no masses,  no organomegaly Pulses: 2+ and symmetric Skin: Skin color, texture, turgor normal. No rashes or lesions Lymph nodes: Cervical, supraclavicular, and axillary nodes normal. Neuro: CNs 2-12 intact. DTRs 2+/4 in biceps, brachioradialis, patellars and achilles. Muscle strength 5/5 in upper and lower exremities. Fine resting tremor bilaterally both hands cerebellar function normal. Romberg negative.  No pronator drift.   Gait normal.      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  hemoglobin A1c is 6.4.  Will stop the glipizide for 3 months, per patient request,  And repeat A1c i 3 months . Patient is up to date on eye exam and foot exam was done today.  There is  no proteinuria on prior micro urinalysis .   Lab Results  Component Value Date   HGBA1C 6.4 03/14/2015   Lab Results  Component Value Date   MICROALBUR 0.7 03/14/2015  Relevant Orders   Comprehensive metabolic panel (Completed)   Hemoglobin A1c (Completed)   Lipid panel (Completed)   Microalbumin / creatinine urine ratio (Completed)   Hyperlipidemia    LDL and triglycerides are at goal on current medications. He has no side effects and liver enzymes are normal. No changes today   Lab Results  Component Value Date   CHOL 150 03/14/2015   HDL 43.60 03/14/2015   LDLCALC 73 03/14/2015   LDLDIRECT 77.0 03/14/2015   TRIG 166.0* 03/14/2015   CHOLHDL 3 03/14/2015   Lab Results  Component Value Date   ALT 22 03/14/2015   AST 22 03/14/2015   ALKPHOS 48 03/14/2015   BILITOT 0.4 03/14/2015         Relevant Orders   LDL cholesterol, direct (Completed)   Hypertension    Well controlled on  current regimen. Renal function stable, no changes today.  Lab Results  Component Value Date   CREATININE 0.93 03/14/2015   Lab Results  Component Value Date   NA 138 03/14/2015   K 4.4 03/14/2015   CL 106 03/14/2015   CO2 25 03/14/2015         OSA (obstructive sleep apnea)    Diagnosed by prior sleep study. Patient is using CPAP every night a minimum of 6 hours per night and notes improved daytime wakefulness and decreased fatigue       Encounter for preventive health examination - Primary    Annual comprehensive preventive exam was done as well as an evaluation and management of chronic conditions .  During the course of the visit the patient was educated and counseled about appropriate screening and preventive services including : , nutrition counseling, colorectal cancer screening, and recommended immunizations.  Printed recommendations for health maintenance screenings was given.         Other Visit Diagnoses    Neuropathy ([place])        Relevant Orders    RPR (Completed)    Prostate cancer screening        Relevant Orders    PSA, Medicare (Completed)    Need for prophylactic vaccination against Streptococcus pneumoniae (pneumococcus)        Relevant Orders    Pneumococcal polysaccharide vaccine 23-valent greater than or equal to 2yo subcutaneous/IM (Completed)       I am having [name] [name] maintain his aspirin, co-enzyme Q-10, fish oil-omega-3 fatty acids, multivitamin, gabapentin, Fenofibric Acid, glipiZIDE, metFORMIN, rosuvastatin, and amLODipine-benazepril.  No orders of the defined types were placed in this encounter.    There are no discontinued medications.  Follow-up: Return in about 3 months (around 06/11/2015) for follow up diabetes,  get A1c prior to visit .   Crecencio Mc, MD

## 2015-03-15 LAB — RPR

## 2015-03-16 ENCOUNTER — Encounter: Payer: Self-pay | Admitting: Internal Medicine

## 2015-03-16 NOTE — Assessment & Plan Note (Signed)
LDL and triglycerides are at goal on current medications. He has no side effects and liver enzymes are normal. No changes today   Lab Results  Component Value Date   CHOL 150 03/14/2015   HDL 43.60 03/14/2015   LDLCALC 73 03/14/2015   LDLDIRECT 77.0 03/14/2015   TRIG 166.0* 03/14/2015   CHOLHDL 3 03/14/2015   Lab Results  Component Value Date   ALT 22 03/14/2015   AST 22 03/14/2015   ALKPHOS 48 03/14/2015   BILITOT 0.4 03/14/2015

## 2015-03-16 NOTE — Assessment & Plan Note (Signed)
Well controlled on current regimen. Renal function stable, no changes today.  Lab Results  Component Value Date   CREATININE 0.93 03/14/2015   Lab Results  Component Value Date   NA 138 03/14/2015   K 4.4 03/14/2015   CL 106 03/14/2015   CO2 25 03/14/2015

## 2015-03-16 NOTE — Assessment & Plan Note (Signed)
Diagnosed by prior sleep study. Patient is using CPAP every night a minimum of 6 hours per night and notes improved daytime wakefulness and decreased fatigue  

## 2015-03-16 NOTE — Assessment & Plan Note (Signed)
Annual comprehensive preventive exam was done as well as an evaluation and management of chronic conditions .  During the course of the visit the patient was educated and counseled about appropriate screening and preventive services including :   , nutrition counseling, colorectal cancer screening, and recommended immunizations.  Printed recommendations for health maintenance screenings was given.  

## 2015-03-16 NOTE — Assessment & Plan Note (Signed)
Improving control with low GI diet and exercise .  hemoglobin A1c is 6.4.  Will stop the glipizide for 3 months, per patient request,  And repeat A1c i 3 months . Patient is up to date on eye exam and foot exam was done today.  There is  no proteinuria on prior micro urinalysis .   Lab Results  Component Value Date   HGBA1C 6.4 03/14/2015   Lab Results  Component Value Date   MICROALBUR 0.7 03/14/2015

## 2015-03-26 ENCOUNTER — Ambulatory Visit (INDEPENDENT_AMBULATORY_CARE_PROVIDER_SITE_OTHER): Payer: Medicare Other | Admitting: General Surgery

## 2015-03-26 ENCOUNTER — Encounter: Payer: Self-pay | Admitting: General Surgery

## 2015-03-26 DIAGNOSIS — Z86010 Personal history of colonic polyps: Secondary | ICD-10-CM | POA: Diagnosis not present

## 2015-03-26 MED ORDER — POLYETHYLENE GLYCOL 3350 17 GM/SCOOP PO POWD: ORAL | Status: DC

## 2015-03-26 NOTE — Progress Notes (Signed)
Patient ID: [name] [name], male   DOB: 10-Jul-1940, 75 y.o.   MRN: [id number]  Chief Complaint  Patient presents with  . Colonoscopy    HPI [name] is a 75 y.o. male here today for a evaluation of a colonoscopy. Last colonoscopy  03/17/12. No GI problems at this time. Bowels move daily and no bleeding.   HPI  Past Medical History  Diagnosis Date  . Positive PPD, treated 1970  . Peripheral neuropathy ([place]) 01/2007    positive EMG studies, negative workup for causes  . Diabetes mellitus     Type 2  . Hyperlipidemia   . Hypertension     Past Surgical History  Procedure Laterality Date  . Tonsillectomy    . Rotator cuff repair  2005    right (Dr. Francia Greaves)  . Colonoscopy  2004, 2014    Dr Bary Castilla    Family History  Problem Relation Age of Onset  . Dementia Sister   . Kidney disease Sister   . Cancer Neg Hx     Social History Social History  Substance Use Topics  . Smoking status: Former Smoker    Types: Cigarettes    Quit date: 10/18/1980  . Smokeless tobacco: Former Systems developer    Types: [place] date: 10/19/1990  . Alcohol Use: Yes     Comment: occasioanl    No Known Allergies  Current Outpatient Prescriptions  Medication Sig Dispense Refill  . amLODipine-benazepril (LOTREL) 10-40 MG capsule TAKE ONE CAPSULE DAILY 90 capsule 1  . aspirin 81 MG tablet Take 81 mg by mouth daily.      . Choline Fenofibrate (FENOFIBRIC ACID) 135 MG CPDR TAKE ONE CAPSULE DAILY 90 capsule 2  . co-enzyme Q-10 30 MG capsule Take 30 mg by mouth daily.      . fish oil-omega-3 fatty acids 1000 MG capsule Take 2 g by mouth daily.      Marland Kitchen gabapentin (NEURONTIN) 300 MG capsule Take 1 capsule (300 mg total) by mouth at bedtime. 90 capsule 2  . metFORMIN (GLUCOPHAGE) 1000 MG tablet TAKE ONE TABLET TWICE A DAY WITH MEALS. 180 tablet 3  . Multiple Vitamin (MULTIVITAMIN) tablet Take 1 tablet by mouth daily.      . rosuvastatin (CRESTOR) 40 MG tablet TAKE 1 TABLET EVERY DAY USUALLY IN THE  EVENING 30 tablet 3  . polyethylene glycol powder (GLYCOLAX/MIRALAX) powder 255 grams one bottle for colonoscopy prep 255 g 0   No current facility-administered medications for this visit.    Review of Systems Review of Systems  Constitutional: Negative.   Respiratory: Negative.   Cardiovascular: Negative.   Gastrointestinal: Negative for diarrhea, constipation and blood in stool.    Blood pressure 140/82, pulse 70, resp. rate 16, height 5\' 8"  (1.727 m), weight 213 lb (96.616 kg).  Physical Exam Physical Exam  Constitutional: He is oriented to person, place, and time. He appears well-developed and well-nourished.  Eyes: Conjunctivae are normal. No scleral icterus.  Neck: Neck supple.  Cardiovascular: Normal rate, regular rhythm and normal heart sounds.   Pulmonary/Chest: Effort normal and breath sounds normal.  Lymphadenopathy:    He has no cervical adenopathy.  Neurological: He is alert and oriented to person, place, and time.  Skin: Skin is warm and dry.  Psychiatric: His behavior is normal.    Data Reviewed Colonoscopy in 2014 showed 3 tubular adenomas without high-grade dysplasia. The patient had at least 2 polyps in the cecum and hepatic flexure as well  as a tubular adenoma in the descending colon.  Assessment    Multiple previous colonic polyps.    Plan    The patient could have a follow-up colonoscopy in either 3 (present) or 5 year schedule. It will not change the total number of exams. Elected to complete this now and hopefully if negative a final exam in 5 years.    Colonoscopy with possible biopsy/polypectomy prn: Information regarding the procedure, including its potential risks and complications (including but not limited to perforation of the bowel, which may require emergency surgery to repair, and bleeding) was verbally given to the patient. Educational information regarding lower intestinal endoscopy was given to the patient. Written instructions for how to  complete the bowel prep using Miralax were provided. The importance of drinking ample fluids to avoid dehydration as a result of the prep emphasized.  Patient has been scheduled for a colonoscopy on 04-30-15 at [facility]. This patient has been asked to hold metformin day of colonoscopy prep and procedure. It is okay for patient to continue 81 mg aspirin once daily. Patient has been asked to discontinue fish oil one week prior to procedure.    PCP:  Deborra Medina  This information has been scribed by Karie Fetch Chapin.    Robert Bellow 03/26/2015, 9:25 PM

## 2015-03-26 NOTE — Patient Instructions (Addendum)
Colonoscopy A colonoscopy is an exam to look at the entire large intestine (colon). This exam can help find problems such as tumors, polyps, inflammation, and areas of bleeding. The exam takes about 1 hour.  LET [facility] CARE PROVIDER KNOW ABOUT:   Any allergies you have.  All medicines you are taking, including vitamins, herbs, eye drops, creams, and over-the-counter medicines.  Previous problems you or members of your family have had with the use of anesthetics.  Any blood disorders you have.  Previous surgeries you have had.  Medical conditions you have. RISKS AND COMPLICATIONS  Generally, this is a safe procedure. However, as with any procedure, complications can occur. Possible complications include:  Bleeding.  Tearing or rupture of the colon wall.  Reaction to medicines given during the exam.  Infection (rare). BEFORE THE PROCEDURE   Ask your health care provider about changing or stopping your regular medicines.  You may be prescribed an oral bowel prep. This involves drinking a large amount of medicated liquid, starting the day before your procedure. The liquid will cause you to have multiple loose stools until your stool is almost clear or light green. This cleans out your colon in preparation for the procedure.  Do not eat or drink anything else once you have started the bowel prep, unless your health care provider tells you it is safe to do so.  Arrange for someone to drive you home after the procedure. PROCEDURE   You will be given medicine to help you relax (sedative).  You will lie on your side with your knees bent.  A long, flexible tube with a light and camera on the end (colonoscope) will be inserted through the rectum and into the colon. The camera sends video back to a computer screen as it moves through the colon. The colonoscope also releases carbon dioxide gas to inflate the colon. This helps your health care provider see the area better.  During  the exam, your health care provider may take a small tissue sample (biopsy) to be examined under a microscope if any abnormalities are found.  The exam is finished when the entire colon has been viewed. AFTER THE PROCEDURE   Do not drive for 24 hours after the exam.  You may have a small amount of blood in your stool.  You may pass moderate amounts of gas and have mild abdominal cramping or bloating. This is caused by the gas used to inflate your colon during the exam.  Ask when your test results will be ready and how you will get your results. Make sure you get your test results.   This information is not intended to replace advice given to you by your health care provider. Make sure you discuss any questions you have with your health care provider.   Document Released: 01/02/2000 Document Revised: 10/25/2012 Document Reviewed: 09/11/2012 Elsevier Interactive Patient Education [employer].  Patient has been scheduled for a colonoscopy on 04-30-15 at [facility]. This patient has been asked to hold metformin day of colonoscopy prep and procedure. It is okay for patient to continue 81 mg aspirin once daily. Patient has been asked to discontinue fish oil one week prior to procedure.

## 2015-04-02 ENCOUNTER — Other Ambulatory Visit: Payer: Self-pay | Admitting: Internal Medicine

## 2015-04-24 ENCOUNTER — Telehealth: Payer: Self-pay | Admitting: *Deleted

## 2015-04-24 NOTE — Telephone Encounter (Signed)
Patient confirms no medication changes since last office visit. Also, reports that he has picked up Miralax prescription.   We will proceed with colonoscopy as scheduled for 04-30-15 at [facility].   This patient was instructed to call the office should he have further questions.

## 2015-04-30 ENCOUNTER — Ambulatory Visit: Payer: Medicare Other | Admitting: *Deleted

## 2015-04-30 ENCOUNTER — Encounter: Payer: Self-pay | Admitting: *Deleted

## 2015-04-30 ENCOUNTER — Encounter
Admission: RE | Disposition: A | Discharge status: Home / Self Care | Payer: Self-pay | Source: Ambulatory Visit | Attending: General Surgery

## 2015-04-30 ENCOUNTER — Ambulatory Visit
Admission: RE | Admit: 2015-04-30 | Discharge: 2015-04-30 | Disposition: A | Discharge status: Home / Self Care | Payer: Medicare Other | Source: Ambulatory Visit | Attending: General Surgery | Admitting: General Surgery

## 2015-04-30 DIAGNOSIS — Z7982 Long term (current) use of aspirin: Secondary | ICD-10-CM | POA: Diagnosis not present

## 2015-04-30 DIAGNOSIS — Z86010 Personal history of colonic polyps: Secondary | ICD-10-CM | POA: Insufficient documentation

## 2015-04-30 DIAGNOSIS — I10 Essential (primary) hypertension: Secondary | ICD-10-CM | POA: Insufficient documentation

## 2015-04-30 DIAGNOSIS — Z1211 Encounter for screening for malignant neoplasm of colon: Secondary | ICD-10-CM | POA: Diagnosis not present

## 2015-04-30 DIAGNOSIS — E785 Hyperlipidemia, unspecified: Secondary | ICD-10-CM | POA: Insufficient documentation

## 2015-04-30 DIAGNOSIS — K5730 Diverticulosis of large intestine without perforation or abscess without bleeding: Secondary | ICD-10-CM | POA: Diagnosis not present

## 2015-04-30 DIAGNOSIS — E1321 Other specified diabetes mellitus with diabetic nephropathy: Secondary | ICD-10-CM | POA: Insufficient documentation

## 2015-04-30 DIAGNOSIS — Z87891 Personal history of nicotine dependence: Secondary | ICD-10-CM | POA: Insufficient documentation

## 2015-04-30 DIAGNOSIS — E669 Obesity, unspecified: Secondary | ICD-10-CM | POA: Insufficient documentation

## 2015-04-30 DIAGNOSIS — Z6831 Body mass index (BMI) 31.0-31.9, adult: Secondary | ICD-10-CM | POA: Insufficient documentation

## 2015-04-30 DIAGNOSIS — Z7984 Long term (current) use of oral hypoglycemic drugs: Secondary | ICD-10-CM | POA: Diagnosis not present

## 2015-04-30 HISTORY — PX: COLONOSCOPY WITH PROPOFOL: SHX5780

## 2015-04-30 LAB — GLUCOSE, CAPILLARY: Glucose-Capillary: 161 mg/dL — ABNORMAL HIGH (ref 65–99)

## 2015-04-30 SURGERY — COLONOSCOPY WITH PROPOFOL
Anesthesia: General

## 2015-04-30 MED ORDER — FENTANYL CITRATE (PF) 100 MCG/2ML IJ SOLN
INTRAMUSCULAR | Status: DC | PRN
  Administered 2015-04-30: 50 ug via INTRAVENOUS

## 2015-04-30 MED ORDER — SODIUM CHLORIDE 0.9 % IV SOLN
INTRAVENOUS | Status: DC
  Administered 2015-04-30: 10:00:00 via INTRAVENOUS

## 2015-04-30 MED ORDER — LACTATED RINGERS IV SOLN
INTRAVENOUS | Status: DC | PRN
  Administered 2015-04-30: 10:00:00 via INTRAVENOUS

## 2015-04-30 MED ORDER — PROPOFOL 500 MG/50ML IV EMUL
INTRAVENOUS | Status: DC | PRN
  Administered 2015-04-30: 120 ug/kg/min via INTRAVENOUS

## 2015-04-30 MED ORDER — MIDAZOLAM HCL 2 MG/2ML IJ SOLN
INTRAMUSCULAR | Status: DC | PRN
  Administered 2015-04-30: 1 mg via INTRAVENOUS

## 2015-04-30 NOTE — Op Note (Signed)
[facility] Patient Name: [name] [name] Procedure Date: 04/30/2015 9:52 AM MRN: [id number] Account #: [id number] Date of Birth: October 13, 1940 Admit Type: Outpatient Age: 75 Room: [facility] ENDO ROOM 1 Gender: Male Note Status: Finalized Procedure:            Colonoscopy Indications:          High risk colon cancer surveillance: Personal history                        of colonic polyps Providers:            Robert Bellow, MD Referring MD:         Deborra Medina, MD (Referring MD) Medicines:            Monitored Anesthesia Care Complications:        No immediate complications. Procedure:            Pre-Anesthesia Assessment:                       - Prior to the procedure, a History and Physical was                        performed, and patient medications, allergies and                        sensitivities were reviewed. The patient's tolerance of                        previous anesthesia was reviewed.                       - The risks and benefits of the procedure and the                        sedation options and risks were discussed with the                        patient. All questions were answered and informed                        consent was obtained.                       After obtaining informed consent, the colonoscope was                        passed under direct vision. Throughout the procedure,                        the patient's blood pressure, pulse, and oxygen                        saturations were monitored continuously. The                        Colonoscope was introduced through the anus and                        advanced to the the cecum, identified by appendiceal  orifice and ileocecal valve. The colonoscopy was                        performed without difficulty. The patient tolerated the                        procedure well. The quality of the bowel preparation                        was  excellent. Findings:      Multiple medium-mouthed diverticula were found in the sigmoid colon and       ascending colon.      The retroflexed view of the distal rectum and anal verge was normal and       showed no anal or rectal abnormalities. Impression:           - Mild diverticulosis in the sigmoid colon and in the                        ascending colon.                       - The distal rectum and anal verge are normal on                        retroflexion view.                       - No specimens collected. Recommendation:       - Repeat colonoscopy in 5 years for surveillance. Procedure Code(s):    --- Professional ---                       [phone], Colonoscopy, flexible; diagnostic, including                        collection of specimen(s) by brushing or washing, when                        performed (separate procedure) Diagnosis Code(s):    --- Professional ---                       Z86.010, Personal history of colonic polyps                       K57.30, Diverticulosis of large intestine without                        perforation or abscess without bleeding CPT copyright 2016 American Medical Association. All rights reserved. The codes documented in this report are preliminary and upon coder review may  be revised to meet current compliance requirements. Robert Bellow, MD 04/30/2015 10:21:57 AM This report has been signed electronically. Number of Addenda: 0 Note Initiated On: 04/30/2015 9:52 AM Scope Withdrawal Time: 0 hours 9 minutes 49 seconds  Total Procedure Duration: 0 hours 18 minutes 49 seconds       [facility]

## 2015-04-30 NOTE — Anesthesia Postprocedure Evaluation (Signed)
Anesthesia Post Note  Patient: [name] [name]  Procedure(s) Performed: Procedure(s) (LRB): COLONOSCOPY WITH PROPOFOL (N/A)  Patient location during evaluation: Endoscopy Anesthesia Type: General Level of consciousness: awake Pain management: pain level controlled Vital Signs Assessment: post-procedure vital signs reviewed and stable Respiratory status: spontaneous breathing Cardiovascular status: blood pressure returned to baseline Postop Assessment: no headache Anesthetic complications: no    Last Vitals:  Filed Vitals:   04/30/15 1024 04/30/15 1030  BP: 137/53 98/53  Pulse: 75 58  Temp: 36.8 C   Resp: 18 13    Last Pain: There were no vitals filed for this visit.               Yuliana Vandrunen M

## 2015-04-30 NOTE — Anesthesia Preprocedure Evaluation (Signed)
Anesthesia Evaluation  Patient identified by MRN, date of birth, ID band Patient awake    Reviewed: Allergy & Precautions, NPO status , Patient's Chart, lab work & pertinent test results  Airway Mallampati: II  TM Distance: >3 FB Neck ROM: Limited    Dental  (+) Partial Lower   Pulmonary sleep apnea and Continuous Positive Airway Pressure Ventilation , former smoker,    Pulmonary exam normal        Cardiovascular hypertension, Normal cardiovascular exam     Neuro/Psych Peripheral neuropathy--DM.    GI/Hepatic   Endo/Other  diabetes, Type 2BG 161.  Renal/GU      Musculoskeletal   Abdominal (+) + obese,   Peds  Hematology   Anesthesia Other Findings   Reproductive/Obstetrics                             Anesthesia Physical Anesthesia Plan  ASA: III  Anesthesia Plan: General   Post-op Pain Management:    Induction: Intravenous  Airway Management Planned: Nasal Cannula  Additional Equipment:   Intra-op Plan:   Post-operative Plan:   Informed Consent: I have reviewed the patients History and Physical, chart, labs and discussed the procedure including the risks, benefits and alternatives for the proposed anesthesia with the patient or authorized representative who has indicated his/her understanding and acceptance.     Plan Discussed with: CRNA  Anesthesia Plan Comments:         Anesthesia Quick Evaluation

## 2015-04-30 NOTE — Transfer of Care (Signed)
Immediate Anesthesia Transfer of Care Note  Patient: [name] [name]  Procedure(s) Performed: Procedure(s): COLONOSCOPY WITH PROPOFOL (N/A)  Patient Location: PACU  Anesthesia Type:General  Level of Consciousness: awake and alert   Airway & Oxygen Therapy: Patient Spontanous Breathing  Post-op Assessment: Report given to RN  Post vital signs: Reviewed and stable  Last Vitals:  Filed Vitals:   04/30/15 0923 04/30/15 1024  BP: 111/58 137/53  Pulse: 74 75  Temp: 36.2 C 36.8 C  Resp: 16 18    Complications: No apparent anesthesia complications

## 2015-04-30 NOTE — H&P (Signed)
[name] [name] [id number] 1940/09/20     HPI: No change in clinical history or exam.  Tolerated prep well.   Prescriptions prior to admission  Medication Sig Dispense Refill Last Dose  . amLODipine-benazepril (LOTREL) 10-40 MG capsule TAKE ONE CAPSULE DAILY 90 capsule 1 04/29/2015 at Unknown time  . aspirin 81 MG tablet Take 81 mg by mouth daily.     04/29/2015 at Unknown time  . Choline Fenofibrate (FENOFIBRIC ACID) 135 MG CPDR TAKE ONE CAPSULE DAILY 90 capsule 2 04/29/2015 at Unknown time  . co-enzyme Q-10 30 MG capsule Take 30 mg by mouth daily.     Past Week at Unknown time  . fish oil-omega-3 fatty acids 1000 MG capsule Take 2 g by mouth daily.     Past Week at Unknown time  . gabapentin (NEURONTIN) 300 MG capsule Take 1 capsule (300 mg total) by mouth at bedtime. 90 capsule 2 04/29/2015 at Unknown time  . Multiple Vitamin (MULTIVITAMIN) tablet Take 1 tablet by mouth daily.     Past Week at Unknown time  . polyethylene glycol powder (GLYCOLAX/MIRALAX) powder 255 grams one bottle for colonoscopy prep 255 g 0 04/29/2015 at Unknown time  . rosuvastatin (CRESTOR) 40 MG tablet TAKE 1 TABLET EVERY DAY USUALLY IN THE EVENING 30 tablet 4 04/29/2015 at Unknown time  . metFORMIN (GLUCOPHAGE) 1000 MG tablet TAKE ONE TABLET TWICE A DAY WITH MEALS. 180 tablet 3 04/28/2015   Allergies  Allergen Reactions  . Neosporin [Neomycin-Bacitracin Zn-Polymyx] Rash  . Polysporin [Bacitracin-Polymyxin B] Rash   Past Medical History  Diagnosis Date  . Positive PPD, treated 1970  . Peripheral neuropathy ([place]) 01/2007    positive EMG studies, negative workup for causes  . Diabetes mellitus     Type 2  . Hyperlipidemia   . Hypertension    Past Surgical History  Procedure Laterality Date  . Tonsillectomy    . Rotator cuff repair  2005    right (Dr. Francia Greaves)  . Colonoscopy  2004, 2014    Dr Bary Castilla   Social History   Social History  . Marital Status: Married    Spouse Name: N/A  . Number of Children: N/A   . Years of Education: N/A   Occupational History  . Not on file.   Social History Main Topics  . Smoking status: Former Smoker    Types: Cigarettes    Quit date: 10/18/1980  . Smokeless tobacco: Former Systems developer    Types: [employer] date: 10/19/1990  . Alcohol Use: Yes     Comment: occasioanl  . Drug Use: No  . Sexual Activity: Not on file   Other Topics Concern  . Not on file   Social History Narrative   Social History   Social History Narrative     ROS: Negative.     PE: HEENT: Negative. Lungs: Clear. Cardio: RR. Robert Bellow 04/30/2015  Data Reviewed  Colonoscopy in 2014 showed 3 tubular adenomas without high-grade dysplasia. The patient had at least 2 polyps in the cecum and hepatic flexure as well as a tubular adenoma in the descending colon.   Assessment/Plan:  Proceed with planned endoscopy.

## 2015-04-30 NOTE — Addendum Note (Signed)
Addendum  created 04/30/15 1055 by Lesle Reek, CRNA   Modules edited: Anesthesia Medication Administration

## 2015-05-12 ENCOUNTER — Other Ambulatory Visit: Payer: Self-pay | Admitting: Internal Medicine

## 2015-05-27 ENCOUNTER — Other Ambulatory Visit: Payer: Self-pay

## 2015-05-27 DIAGNOSIS — E1142 Type 2 diabetes mellitus with diabetic polyneuropathy: Secondary | ICD-10-CM

## 2015-05-27 DIAGNOSIS — E785 Hyperlipidemia, unspecified: Secondary | ICD-10-CM

## 2015-06-10 ENCOUNTER — Ambulatory Visit (INDEPENDENT_AMBULATORY_CARE_PROVIDER_SITE_OTHER): Payer: Medicare Other

## 2015-06-10 VITALS — BP 130/70 | HR 76 | Temp 97.1°F | Resp 14 | Ht 68.0 in | Wt 210.8 lb

## 2015-06-10 DIAGNOSIS — E785 Hyperlipidemia, unspecified: Secondary | ICD-10-CM | POA: Diagnosis not present

## 2015-06-10 DIAGNOSIS — E1142 Type 2 diabetes mellitus with diabetic polyneuropathy: Secondary | ICD-10-CM | POA: Diagnosis not present

## 2015-06-10 DIAGNOSIS — Z0000 Encounter for general adult medical examination without abnormal findings: Secondary | ICD-10-CM | POA: Diagnosis not present

## 2015-06-10 LAB — LIPID PANEL
CHOL/HDL RATIO: 4
CHOLESTEROL: 159 mg/dL (ref 0–200)
HDL: 39 mg/dL — ABNORMAL LOW (ref >39.00)
LDL CALC: 83 mg/dL (ref 0–99)
NonHDL: 120.22
Triglycerides: 184 mg/dL — ABNORMAL HIGH (ref 0.0–149.0)
VLDL: 36.8 mg/dL (ref 0.0–40.0)

## 2015-06-10 LAB — COMPREHENSIVE METABOLIC PANEL
ALK PHOS: 48 U/L (ref 39–117)
ALT: 24 U/L (ref 0–53)
AST: 21 U/L (ref 0–37)
Albumin: 4.3 g/dL (ref 3.5–5.2)
BUN: 17 mg/dL (ref 6–23)
CHLORIDE: 105 meq/L (ref 96–112)
CO2: 25 mEq/L (ref 19–32)
Calcium: 9.7 mg/dL (ref 8.4–10.5)
Creatinine, Ser: 1.02 mg/dL (ref 0.40–1.50)
GFR: 76.07 mL/min (ref >60.00)
GLUCOSE: 159 mg/dL — AB (ref 70–99)
POTASSIUM: 4.2 meq/L (ref 3.5–5.1)
Sodium: 137 mEq/L (ref 135–145)
TOTAL PROTEIN: 6.8 g/dL (ref 6.0–8.3)
Total Bilirubin: 0.3 mg/dL (ref 0.2–1.2)

## 2015-06-10 LAB — HEMOGLOBIN A1C: HEMOGLOBIN A1C: 7.4 % — AB (ref 4.6–6.5)

## 2015-06-10 LAB — LDL CHOLESTEROL, DIRECT: Direct LDL: 86 mg/dL

## 2015-06-10 NOTE — Progress Notes (Signed)
Subjective:   [name] [name] is a 75 y.o. male who presents for Medicare Annual/Subsequent preventive examination.  Review of Systems:  No ROS.  Medicare Wellness Visit.  Cardiac Risk Factors include: advanced age (>56men, >57 women);male gender;diabetes mellitus;hypertension     Objective:    Vitals: BP 130/70 mmHg  Pulse 76  Temp(Src) 97.1 F (36.2 C) (Oral)  Resp 14  Ht 5\' 8"  (1.727 m)  Wt 210 lb 12.8 oz (95.618 kg)  BMI 32.06 kg/m2  SpO2 96%  Body mass index is 32.06 kg/(m^2).  Tobacco History  Smoking status  . Former Smoker  . Types: Cigarettes  . Quit date: 10/18/1980  Smokeless tobacco  . Former Systems developer  . Types: Chew  . Quit date: 10/19/1990     Counseling given: Not Answered   Past Medical History  Diagnosis Date  . Positive PPD, treated 1970  . Peripheral neuropathy ([place]) 01/2007    positive EMG studies, negative workup for causes  . Diabetes mellitus     Type 2  . Hyperlipidemia   . Hypertension    Past Surgical History  Procedure Laterality Date  . Tonsillectomy    . Rotator cuff repair  2005    right (Dr. Francia Greaves)  . Colonoscopy  2004, 2014    Dr Bary Castilla  . Colonoscopy with propofol N/A 04/30/2015    Procedure: COLONOSCOPY WITH PROPOFOL;  Surgeon: Robert Bellow, MD;  Location: [facility] ENDOSCOPY;  Service: Endoscopy;  Laterality: N/A;   Family History  Problem Relation Age of Onset  . Dementia Sister   . Kidney disease Sister   . Cancer Neg Hx    History  Sexual Activity  . Sexual Activity: Not Currently    Outpatient Encounter Prescriptions as of 06/10/2015  Medication Sig  . amLODipine-benazepril (LOTREL) 10-40 MG capsule TAKE ONE CAPSULE DAILY  . aspirin 81 MG tablet Take 81 mg by mouth daily.    . Choline Fenofibrate (FENOFIBRIC ACID) 135 MG CPDR TAKE ONE CAPSULE DAILY  . co-enzyme Q-10 30 MG capsule Take 30 mg by mouth daily.    . fish oil-omega-3 fatty acids 1000 MG capsule Take 2 g by mouth daily.    Marland Kitchen gabapentin (NEURONTIN)  300 MG capsule TAKE ONE CAPSULE AT BEDTIME  . metFORMIN (GLUCOPHAGE) 1000 MG tablet TAKE ONE TABLET TWICE A DAY WITH MEALS.  . Multiple Vitamin (MULTIVITAMIN) tablet Take 1 tablet by mouth daily.    . rosuvastatin (CRESTOR) 40 MG tablet TAKE 1 TABLET EVERY DAY USUALLY IN THE EVENING   No facility-administered encounter medications on file as of 06/10/2015.    Activities of Daily Living In your present state of health, do you have any difficulty performing the following activities: 06/10/2015  Hearing? N  Vision? N  Difficulty concentrating or making decisions? N  Walking or climbing stairs? Y  Dressing or bathing? N  Doing errands, shopping? N  Preparing Food and eating ? N  Using the Toilet? N  In the past six months, have you accidently leaked urine? N  Do you have problems with loss of bowel control? N  Managing your Medications? N  Managing your Finances? N  Housekeeping or managing your Housekeeping? N    Patient Care Team: Crecencio Mc, MD as PCP - General (Internal Medicine) Robert Bellow, MD (General Surgery) Crecencio Mc, MD (Internal Medicine)   Assessment:   This is a routine wellness examination for [name] [name]. The goal of the wellness visit is to assist the patient  how to close the gaps in care and create a preventative care plan for the patient.   Osteoporosis risk reviewed.  Medications reviewed; taking without issues or barriers.  Safety issues reviewed; smoke and carbon monoxide detectors in the home. Firearms locked in a secure area in the home. Wears seatbelts when driving or riding with others. No violence in the home.  No identified risk were noted; The patient was oriented x 3; appropriate in dress and manner and no objective failures at ADL's or IADL's.   Health maintenance gap; closed.  Patient Concerns:  Hypopigmented skin lesion on L shoulder;  about 1cm = 1/2 of an inch in size. Onset several months ago. Itchy at times.  Use of alcohol and  peroxide for comfort.  No drainage.  Follow up appointment scheduled.  Exercise Activities and Dietary recommendations Current Exercise Habits: Structured exercise class, Type of exercise: walking;treadmill;calisthenics, Time (Minutes): 60, Frequency (Times/Week): 3, Weekly Exercise (Minutes/Week): 180, Intensity: Mild  Goals    . Healthy Lifestyle     Stay hydrated and drink plenty of fluids.  Increase water intake. Low carb foods.  Lean meats and vegetables. Stay active and continue exercising.      Fall Risk Fall Risk  06/10/2015 11/11/2014 03/01/2014 02/27/2014 08/23/2013  Falls in the past year? No No No No No  Risk for fall due to : - - - - Impaired balance/gait   Depression Screen PHQ 2/9 Scores 06/10/2015 11/11/2014 03/01/2014 02/27/2014  PHQ - 2 Score 0 0 0 0    Cognitive Testing MMSE - Mini Mental State Exam 06/10/2015  Orientation to time 5  Orientation to Place 5  Registration 3  Attention/ Calculation 5  Recall 3  Language- name 2 objects 2  Language- repeat 1  Language- follow 3 step command 3  Language- read & follow direction 1  Write a sentence 1  Copy design 1  Total score 30    Immunization History  Administered Date(s) Administered  . Influenza Split 09/29/2011  . Influenza, High Dose Seasonal PF 11/11/2014  . Influenza,inj,Quad PF,36+ Mos 10/31/2012, 10/13/2013  . Pneumococcal Conjugate-13 04/16/2013  . Pneumococcal Polysaccharide-23 07/15/2010, 03/14/2015  . Tdap 06/29/2010  . Zoster 05/11/2010   Screening Tests Health Maintenance  Topic Date Due  . INFLUENZA VACCINE  08/19/2015  . HEMOGLOBIN A1C  09/11/2015  . OPHTHALMOLOGY EXAM  12/05/2015  . FOOT EXAM  03/13/2016  . TETANUS/TDAP  06/28/2020  . COLONOSCOPY  04/29/2025  . ZOSTAVAX  Completed  . PNA vac Low Risk Adult  Completed      Plan:   End of life planning; Advance aging; Advanced directives discussed. Copy of current HCPOA/Living Will requested.   Return for follow up with  PCP.  During the course of the visit the patient was educated and counseled about the following appropriate screening and preventive services:   Vaccines to include Pneumoccal, Influenza, Hepatitis B, Td, Zostavax, HCV  Electrocardiogram  Cardiovascular Disease  Colorectal cancer screening  Diabetes screening  Prostate Cancer Screening  Glaucoma screening  Nutrition counseling   Smoking cessation counseling  Patient Instructions (the written plan) was given to the patient.    Varney Biles, LPN  [id number]

## 2015-06-10 NOTE — Patient Instructions (Addendum)
  [name] [name] , Thank you for taking time to come for your Medicare Wellness Visit. I appreciate your ongoing commitment to your health goals. Please review the following plan we discussed and let me know if I can assist you in the future.   Return for follow up with Dr. Derrel Nip.   This is a list of the screening recommended for you and due dates:  Health Maintenance  Topic Date Due  . Flu Shot  08/19/2015  . Hemoglobin A1C  09/11/2015  . Eye exam for diabetics  12/05/2015  . Complete foot exam   03/13/2016  . Tetanus Vaccine  06/28/2020  . Colon Cancer Screening  04/29/2025  . Shingles Vaccine  Completed  . Pneumonia vaccines  Completed

## 2015-06-11 ENCOUNTER — Ambulatory Visit: Payer: 59 | Admitting: Internal Medicine

## 2015-06-11 ENCOUNTER — Other Ambulatory Visit: Payer: 59

## 2015-06-12 ENCOUNTER — Encounter: Payer: Self-pay | Admitting: Internal Medicine

## 2015-06-12 NOTE — Progress Notes (Signed)
  I have reviewed the above information and agree with above.   Darilyn Storbeck, MD 

## 2015-06-13 ENCOUNTER — Encounter: Payer: Self-pay | Admitting: Internal Medicine

## 2015-06-13 ENCOUNTER — Ambulatory Visit (INDEPENDENT_AMBULATORY_CARE_PROVIDER_SITE_OTHER): Payer: Medicare Other | Admitting: Internal Medicine

## 2015-06-13 VITALS — BP 118/76 | HR 73 | Temp 97.8°F | Resp 12 | Ht 68.0 in | Wt 212.5 lb

## 2015-06-13 DIAGNOSIS — I10 Essential (primary) hypertension: Secondary | ICD-10-CM | POA: Diagnosis not present

## 2015-06-13 DIAGNOSIS — R229 Localized swelling, mass and lump, unspecified: Secondary | ICD-10-CM

## 2015-06-13 DIAGNOSIS — E1142 Type 2 diabetes mellitus with diabetic polyneuropathy: Secondary | ICD-10-CM | POA: Diagnosis not present

## 2015-06-13 MED ORDER — GLIPIZIDE 5 MG PO TABS: ORAL_TABLET | ORAL | Status: DC

## 2015-06-13 NOTE — Patient Instructions (Addendum)
Resume the glipizide for your diabetes.  2.5 MG (1/2 TABLET) TWICE DIALY BEFORE BREAKFAST AND EVENIGN MEAL   CHECK YOUR BLOOD SUGARS ONCE OR TWICA WEKK, MORE IF YOU FEEL LIKE YOU ARE HAVING A LOW SUGAR   I have made the referral to Dr Evorn Gong for your dermatology appointment.

## 2015-06-13 NOTE — Progress Notes (Signed)
Subjective:  Patient ID: [name] [name], male    DOB: [birth year]  Age: 75 y.o. MRN: [id number]  CC:   HPI [name] [name] presents for diabetes follow up and evaluation of left shoulder growth.   3 month follow up on diabetes.  Patient has no complaints today.  Patient is following a low glycemic index diet and  Has not been taking gliipizide since last visit per instructions.  Not checking sugars..  No episoeds of hypoglycemic symptoms .  Patient has had an eye exam in the last 12 months and checks feet regularly for signs of infection.  Patient does not walk barefoot outside.  He has diabetic neuropathy . Marland Kitchen Patient is up to date on all recommended vaccinations. He has been indulging  A little bit in bread.     EXERCISING 3 TIMES WEEK AT GYM.  SOME KNEE PAIN AFTER TREADMILL BUT NO SWELLING   NEEDS DERM REFERRAL  TO [facility] FOR SKIN CANCER CHECK  HAS A LARGE PAPULE ON LEFT SHOULDER THAT IS irritating and at times itches. Has been present for several months  LAST EYE EXAM WAS IN November HAS CATARACTS      Outpatient Prescriptions Prior to Visit  Medication Sig Dispense Refill  . amLODipine-benazepril (LOTREL) 10-40 MG capsule TAKE ONE CAPSULE DAILY 90 capsule 1  . aspirin 81 MG tablet Take 81 mg by mouth daily.      . Choline Fenofibrate (FENOFIBRIC ACID) 135 MG CPDR TAKE ONE CAPSULE DAILY 90 capsule 2  . co-enzyme Q-10 30 MG capsule Take 30 mg by mouth daily.      . fish oil-omega-3 fatty acids 1000 MG capsule Take 2 g by mouth daily.      Marland Kitchen gabapentin (NEURONTIN) 300 MG capsule TAKE ONE CAPSULE AT BEDTIME 90 capsule 2  . metFORMIN (GLUCOPHAGE) 1000 MG tablet TAKE ONE TABLET TWICE A DAY WITH MEALS. 180 tablet 3  . Multiple Vitamin (MULTIVITAMIN) tablet Take 1 tablet by mouth daily.      . rosuvastatin (CRESTOR) 40 MG tablet TAKE 1 TABLET EVERY DAY USUALLY IN THE EVENING 30 tablet 4   No facility-administered medications prior to visit.    Review of Systems;  Patient  denies headache, fevers, malaise, unintentional weight loss, skin rash, eye pain, sinus congestion and sinus pain, sore throat, dysphagia,  hemoptysis , cough, dyspnea, wheezing, chest pain, palpitations, orthopnea, edema, abdominal pain, nausea, melena, diarrhea, constipation, flank pain, dysuria, hematuria, urinary  Frequency, nocturia, numbness, tingling, seizures,  Focal weakness, Loss of consciousness,  Tremor, insomnia, depression, anxiety, and suicidal ideation.      Objective:  BP 118/76 mmHg  Pulse 73  Temp(Src) 97.8 F (36.6 C) (Oral)  Resp 12  Ht 5\' 8"  (1.727 m)  Wt 212 lb 8 oz (96.389 kg)  BMI 32.32 kg/m2  SpO2 95%  BP Readings from Last 3 Encounters:  06/13/15 118/76  06/10/15 130/70  04/30/15 115/70    Wt Readings from Last 3 Encounters:  06/13/15 212 lb 8 oz (96.389 kg)  06/10/15 210 lb 12.8 oz (95.618 kg)  04/30/15 213 lb (96.616 kg)    General appearance: alert, cooperative and appears stated age Ears: normal TM's and external ear canals both ears Throat: lips, mucosa, and tongue normal; teeth and gums normal Neck: no adenopathy, no carotid bruit, supple, symmetrical, trachea midline and thyroid not enlarged, symmetric, no tenderness/mass/nodules Back: symmetric, no curvature. ROM normal. No CVA tenderness. Lungs: clear to auscultation bilaterally Heart: regular rate and rhythm,  S1, S2 normal, no murmur, click, rub or gallop Abdomen: soft, non-tender; bowel sounds normal; no masses,  no organomegaly Pulses: 2+ and symmetric Skin: pedunclated pink papule on left shoulder blade approx 8 mm ,  Lymph nodes: Cervical, supraclavicular, and axillary nodes normal.  Lab Results  Component Value Date   HGBA1C 7.4* 06/10/2015   HGBA1C 6.4 03/14/2015   HGBA1C 6.3 11/11/2014    Lab Results  Component Value Date   CREATININE 1.02 06/10/2015   CREATININE 0.93 03/14/2015   CREATININE 1.06 11/11/2014    Lab Results  Component Value Date   GLUCOSE 159* 06/10/2015    CHOL 159 06/10/2015   TRIG 184.0* 06/10/2015   HDL 39.00* 06/10/2015   LDLDIRECT 86.0 06/10/2015   LDLCALC 83 06/10/2015   ALT 24 06/10/2015   AST 21 06/10/2015   NA 137 06/10/2015   K 4.2 06/10/2015   CL 105 06/10/2015   CREATININE 1.02 06/10/2015   BUN 17 06/10/2015   CO2 25 06/10/2015   TSH 0.57 02/27/2014   PSA 2.39 03/14/2015   HGBA1C 7.4* 06/10/2015   MICROALBUR 0.7 03/14/2015    No results found.  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  resume at 2.5 mg bid.   Lab Results  Component Value Date   HGBA1C 7.4* 06/10/2015   Lab Results  Component Value Date   MICROALBUR 0.7 03/14/2015         Relevant Medications   glipiZIDE (GLUCOTROL) 5 MG tablet   Hypertension    Well controlled on current regimen. Renal function stable, no changes today.  Lab Results  Component Value Date   CREATININE 1.02 06/10/2015   Lab Results  Component Value Date   NA 137 06/10/2015   K 4.2 06/10/2015   CL 105 06/10/2015   CO2 25 06/10/2015         Erythematous skin nodule - Primary    Skin CA suspected,  Referral to [facility]      Relevant Orders   Ambulatory referral to Dermatology      I am having Mr. Bellissimo maintain his aspirin, co-enzyme Q-10, fish oil-omega-3 fatty acids, multivitamin, Fenofibric Acid, metFORMIN, amLODipine-benazepril, rosuvastatin, gabapentin, and glipiZIDE.  Meds ordered this encounter  Medications  . glipiZIDE (GLUCOTROL) 5 MG tablet    Sig: TAKE ONE-HALF TABLET TWICE A DAY WITH MEALS    Dispense:  90 tablet    Refill:  1    KEEP ON FILE FOR FUTURE REFILLS    Medications Discontinued During This Encounter  Medication Reason  . glipiZIDE (GLUCOTROL) 5 MG tablet Reorder    Follow-up: Return in about 3 months (around 09/13/2015) for follow up diabetes.   Crecencio Mc, MD

## 2015-06-13 NOTE — Progress Notes (Signed)
Pre-visit discussion using our clinic review tool. No additional management support is needed unless otherwise documented below in the visit note.  

## 2015-06-14 DIAGNOSIS — R229 Localized swelling, mass and lump, unspecified: Secondary | ICD-10-CM | POA: Insufficient documentation

## 2015-06-14 NOTE — Assessment & Plan Note (Signed)
Skin CA suspected,  Referral to [facility]

## 2015-06-14 NOTE — Assessment & Plan Note (Signed)
Loss of control since stopping glipizide.  resume at 2.5 mg bid.   Lab Results  Component Value Date   HGBA1C 7.4* 06/10/2015   Lab Results  Component Value Date   MICROALBUR 0.7 03/14/2015

## 2015-06-14 NOTE — Assessment & Plan Note (Signed)
Well controlled on current regimen. Renal function stable, no changes today.  Lab Results  Component Value Date   CREATININE 1.02 06/10/2015   Lab Results  Component Value Date   NA 137 06/10/2015   K 4.2 06/10/2015   CL 105 06/10/2015   CO2 25 06/10/2015

## 2015-06-25 ENCOUNTER — Other Ambulatory Visit: Payer: Self-pay | Admitting: Internal Medicine

## 2015-07-17 ENCOUNTER — Other Ambulatory Visit: Payer: Self-pay | Admitting: Internal Medicine

## 2015-08-28 ENCOUNTER — Other Ambulatory Visit: Payer: Self-pay | Admitting: Internal Medicine

## 2015-09-11 ENCOUNTER — Telehealth: Payer: Self-pay | Admitting: Internal Medicine

## 2015-09-17 ENCOUNTER — Encounter: Payer: Self-pay | Admitting: Internal Medicine

## 2015-09-17 ENCOUNTER — Ambulatory Visit (INDEPENDENT_AMBULATORY_CARE_PROVIDER_SITE_OTHER): Payer: Medicare Other | Admitting: Internal Medicine

## 2015-09-17 VITALS — BP 124/66 | HR 63 | Temp 97.4°F | Resp 12 | Ht 68.0 in | Wt 214.8 lb

## 2015-09-17 DIAGNOSIS — Z23 Encounter for immunization: Secondary | ICD-10-CM

## 2015-09-17 DIAGNOSIS — I10 Essential (primary) hypertension: Secondary | ICD-10-CM | POA: Diagnosis not present

## 2015-09-17 DIAGNOSIS — E669 Obesity, unspecified: Secondary | ICD-10-CM

## 2015-09-17 DIAGNOSIS — E1159 Type 2 diabetes mellitus with other circulatory complications: Principal | ICD-10-CM

## 2015-09-17 DIAGNOSIS — E1142 Type 2 diabetes mellitus with diabetic polyneuropathy: Secondary | ICD-10-CM

## 2015-09-17 DIAGNOSIS — R635 Abnormal weight gain: Secondary | ICD-10-CM

## 2015-09-17 DIAGNOSIS — E785 Hyperlipidemia, unspecified: Secondary | ICD-10-CM | POA: Diagnosis not present

## 2015-09-17 DIAGNOSIS — E1169 Type 2 diabetes mellitus with other specified complication: Principal | ICD-10-CM

## 2015-09-17 DIAGNOSIS — E119 Type 2 diabetes mellitus without complications: Secondary | ICD-10-CM | POA: Diagnosis not present

## 2015-09-17 LAB — COMPREHENSIVE METABOLIC PANEL
ALBUMIN: 4 g/dL (ref 3.5–5.2)
ALT: 21 U/L (ref 0–53)
AST: 22 U/L (ref 0–37)
Alkaline Phosphatase: 41 U/L (ref 39–117)
BUN: 16 mg/dL (ref 6–23)
CHLORIDE: 105 meq/L (ref 96–112)
CO2: 27 meq/L (ref 19–32)
CREATININE: 1.05 mg/dL (ref 0.40–1.50)
Calcium: 9.3 mg/dL (ref 8.4–10.5)
GFR: 73.51 mL/min (ref >60.00)
Glucose, Bld: 102 mg/dL — ABNORMAL HIGH (ref 70–99)
Potassium: 4.1 mEq/L (ref 3.5–5.1)
SODIUM: 140 meq/L (ref 135–145)
Total Bilirubin: 0.4 mg/dL (ref 0.2–1.2)
Total Protein: 6.7 g/dL (ref 6.0–8.3)

## 2015-09-17 LAB — LIPID PANEL
CHOLESTEROL: 130 mg/dL (ref 0–200)
HDL: 45.1 mg/dL (ref >39.00)
LDL Cholesterol: 58 mg/dL (ref 0–99)
NonHDL: 84.82
TRIGLYCERIDES: 132 mg/dL (ref 0.0–149.0)
Total CHOL/HDL Ratio: 3
VLDL: 26.4 mg/dL (ref 0.0–40.0)

## 2015-09-17 LAB — HEMOGLOBIN A1C: HEMOGLOBIN A1C: 6.3 % (ref 4.6–6.5)

## 2015-09-17 LAB — MICROALBUMIN / CREATININE URINE RATIO
Creatinine,U: 165.2 mg/dL
Microalb Creat Ratio: 0.4 mg/g (ref 0.0–30.0)

## 2015-09-17 LAB — TSH: TSH: 0.48 u[IU]/mL (ref 0.35–4.50)

## 2015-09-17 NOTE — Progress Notes (Signed)
Subjective:  Patient ID: [name] [name], male    DOB: 1940-12-26  Age: 75 y.o. MRN: [id number]  CC: The primary encounter diagnosis was Obesity, diabetes, and hypertension syndrome ([place]). Diagnoses of Encounter for immunization, Weight gain, Hyperlipidemia, Essential hypertension, and Type 2 DM with diabetic neuropathy affecting both sides of body ([place]) were also pertinent to this visit.  HPI [name] [name] presents for 3 month follow up on Type 2 DM, Hypertension, hyperlipidemia and obesity.  He  has been keeping his carbohydrate content well under the recommended  45 carbs per meal by eliminiating sweetened tea and soda and cutting out bagels for breakfast.   cbgs have been consistently under 130.   Has reduced his bourbon intake to 2 shots per night and is going to the gym 3 times per week.  Walks on the treadmill with incline for 1/2 miles,  Then works out with [employer].    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    Received flu vaccine today    Lab Results  Component Value Date   HGBA1C 6.3 09/17/2015   Lab Results  Component Value Date   MICROALBUR <0.7 09/17/2015       Outpatient Medications Prior to Visit  Medication Sig Dispense Refill  . amLODipine-benazepril (LOTREL) 10-40 MG capsule TAKE ONE CAPSULE DAILY 90 capsule 1  . aspirin 81 MG tablet Take 81 mg by mouth daily.      . Choline Fenofibrate (FENOFIBRIC ACID) 135 MG CPDR TAKE ONE CAPSULE DAILY 90 capsule 1  . co-enzyme Q-10 30 MG capsule Take 30 mg by mouth daily.      . fish oil-omega-3 fatty acids 1000 MG capsule Take 2 g by mouth daily.      Marland Kitchen gabapentin (NEURONTIN) 300 MG capsule TAKE ONE CAPSULE AT BEDTIME 90 capsule 2  . glipiZIDE (GLUCOTROL) 5 MG tablet TAKE ONE-HALF TABLET TWICE A DAY WITH MEALS 90 tablet 1  . metFORMIN (GLUCOPHAGE) 1000 MG tablet TAKE ONE TABLET TWICE A DAY WITH MEALS. 180 tablet 3  . Multiple Vitamin (MULTIVITAMIN) tablet Take 1 tablet by mouth  daily.      . rosuvastatin (CRESTOR) 40 MG tablet TAKE 1 TABLET BY MOUTH USUALLY IN THE EVENING 30 tablet 5   No facility-administered medications prior to visit.     Review of Systems;  Patient denies headache, fevers, malaise, unintentional weight loss, skin rash, eye pain, sinus congestion and sinus pain, sore throat, dysphagia,  hemoptysis , cough, dyspnea, wheezing, chest pain, palpitations, orthopnea, edema, abdominal pain, nausea, melena, diarrhea, constipation, flank pain, dysuria, hematuria, urinary  Frequency, nocturia, numbness, tingling, seizures,  Focal weakness, Loss of consciousness,  Tremor, insomnia, depression, anxiety, and suicidal ideation.      Objective:  BP 124/66   Pulse 63   Temp 97.4 F (36.3 C) (Oral)   Resp 12   Ht 5\' 8"  (1.727 m)   Wt 214 lb 12 oz (97.4 kg)   SpO2 93%   BMI 32.65 kg/m   BP Readings from Last 3 Encounters:  09/17/15 124/66  06/13/15 118/76  06/10/15 130/70    Wt Readings from Last 3 Encounters:  09/17/15 214 lb 12 oz (97.4 kg)  06/13/15 212 lb 8 oz (96.4 kg)  06/10/15 210 lb 12.8 oz (95.6 kg)    General appearance: alert, cooperative and appears stated age Ears: normal TM's and external ear canals both ears Throat: lips, mucosa, and tongue  normal; teeth and gums normal Neck: no adenopathy, no carotid bruit, supple, symmetrical, trachea midline and thyroid not enlarged, symmetric, no tenderness/mass/nodules Back: symmetric, no curvature. ROM normal. No CVA tenderness. Lungs: clear to auscultation bilaterally Heart: regular rate and rhythm, S1, S2 normal, no murmur, click, rub or gallop Abdomen: soft, non-tender; bowel sounds normal; no masses,  no organomegaly Pulses: 2+ and symmetric Skin: Skin color, texture, turgor normal. No rashes or lesions Lymph nodes: Cervical, supraclavicular, and axillary nodes normal.  Lab Results  Component Value Date   HGBA1C 6.3 09/17/2015   HGBA1C 7.4 (H) 06/10/2015   HGBA1C 6.4 03/14/2015      Lab Results  Component Value Date   CREATININE 1.05 09/17/2015   CREATININE 1.02 06/10/2015   CREATININE 0.93 03/14/2015    Lab Results  Component Value Date   GLUCOSE 102 (H) 09/17/2015   CHOL 130 09/17/2015   TRIG 132.0 09/17/2015   HDL 45.10 09/17/2015   LDLDIRECT 86.0 06/10/2015   LDLCALC 58 09/17/2015   ALT 21 09/17/2015   AST 22 09/17/2015   NA 140 09/17/2015   K 4.1 09/17/2015   CL 105 09/17/2015   CREATININE 1.05 09/17/2015   BUN 16 09/17/2015   CO2 27 09/17/2015   TSH 0.48 09/17/2015   PSA 2.39 03/14/2015   HGBA1C 6.3 09/17/2015   MICROALBUR <0.7 09/17/2015    No results found.  Assessment & Plan:   Problem List Items Addressed This Visit    Type 2 DM with diabetic neuropathy affecting both sides of body ([place])    Now well-controlled on current medications.  hemoglobin A1c is less than 7.0 . Patient is up-to-date on eye exams  Patient is due for urine microalbumin to creatinine ratio at next visit. Patient is tolerating statin therapy for CAD risk reduction and on ACE/ARB for reduction in proteinuria.    Lab Results  Component Value Date   HGBA1C 6.3 09/17/2015   Lab Results  Component Value Date   MICROALBUR <0.7 09/17/2015        Hyperlipidemia    LDL and triglycerides are at goal on current medications. He has no side effects and liver enzymes are normal. No changes today   Lab Results  Component Value Date   CHOL 130 09/17/2015   HDL 45.10 09/17/2015   LDLCALC 58 09/17/2015   LDLDIRECT 86.0 06/10/2015   TRIG 132.0 09/17/2015   CHOLHDL 3 09/17/2015   Lab Results  Component Value Date   ALT 21 09/17/2015   AST 22 09/17/2015   ALKPHOS 41 09/17/2015   BILITOT 0.4 09/17/2015         Relevant Orders   Lipid panel (Completed)   Hypertension    Well controlled on current regimen. Renal function stable, no changes today.  Lab Results  Component Value Date   CREATININE 1.05 09/17/2015   Lab Results  Component Value Date   NA 140  09/17/2015   K 4.1 09/17/2015   CL 105 09/17/2015   CO2 27 09/17/2015          Other Visit Diagnoses    Obesity, diabetes, and hypertension syndrome ([place])    -  Primary   Relevant Orders   Hemoglobin A1c (Completed)   Comprehensive metabolic panel (Completed)   Microalbumin / creatinine urine ratio (Completed)   Encounter for immunization       Relevant Orders   Flu vaccine HIGH DOSE PF (Completed)   Weight gain       Relevant Orders  TSH (Completed)     A total of 25 minutes of face to face time was spent with patient more than half of which was spent in counselling about the above mentioned conditions  and coordination of care  I am having Mr. Zero maintain his aspirin, co-enzyme Q-10, fish oil-omega-3 fatty acids, multivitamin, metFORMIN, gabapentin, glipiZIDE, Fenofibric Acid, rosuvastatin, and amLODipine-benazepril.  No orders of the defined types were placed in this encounter.   There are no discontinued medications.  Follow-up: No Follow-up on file.   Crecencio Mc, MD

## 2015-09-17 NOTE — Patient Instructions (Signed)
Diabetes and Exercise Exercising regularly is important. It is not just about losing weight. It has many health benefits, such as:  Improving your overall fitness, flexibility, and endurance.  Increasing your bone density.  Helping with weight control.  Decreasing your body fat.  Increasing your muscle strength.  Reducing stress and tension.  Improving your overall health. People with diabetes who exercise gain additional benefits because exercise:  Reduces appetite.  Improves the body's use of blood sugar (glucose).  Helps lower or control blood glucose.  Decreases blood pressure.  Helps control blood lipids (such as cholesterol and triglycerides).  Improves the body's use of the hormone insulin by:  Increasing the body's insulin sensitivity.  Reducing the body's insulin needs.  Decreases the risk for heart disease because exercising:  Lowers cholesterol and triglycerides levels.  Increases the levels of good cholesterol (such as high-density lipoproteins [HDL]) in the body.  Lowers blood glucose levels. YOUR ACTIVITY PLAN  Choose an activity that you enjoy, and set realistic goals. To exercise safely, you should begin practicing any new physical activity slowly, and gradually increase the intensity of the exercise over time. Your health care provider or diabetes educator can help create an activity plan that works for you. General recommendations include:  Encouraging children to engage in at least 60 minutes of physical activity each day.  Stretching and performing strength training exercises, such as yoga or weight lifting, at least 2 times per week.  Performing a total of at least 150 minutes of moderate-intensity exercise each week, such as brisk walking or water aerobics.  Exercising at least 3 days per week, making sure you allow no more than 2 consecutive days to pass without exercising.  Avoiding long periods of inactivity (90 minutes or more). When you  have to spend an extended period of time sitting down, take frequent breaks to walk or stretch. RECOMMENDATIONS FOR EXERCISING WITH TYPE 1 OR TYPE 2 DIABETES   Check your blood glucose before exercising. If blood glucose levels are greater than 240 mg/dL, check for urine ketones. Do not exercise if ketones are present.  Avoid injecting insulin into areas of the body that are going to be exercised. For example, avoid injecting insulin into:  The arms when playing tennis.  The legs when jogging.  Keep a record of:  Food intake before and after you exercise.  Expected peak times of insulin action.  Blood glucose levels before and after you exercise.  The type and amount of exercise you have done.  Review your records with your health care provider. Your health care provider will help you to develop guidelines for adjusting food intake and insulin amounts before and after exercising.  If you take insulin or oral hypoglycemic agents, watch for signs and symptoms of hypoglycemia. They include:  Dizziness.  Shaking.  Sweating.  Chills.  Confusion.  Drink plenty of water while you exercise to prevent dehydration or heat stroke. Body water is lost during exercise and must be replaced.  Talk to your health care provider before starting an exercise program to make sure it is safe for you. Remember, almost any type of activity is better than none.   This information is not intended to replace advice given to you by your health care provider. Make sure you discuss any questions you have with your health care provider.   Document Released: 03/27/2003 Document Revised: 05/21/2014 Document Reviewed: 06/13/2012 Elsevier Interactive Patient Education 2016 [employer] Inc[employer]  

## 2015-09-17 NOTE — Progress Notes (Signed)
Pre-visit discussion using our clinic review tool. No additional management support is needed unless otherwise documented below in the visit note.  

## 2015-09-18 ENCOUNTER — Encounter: Payer: Self-pay | Admitting: Internal Medicine

## 2015-09-20 NOTE — Assessment & Plan Note (Signed)
Now well-controlled on current medications.  hemoglobin A1c is less than 7.0 . Patient is up-to-date on eye exams  Patient is due for urine microalbumin to creatinine ratio at next visit. Patient is tolerating statin therapy for CAD risk reduction and on ACE/ARB for reduction in proteinuria.    Lab Results  Component Value Date   HGBA1C 6.3 09/17/2015   Lab Results  Component Value Date   MICROALBUR <0.7 09/17/2015

## 2015-09-20 NOTE — Assessment & Plan Note (Signed)
LDL and triglycerides are at goal on current medications. He has no side effects and liver enzymes are normal. No changes today   Lab Results  Component Value Date   CHOL 130 09/17/2015   HDL 45.10 09/17/2015   LDLCALC 58 09/17/2015   LDLDIRECT 86.0 06/10/2015   TRIG 132.0 09/17/2015   CHOLHDL 3 09/17/2015   Lab Results  Component Value Date   ALT 21 09/17/2015   AST 22 09/17/2015   ALKPHOS 41 09/17/2015   BILITOT 0.4 09/17/2015

## 2015-09-20 NOTE — Assessment & Plan Note (Signed)
Well controlled on current regimen. Renal function stable, no changes today.  Lab Results  Component Value Date   CREATININE 1.05 09/17/2015   Lab Results  Component Value Date   NA 140 09/17/2015   K 4.1 09/17/2015   CL 105 09/17/2015   CO2 27 09/17/2015

## 2015-12-13 ENCOUNTER — Other Ambulatory Visit: Payer: Self-pay | Admitting: Internal Medicine

## 2015-12-19 ENCOUNTER — Ambulatory Visit (INDEPENDENT_AMBULATORY_CARE_PROVIDER_SITE_OTHER): Payer: Medicare Other | Admitting: Internal Medicine

## 2015-12-19 ENCOUNTER — Encounter: Payer: Self-pay | Admitting: Internal Medicine

## 2015-12-19 DIAGNOSIS — E782 Mixed hyperlipidemia: Secondary | ICD-10-CM | POA: Diagnosis not present

## 2015-12-19 DIAGNOSIS — E1142 Type 2 diabetes mellitus with diabetic polyneuropathy: Secondary | ICD-10-CM

## 2015-12-19 DIAGNOSIS — I10 Essential (primary) hypertension: Secondary | ICD-10-CM

## 2015-12-19 MED ORDER — GABAPENTIN 300 MG PO CAPS: 300.0000 mg | ORAL_CAPSULE | Freq: Every day | ORAL | 2 refills | Status: DC

## 2015-12-19 MED ORDER — AMLODIPINE BESY-BENAZEPRIL HCL 10-40 MG PO CAPS: 1.0000 | ORAL_CAPSULE | Freq: Every day | ORAL | 1 refills | Status: DC

## 2015-12-19 MED ORDER — FENOFIBRIC ACID 135 MG PO CPDR: 1.0000 | DELAYED_RELEASE_CAPSULE | Freq: Every day | ORAL | 1 refills | Status: DC

## 2015-12-19 MED ORDER — GLIPIZIDE 5 MG PO TABS: ORAL_TABLET | ORAL | 1 refills | Status: DC

## 2015-12-19 NOTE — Patient Instructions (Addendum)
If you start seeing seeing BPs over 120/70,  Let me know so we can increase your  Medications

## 2015-12-19 NOTE — Progress Notes (Signed)
Pre-visit discussion using our clinic review tool. No additional management support is needed unless otherwise documented below in the visit note.  

## 2015-12-19 NOTE — Progress Notes (Signed)
Subjective:  Patient ID: [name] [name], male    DOB: Apr 10, 1940  Age: 75 y.o. MRN: [id number]  CC: Diagnoses of Mixed hyperlipidemia, Essential hypertension, and Type 2 DM with diabetic neuropathy affecting both sides of body ([place]) were pertinent to this visit.  HPI [name] [name] presents for 3 month follow up on diabetes.  Patient has no complaints today.  Patient is following a low glycemic index diet and taking all prescribed medications regularly without side effects.  Fasting sugars are rarely checked but have been  less than 140 most of the time and post prandials have been under 160 except on rare occasions. Patient is exercising about 3 times per week and intentionally trying to lose weight .  Patient has had an eye exam in the last 12 months and checks feet regularly for signs of infection.  Patient does not walk barefoot outside due to history of neuropathy, and his wife trims his toenails and checks the bottom of his feet frequently.  Patient is up to date on all recommended vaccinations  Had Moh's procedure done in August by Anne Fu at [place],  On  Wednesday, he had a skin biopsy of the right  neck by [facility] ,  Still wearing compression bandage  Has his BP checked  at the [facility] recently when he donated blood  Was 124/60.  donates blood  twice a year; gives douvle the usual amount because he is  0 negative     Lab Results  Component Value Date   HGBA1C 6.3 09/17/2015     Outpatient Medications Prior to Visit  Medication Sig Dispense Refill  . aspirin 81 MG tablet Take 81 mg by mouth daily.      Marland Kitchen co-enzyme Q-10 30 MG capsule Take 30 mg by mouth daily.      . fish oil-omega-3 fatty acids 1000 MG capsule Take 2 g by mouth daily.      . metFORMIN (GLUCOPHAGE) 1000 MG tablet TAKE ONE TABLET TWICE A DAY WITH MEALS. 180 tablet 1  . Multiple Vitamin (MULTIVITAMIN) tablet Take 1 tablet by mouth daily.      . rosuvastatin (CRESTOR) 40 MG tablet TAKE 1 TABLET BY MOUTH USUALLY  IN THE EVENING 30 tablet 5  . amLODipine-benazepril (LOTREL) 10-40 MG capsule TAKE ONE CAPSULE DAILY 90 capsule 1  . Choline Fenofibrate (FENOFIBRIC ACID) 135 MG CPDR TAKE ONE CAPSULE DAILY 90 capsule 1  . gabapentin (NEURONTIN) 300 MG capsule TAKE ONE CAPSULE AT BEDTIME 90 capsule 2  . glipiZIDE (GLUCOTROL) 5 MG tablet TAKE ONE-HALF TABLET TWICE A DAY WITH MEALS 90 tablet 1   No facility-administered medications prior to visit.     Review of Systems;  Patient denies headache, fevers, malaise, unintentional weight loss, skin rash, eye pain, sinus congestion and sinus pain, sore throat, dysphagia,  hemoptysis , cough, dyspnea, wheezing, chest pain, palpitations, orthopnea, edema, abdominal pain, nausea, melena, diarrhea, constipation, flank pain, dysuria, hematuria, urinary  Frequency, nocturia, , tingling, seizures,  Focal weakness, Loss of consciousness,  Tremor, insomnia, depression, anxiety, and suicidal ideation.      Objective:  BP 140/72   Pulse 78   Temp 97.6 F (36.4 C) (Oral)   Resp 12   Ht 5\' 8"  (1.727 m)   Wt 214 lb 12 oz (97.4 kg)   SpO2 93%   BMI 32.65 kg/m   BP Readings from Last 3 Encounters:  12/19/15 140/72  09/17/15 124/66  06/13/15 118/76    Wt  Readings from Last 3 Encounters:  12/19/15 214 lb 12 oz (97.4 kg)  09/17/15 214 lb 12 oz (97.4 kg)  06/13/15 212 lb 8 oz (96.4 kg)    General appearance: alert, cooperative and appears stated age Ears: normal TM's and external ear canals both ears Throat: lips, mucosa, and tongue normal; teeth and gums normal Neck: no adenopathy, no carotid bruit, supple, symmetrical, trachea midline and thyroid not enlarged, symmetric, no tenderness/mass/nodules Back: symmetric, no curvature. ROM normal. No CVA tenderness. Lungs: clear to auscultation bilaterally Heart: regular rate and rhythm, S1, S2 normal, no murmur, click, rub or gallop Abdomen: soft, non-tender; bowel sounds normal; no masses,  no organomegaly Pulses: 2+  and symmetric Skin: Skin color, texture, turgor normal. No rashes or lesions Lymph nodes: Cervical, supraclavicular, and axillary nodes normal.  Lab Results  Component Value Date   HGBA1C 6.3 09/17/2015   HGBA1C 7.4 (H) 06/10/2015   HGBA1C 6.4 03/14/2015    Lab Results  Component Value Date   CREATININE 1.05 09/17/2015   CREATININE 1.02 06/10/2015   CREATININE 0.93 03/14/2015    Lab Results  Component Value Date   GLUCOSE 102 (H) 09/17/2015   CHOL 130 09/17/2015   TRIG 132.0 09/17/2015   HDL 45.10 09/17/2015   LDLDIRECT 86.0 06/10/2015   LDLCALC 58 09/17/2015   ALT 21 09/17/2015   AST 22 09/17/2015   NA 140 09/17/2015   K 4.1 09/17/2015   CL 105 09/17/2015   CREATININE 1.05 09/17/2015   BUN 16 09/17/2015   CO2 27 09/17/2015   TSH 0.48 09/17/2015   PSA 2.39 03/14/2015   HGBA1C 6.3 09/17/2015   MICROALBUR <0.7 09/17/2015    No results found.  Assessment & Plan:   Problem List Items Addressed This Visit    Hyperlipidemia    LDL and triglycerides are controlled with rosuvastatin and fenofibrate, and he is taking a baby aspirin daily.  He has no side effects and liver enzymes are normal. No changes today   Lab Results  Component Value Date   CHOL 130 09/17/2015   HDL 45.10 09/17/2015   LDLCALC 58 09/17/2015   LDLDIRECT 86.0 06/10/2015   TRIG 132.0 09/17/2015   CHOLHDL 3 09/17/2015   Lab Results  Component Value Date   ALT 21 09/17/2015   AST 22 09/17/2015   ALKPHOS 41 09/17/2015   BILITOT 0.4 09/17/2015         Relevant Medications   amLODipine-benazepril (LOTREL) 10-40 MG capsule   Choline Fenofibrate (FENOFIBRIC ACID) 135 MG CPDR   Hypertension    Well controlled on current regimen of Lotrel .  no changes today.      Relevant Medications   amLODipine-benazepril (LOTREL) 10-40 MG capsule   Choline Fenofibrate (FENOFIBRIC ACID) 135 MG CPDR   Type 2 DM with diabetic neuropathy affecting both sides of body ([place])     well-controlled on current  medications.  hemoglobin A1c is less than 7.0 . Patient is up-to-date on eye exams  Patient has no proteinuria , Is taking a baby aspirin,  tolerating statin therapy for CAD risk reduction and an ACE/ARB . Will repeat labs prior to next visit.  Lab Results  Component Value Date   HGBA1C 6.3 09/17/2015   Lab Results  Component Value Date   MICROALBUR <0.7 09/17/2015        Relevant Medications   amLODipine-benazepril (LOTREL) 10-40 MG capsule   glipiZIDE (GLUCOTROL) 5 MG tablet      I have changed Mr. Stalder's amLODipine-benazepril,  Fenofibric Acid, and gabapentin. I am also having him maintain his aspirin, co-enzyme Q-10, fish oil-omega-3 fatty acids, multivitamin, rosuvastatin, metFORMIN, and glipiZIDE.  Meds ordered this encounter  Medications  . amLODipine-benazepril (LOTREL) 10-40 MG capsule    Sig: Take 1 capsule by mouth daily.    Dispense:  90 capsule    Refill:  1  . Choline Fenofibrate (FENOFIBRIC ACID) 135 MG CPDR    Sig: Take 1 capsule by mouth daily.    Dispense:  90 capsule    Refill:  1  . gabapentin (NEURONTIN) 300 MG capsule    Sig: Take 1 capsule (300 mg total) by mouth at bedtime.    Dispense:  90 capsule    Refill:  2  . glipiZIDE (GLUCOTROL) 5 MG tablet    Sig: TAKE ONE-HALF TABLET TWICE A DAY WITH MEALS    Dispense:  90 tablet    Refill:  1    KEEP ON FILE FOR FUTURE REFILLS    Medications Discontinued During This Encounter  Medication Reason  . amLODipine-benazepril (LOTREL) 10-40 MG capsule Reorder  . Choline Fenofibrate (FENOFIBRIC ACID) 135 MG CPDR Reorder  . gabapentin (NEURONTIN) 300 MG capsule Reorder  . glipiZIDE (GLUCOTROL) 5 MG tablet Reorder    Follow-up: No Follow-up on file.   Crecencio Mc, MD

## 2015-12-21 NOTE — Assessment & Plan Note (Signed)
well-controlled on current medications.  hemoglobin A1c is less than 7.0 . Patient is up-to-date on eye exams  Patient has no proteinuria , Is taking a baby aspirin,  tolerating statin therapy for CAD risk reduction and an ACE/ARB . Will repeat labs prior to next visit.  Lab Results  Component Value Date   HGBA1C 6.3 09/17/2015   Lab Results  Component Value Date   MICROALBUR <0.7 09/17/2015

## 2015-12-21 NOTE — Assessment & Plan Note (Addendum)
LDL and triglycerides are controlled with rosuvastatin and fenofibrate, and he is taking a baby aspirin daily.  He has no side effects and liver enzymes are normal. No changes today   Lab Results  Component Value Date   CHOL 130 09/17/2015   HDL 45.10 09/17/2015   LDLCALC 58 09/17/2015   LDLDIRECT 86.0 06/10/2015   TRIG 132.0 09/17/2015   CHOLHDL 3 09/17/2015   Lab Results  Component Value Date   ALT 21 09/17/2015   AST 22 09/17/2015   ALKPHOS 41 09/17/2015   BILITOT 0.4 09/17/2015

## 2015-12-21 NOTE — Assessment & Plan Note (Signed)
Well controlled on current regimen of Lotrel .  no changes today.

## 2016-01-02 LAB — HM DIABETES EYE EXAM

## 2016-01-05 ENCOUNTER — Encounter: Payer: Self-pay | Admitting: Internal Medicine

## 2016-03-19 ENCOUNTER — Ambulatory Visit: Payer: Medicare Other | Admitting: Internal Medicine

## 2016-04-07 ENCOUNTER — Other Ambulatory Visit: Payer: Self-pay | Admitting: Internal Medicine

## 2016-04-21 ENCOUNTER — Encounter: Payer: Self-pay | Admitting: Internal Medicine

## 2016-04-21 ENCOUNTER — Ambulatory Visit (INDEPENDENT_AMBULATORY_CARE_PROVIDER_SITE_OTHER): Payer: Medicare Other | Admitting: Internal Medicine

## 2016-04-21 VITALS — BP 116/72 | HR 71 | Temp 97.5°F | Resp 15 | Ht 68.0 in | Wt 219.2 lb

## 2016-04-21 DIAGNOSIS — E119 Type 2 diabetes mellitus without complications: Secondary | ICD-10-CM

## 2016-04-21 DIAGNOSIS — E1142 Type 2 diabetes mellitus with diabetic polyneuropathy: Secondary | ICD-10-CM | POA: Diagnosis not present

## 2016-04-21 DIAGNOSIS — Z86010 Personal history of colonic polyps: Secondary | ICD-10-CM

## 2016-04-21 DIAGNOSIS — E1169 Type 2 diabetes mellitus with other specified complication: Secondary | ICD-10-CM | POA: Diagnosis not present

## 2016-04-21 DIAGNOSIS — I10 Essential (primary) hypertension: Secondary | ICD-10-CM

## 2016-04-21 DIAGNOSIS — E782 Mixed hyperlipidemia: Secondary | ICD-10-CM | POA: Diagnosis not present

## 2016-04-21 DIAGNOSIS — E669 Obesity, unspecified: Secondary | ICD-10-CM | POA: Diagnosis not present

## 2016-04-21 DIAGNOSIS — Z125 Encounter for screening for malignant neoplasm of prostate: Secondary | ICD-10-CM

## 2016-04-21 LAB — COMPREHENSIVE METABOLIC PANEL
ALT: 22 U/L (ref 0–53)
AST: 22 U/L (ref 0–37)
Albumin: 4.2 g/dL (ref 3.5–5.2)
Alkaline Phosphatase: 48 U/L (ref 39–117)
BUN: 14 mg/dL (ref 6–23)
CHLORIDE: 106 meq/L (ref 96–112)
CO2: 27 meq/L (ref 19–32)
Calcium: 10.1 mg/dL (ref 8.4–10.5)
Creatinine, Ser: 0.99 mg/dL (ref 0.40–1.50)
GFR: 78.54 mL/min (ref >60.00)
GLUCOSE: 128 mg/dL — AB (ref 70–99)
POTASSIUM: 4.8 meq/L (ref 3.5–5.1)
SODIUM: 139 meq/L (ref 135–145)
TOTAL PROTEIN: 6.7 g/dL (ref 6.0–8.3)
Total Bilirubin: 0.4 mg/dL (ref 0.2–1.2)

## 2016-04-21 LAB — LIPID PANEL
CHOLESTEROL: 140 mg/dL (ref 0–200)
HDL: 43.2 mg/dL (ref >39.00)
LDL CALC: 57 mg/dL (ref 0–99)
NonHDL: 96.65
TRIGLYCERIDES: 197 mg/dL — AB (ref 0.0–149.0)
Total CHOL/HDL Ratio: 3
VLDL: 39.4 mg/dL (ref 0.0–40.0)

## 2016-04-21 LAB — PSA, MEDICARE: PSA: 1.78 ng/ml (ref 0.10–4.00)

## 2016-04-21 NOTE — Patient Instructions (Addendum)
Your diabetes remains under excellent control !     your cholesterol and other labs will be checked today . Please continue your current medications. return in 6 months for follow up on diabetes and make sure you are seeing your eye doctor at least once a year for a dilated retina exam to monitor for diabetic retinopathy   For your allergies ,  You can use Benadryl but you should also consider adding one of these newer second generation antihistamines that are longer acting, non sedating and  available OTC:  Generic  Zyrtec, which is cetirizine.    generic Allegra , available generically as fexofenadine ; comes in 60 mg and 180 mg once daily strengths.    Generic Claritin :  also available as loratidine .   You have gained 9 lbs since May 2017!!!!    Get back to working out to 3 days

## 2016-04-21 NOTE — Progress Notes (Signed)
Subjective:  Patient ID: [name] [name], male    DOB: May 12, 1940  Age: 76 y.o. MRN: [id number]  CC: The primary encounter diagnosis was Diabetes mellitus without complication ([place]). Diagnoses of Type 2 DM with diabetic neuropathy affecting both sides of body ([place]), Prostate cancer screening, Essential hypertension, Obesity (BMI 30.0-34.9), Mixed hyperlipidemia due to type 2 diabetes mellitus ([place]), and History of colonic polyps were also pertinent to this visit.  HPI [name] [name] presents for 6 month follow up on diabetes.  Patient has no complaints today.  Patient is following a low glycemic index diet and taking all prescribed medications regularly without side effects.  Fasting sugars have been under less than 140 most of the time and post prandials have been under 160 except on rare occasions. Patient is exercising about 3 times per week and intentionally trying to lose weight .  Patient has had an eye exam in the last 12 months and checks feet regularly for signs of infection.  Patient does not walk barefoot outside,  And denies an numbness tingling or burning in feet. Patient is up to date on all recommended vaccinations  Some allergic rhinitis, complicated by history of nasal bone fracture remotely causing chronic congestion of right side.   Weight gain of 5 l bs addressed .  He has reduced his workouts due ot his wife's surgery but plans to increase his activity level.   Hypertension: patient checks blood pressure twice weekly at home.  Readings have been for the most part > 140/80 at rest . Patient is following a reduce salt diet most days and is taking medications as prescribed      Lab Results  Component Value Date   HGBA1C 6.4 04/22/2016   Lab Results  Component Value Date   MICROALBUR <0.7 09/17/2015      Outpatient Medications Prior to Visit  Medication Sig Dispense Refill  . amLODipine-benazepril (LOTREL) 10-40 MG capsule Take 1 capsule by mouth daily. 90 capsule 1    . aspirin 81 MG tablet Take 81 mg by mouth daily.      . Choline Fenofibrate (FENOFIBRIC ACID) 135 MG CPDR Take 1 capsule by mouth daily. 90 capsule 1  . co-enzyme Q-10 30 MG capsule Take 30 mg by mouth daily.      . fish oil-omega-3 fatty acids 1000 MG capsule Take 2 g by mouth daily.      Marland Kitchen gabapentin (NEURONTIN) 300 MG capsule Take 1 capsule (300 mg total) by mouth at bedtime. 90 capsule 2  . glipiZIDE (GLUCOTROL) 5 MG tablet TAKE ONE-HALF TABLET TWICE A DAY WITH MEALS 90 tablet 1  . metFORMIN (GLUCOPHAGE) 1000 MG tablet TAKE ONE TABLET TWICE A DAY WITH MEALS. 180 tablet 1  . Multiple Vitamin (MULTIVITAMIN) tablet Take 1 tablet by mouth daily.      . rosuvastatin (CRESTOR) 40 MG tablet TAKE 1 TABLET BY MOUTH USUALLY IN THE EVENING 30 tablet 5  . rosuvastatin (CRESTOR) 40 MG tablet TAKE 1 TABLET BY MOUTH USUALLY IN THE EVENING 30 tablet 2   No facility-administered medications prior to visit.     Review of Systems;  Patient denies headache, fevers, malaise, unintentional weight loss, skin rash, eye pain, sinus congestion and sinus pain, sore throat, dysphagia,  hemoptysis , cough, dyspnea, wheezing, chest pain, palpitations, orthopnea, edema, abdominal pain, nausea, melena, diarrhea, constipation, flank pain, dysuria, hematuria, urinary  Frequency, nocturia, numbness, tingling, seizures,  Focal weakness, Loss of consciousness,  Tremor, insomnia, depression, anxiety, and suicidal  ideation.      Objective:  BP 116/72   Pulse 71   Temp 97.5 F (36.4 C) (Oral)   Resp 15   Ht 5\' 8"  (1.727 m)   Wt 219 lb 3.2 oz (99.4 kg)   SpO2 92%   BMI 33.33 kg/m   BP Readings from Last 3 Encounters:  04/21/16 116/72  12/19/15 140/72  09/17/15 124/66    Wt Readings from Last 3 Encounters:  04/21/16 219 lb 3.2 oz (99.4 kg)  12/19/15 214 lb 12 oz (97.4 kg)  09/17/15 214 lb 12 oz (97.4 kg)    General appearance: alert, cooperative and appears stated age Ears: normal TM's and external ear  canals both ears Throat: lips, mucosa, and tongue normal; teeth and gums normal Neck: no adenopathy, no carotid bruit, supple, symmetrical, trachea midline and thyroid not enlarged, symmetric, no tenderness/mass/nodules Back: symmetric, no curvature. ROM normal. No CVA tenderness. Lungs: clear to auscultation bilaterally Heart: regular rate and rhythm, S1, S2 normal, no murmur, click, rub or gallop Abdomen: soft, non-tender; bowel sounds normal; no masses,  no organomegaly Pulses: 2+ and symmetric Skin: Skin color, texture, turgor normal. No rashes or lesions Lymph nodes: Cervical, supraclavicular, and axillary nodes normal.  Lab Results  Component Value Date   HGBA1C 6.4 04/22/2016   HGBA1C 6.3 09/17/2015   HGBA1C 7.4 (H) 06/10/2015    Lab Results  Component Value Date   CREATININE 0.99 04/21/2016   CREATININE 1.05 09/17/2015   CREATININE 1.02 06/10/2015    Lab Results  Component Value Date   GLUCOSE 128 (H) 04/21/2016   CHOL 140 04/21/2016   TRIG 197.0 (H) 04/21/2016   HDL 43.20 04/21/2016   LDLDIRECT 86.0 06/10/2015   LDLCALC 57 04/21/2016   ALT 22 04/21/2016   AST 22 04/21/2016   NA 139 04/21/2016   K 4.8 04/21/2016   CL 106 04/21/2016   CREATININE 0.99 04/21/2016   BUN 14 04/21/2016   CO2 27 04/21/2016   TSH 0.48 09/17/2015   PSA 1.78 04/21/2016   HGBA1C 6.4 04/22/2016   MICROALBUR <0.7 09/17/2015    No results found.  Assessment & Plan:   Problem List Items Addressed This Visit    History of colonic polyps    His last colonoscopy was in 2014; 5 yr follow up is due in 2019.       Hypertension    Well controlled on current regimen. Renal function stable, no changes today.  Lab Results  Component Value Date   CREATININE 0.99 04/21/2016   Lab Results  Component Value Date   NA 139 04/21/2016   K 4.8 04/21/2016   CL 106 04/21/2016   CO2 27 04/21/2016         Mixed hyperlipidemia due to type 2 diabetes mellitus ([facility])    LDL and triglycerides  are controlled with rosuvastatin and fenofibrate, and he is taking a baby aspirin daily.  He has no side effects and liver enzymes are normal. No changes today   Lab Results  Component Value Date   CHOL 140 04/21/2016   HDL 43.20 04/21/2016   LDLCALC 57 04/21/2016   LDLDIRECT 86.0 06/10/2015   TRIG 197.0 (H) 04/21/2016   CHOLHDL 3 04/21/2016   Lab Results  Component Value Date   ALT 22 04/21/2016   AST 22 04/21/2016   ALKPHOS 48 04/21/2016   BILITOT 0.4 04/21/2016         Obesity (BMI 30.0-34.9)    Body mass index is 33.33 kg/m.  I have addressed  BMI and recommended a low glycemic index diet utilizing smaller more frequent meals to increase metabolism.  I have also recommended that patient start exercising with a goal of 30 minutes of aerobic exercise a minimum of 5 days per week.       Prostate cancer screening    Annual PSA has been done and is normal .  Lab Results  Component Value Date   PSA 1.78 04/21/2016   PSA 2.39 03/14/2015   PSA 1.23 02/27/2014         Relevant Orders   PSA, Medicare (Completed)   Type 2 DM with diabetic neuropathy affecting both sides of body ([place])     well-controlled on current medications.  hemoglobin A1c is less than 7.0 . Patient is up-to-date on eye exams  Patient has no proteinuria , Is taking a baby aspirin,  tolerating statin therapy for CAD risk reduction and an ACE/ARB .  Lab Results  Component Value Date   HGBA1C 6.4 04/22/2016   Lab Results  Component Value Date   MICROALBUR <0.7 09/17/2015         Relevant Orders   POCT HgB A1C (Completed)    Other Visit Diagnoses    Diabetes mellitus without complication ([place])    -  Primary   Relevant Orders   Lipid panel (Completed)   Comprehensive metabolic panel (Completed)     A total of 25 minutes of face to face time was spent with patient more than half of which was spent in counselling about the above mentioned conditions  and coordination of care  I am having Mr.  Every maintain his aspirin, co-enzyme Q-10, fish oil-omega-3 fatty acids, multivitamin, rosuvastatin, metFORMIN, amLODipine-benazepril, Fenofibric Acid, gabapentin, glipiZIDE, and rosuvastatin.  No orders of the defined types were placed in this encounter.   There are no discontinued medications.  Follow-up: Return in about 6 months (around 10/21/2016).   Crecencio Mc, MD

## 2016-04-21 NOTE — Progress Notes (Signed)
Pre visit review using our clinic review tool, if applicable. No additional management support is needed unless otherwise documented below in the visit note. 

## 2016-04-22 LAB — POCT GLYCOSYLATED HEMOGLOBIN (HGB A1C): HEMOGLOBIN A1C: 6.4

## 2016-04-22 NOTE — Assessment & Plan Note (Signed)
LDL and triglycerides are controlled with rosuvastatin and fenofibrate, and he is taking a baby aspirin daily.  He has no side effects and liver enzymes are normal. No changes today   Lab Results  Component Value Date   CHOL 140 04/21/2016   HDL 43.20 04/21/2016   LDLCALC 57 04/21/2016   LDLDIRECT 86.0 06/10/2015   TRIG 197.0 (H) 04/21/2016   CHOLHDL 3 04/21/2016   Lab Results  Component Value Date   ALT 22 04/21/2016   AST 22 04/21/2016   ALKPHOS 48 04/21/2016   BILITOT 0.4 04/21/2016

## 2016-04-22 NOTE — Assessment & Plan Note (Signed)
Well controlled on current regimen. Renal function stable, no changes today.  Lab Results  Component Value Date   CREATININE 0.99 04/21/2016   Lab Results  Component Value Date   NA 139 04/21/2016   K 4.8 04/21/2016   CL 106 04/21/2016   CO2 27 04/21/2016

## 2016-04-22 NOTE — Assessment & Plan Note (Signed)
Body mass index is 33.33 kg/m. I have addressed  BMI and recommended a low glycemic index diet utilizing smaller more frequent meals to increase metabolism.  I have also recommended that patient start exercising with a goal of 30 minutes of aerobic exercise a minimum of 5 days per week.

## 2016-04-22 NOTE — Assessment & Plan Note (Signed)
Annual PSA has been done and is normal .  Lab Results  Component Value Date   PSA 1.78 04/21/2016   PSA 2.39 03/14/2015   PSA 1.23 02/27/2014

## 2016-04-22 NOTE — Assessment & Plan Note (Signed)
His last colonoscopy was in 2014; 5 yr follow up is due in 2019.

## 2016-04-22 NOTE — Assessment & Plan Note (Signed)
well-controlled on current medications.  hemoglobin A1c is less than 7.0 . Patient is up-to-date on eye exams  Patient has no proteinuria , Is taking a baby aspirin,  tolerating statin therapy for CAD risk reduction and an ACE/ARB .  Lab Results  Component Value Date   HGBA1C 6.4 04/22/2016   Lab Results  Component Value Date   MICROALBUR <0.7 09/17/2015

## 2016-04-25 ENCOUNTER — Encounter: Payer: Self-pay | Admitting: Internal Medicine

## 2016-04-26 ENCOUNTER — Telehealth: Payer: Self-pay | Admitting: Internal Medicine

## 2016-04-26 NOTE — Telephone Encounter (Signed)
Pt called wanting to get another copy of a low carb diet. Pt will come and pick it up.   Call pt when it's ready @ [phone]. Thank you!

## 2016-04-26 NOTE — Telephone Encounter (Signed)
Please advise 

## 2016-04-26 NOTE — Telephone Encounter (Signed)
Printed and put up front for pick up

## 2016-06-09 ENCOUNTER — Ambulatory Visit (INDEPENDENT_AMBULATORY_CARE_PROVIDER_SITE_OTHER): Payer: Medicare Other

## 2016-06-09 VITALS — BP 136/70 | HR 88 | Temp 97.7°F | Resp 14 | Ht 68.0 in | Wt 220.8 lb

## 2016-06-09 DIAGNOSIS — Z0000 Encounter for general adult medical examination without abnormal findings: Secondary | ICD-10-CM

## 2016-06-09 DIAGNOSIS — Z1389 Encounter for screening for other disorder: Secondary | ICD-10-CM | POA: Diagnosis not present

## 2016-06-09 DIAGNOSIS — Z1331 Encounter for screening for depression: Secondary | ICD-10-CM

## 2016-06-09 NOTE — Progress Notes (Signed)
Subjective:   [name] [name] is a 76 y.o. male who presents for Medicare Annual/Subsequent preventive examination.  Review of Systems:  No ROS.  Medicare Wellness Visit. Cardiac Risk Factors include: advanced age (>47men, >15 women);male gender;obesity (BMI >30kg/m2);diabetes mellitus;hypertension     Objective:    Vitals: BP 136/70 (BP Location: Left Arm, Patient Position: Sitting, Cuff Size: Normal)   Pulse 88   Temp 97.7 F (36.5 C) (Oral)   Resp 14   Ht 5\' 8"  (1.727 m)   Wt 220 lb 12.8 oz (100.2 kg)   SpO2 95%   BMI 33.57 kg/m   Body mass index is 33.57 kg/m.  Tobacco History  Smoking Status  . Former Smoker  . Types: Cigarettes  . Quit date: 10/18/1980  Smokeless Tobacco  . Former Systems developer  . Types: Chew  . Quit date: 10/19/1990     Counseling given: Not Answered   Past Medical History:  Diagnosis Date  . Diabetes mellitus    Type 2  . Hyperlipidemia   . Hypertension   . Peripheral neuropathy 01/2007   positive EMG studies, negative workup for causes  . Positive PPD, treated 1970   Past Surgical History:  Procedure Laterality Date  . COLONOSCOPY  2004, 2014   Dr Bary Castilla  . COLONOSCOPY WITH PROPOFOL N/A 04/30/2015   Procedure: COLONOSCOPY WITH PROPOFOL;  Surgeon: Robert Bellow, MD;  Location: [facility] ENDOSCOPY;  Service: Endoscopy;  Laterality: N/A;  . ROTATOR CUFF REPAIR  2005   right (Dr. Francia Greaves)  . TONSILLECTOMY     Family History  Problem Relation Age of Onset  . Heart attack Father   . Dementia Sister   . Kidney disease Sister   . Cancer Neg Hx    History  Sexual Activity  . Sexual activity: Not Currently    Outpatient Encounter Prescriptions as of 06/09/2016  Medication Sig  . amLODipine-benazepril (LOTREL) 10-40 MG capsule Take 1 capsule by mouth daily.  Marland Kitchen aspirin 81 MG tablet Take 81 mg by mouth daily.    . Choline Fenofibrate (FENOFIBRIC ACID) 135 MG CPDR Take 1 capsule by mouth daily.  Marland Kitchen co-enzyme Q-10 30 MG capsule Take 30 mg by  mouth daily.    . fish oil-omega-3 fatty acids 1000 MG capsule Take 2 g by mouth daily.    Marland Kitchen gabapentin (NEURONTIN) 300 MG capsule Take 1 capsule (300 mg total) by mouth at bedtime.  Marland Kitchen glipiZIDE (GLUCOTROL) 5 MG tablet TAKE ONE-HALF TABLET TWICE A DAY WITH MEALS  . metFORMIN (GLUCOPHAGE) 1000 MG tablet TAKE ONE TABLET TWICE A DAY WITH MEALS.  . Multiple Vitamin (MULTIVITAMIN) tablet Take 1 tablet by mouth daily.    . rosuvastatin (CRESTOR) 40 MG tablet TAKE 1 TABLET BY MOUTH USUALLY IN THE EVENING  . rosuvastatin (CRESTOR) 40 MG tablet TAKE 1 TABLET BY MOUTH USUALLY IN THE EVENING   No facility-administered encounter medications on file as of 06/09/2016.     Activities of Daily Living In your present state of health, do you have any difficulty performing the following activities: 06/09/2016 06/10/2015  Hearing? N N  Vision? N N  Difficulty concentrating or making decisions? N N  Walking or climbing stairs? Y Y  Dressing or bathing? N N  Doing errands, shopping? N N  Preparing Food and eating ? N N  Using the Toilet? N N  In the past six months, have you accidently leaked urine? N N  Do you have problems with loss of bowel control? N  N  Managing your Medications? N N  Managing your Finances? N N  Housekeeping or managing your Housekeeping? N N  Some recent data might be hidden    Patient Care Team: Crecencio Mc, MD as PCP - General (Internal Medicine) Bary Castilla, Forest Gleason, MD (General Surgery) Crecencio Mc, MD (Internal Medicine)   Assessment:    This is a routine wellness examination for [name] [name]. The goal of the wellness visit is to assist the patient how to close the gaps in care and create a preventative care plan for the patient.   Osteoporosis risk reviewed.  Medications reviewed; taking without issues or barriers.  Safety issues reviewed; smoke detectors in the home. Firearms locked up in the home. Wears seatbelts when driving or riding with others. Patient does  wear sunscreen or protective clothing when in direct sunlight. No violence in the home.  Depression- PHQ 2 &9 complete.  No signs/symptoms or verbal communication regarding little pleasure in doing things, feeling down, depressed or hopeless. No changes in sleeping, energy, eating, concentrating.  No thoughts of self harm or harm towards others.  Time spent on this topic is 8 minutes.   Patient is alert, normal appearance, oriented to person/place/and time. Correctly identified the president of the [place], recall of 2/3 words, and performing simple calculations.  Patient displays appropriate judgement and can read correct time from watch face.  No new identified risk were noted.  No failures at ADL's or IADL's.   BMI- discussed the importance of a healthy diet, water intake and exercise. Educational material provided.   Daily fluid intake: 5 cups of caffeine, 5 cups of water  HTN- followed by PCP.  Dental- every 4 months.  Eye- Visual acuity not assessed per patient preference since they have regular follow up with the ophthalmologist.  Wears corrective lenses.  Sleep patterns- Sleeps 6 hours at night.  Wakes feeling rested. CPAP  not in use.  Health maintenance gaps- closed.  Patient Concerns: None at this time. Follow up with PCP as needed.  Exercise Activities and Dietary recommendations Current Exercise Habits: Home exercise routine, Type of exercise: calisthenics;stretching, Time (Minutes): 60, Frequency (Times/Week): 3, Weekly Exercise (Minutes/Week): 180, Intensity: Mild  Goals    . Healthy Lifestyle          Stay hydrated and drink plenty of fluids.  Increase water intake. Low carb foods.  Lean meats and vegetables. Stay active and continue exercising.      Fall Risk Fall Risk  06/09/2016 09/17/2015 06/13/2015 06/10/2015 11/11/2014  Falls in the past year? No No No No No  Risk for fall due to : - - - - -   Depression Screen PHQ 2/9 Scores 06/09/2016 09/17/2015 06/13/2015  06/10/2015  PHQ - 2 Score 0 0 0 0  PHQ- 9 Score 0 - - -    Cognitive Function MMSE - Mini Mental State Exam 06/09/2016 06/10/2015  Orientation to time 5 5  Orientation to Place 5 5  Registration 3 3  Attention/ Calculation 5 5  Recall 3 3  Language- name 2 objects 2 2  Language- repeat 1 1  Language- follow 3 step command 3 3  Language- read & follow direction 1 1  Write a sentence 1 1  Copy design 1 1  Total score 30 30        Immunization History  Administered Date(s) Administered  . Influenza Split 09/29/2011  . Influenza, High Dose Seasonal PF 11/11/2014, 09/17/2015  . Influenza,inj,Quad PF,36+ Mos 10/31/2012,  10/13/2013  . Pneumococcal Conjugate-13 04/16/2013  . Pneumococcal Polysaccharide-23 07/15/2010, 03/14/2015  . Tdap 06/29/2010  . Zoster 05/11/2010   Screening Tests Health Maintenance  Topic Date Due  . INFLUENZA VACCINE  08/18/2016  . HEMOGLOBIN A1C  10/22/2016  . OPHTHALMOLOGY EXAM  01/01/2017  . FOOT EXAM  04/21/2017  . TETANUS/TDAP  06/28/2020  . COLONOSCOPY  04/29/2025  . PNA vac Low Risk Adult  Completed      Plan:    End of life planning; Advance aging; Advanced directives discussed. Copy of current HCPOA/Living Will requested.    I have personally reviewed and noted the following in the patient's chart:   . Medical and social history . Use of alcohol, tobacco or illicit drugs  . Current medications and supplements . Functional ability and status . Nutritional status . Physical activity . Advanced directives . List of other physicians . Hospitalizations, surgeries, and ER visits in previous 12 months . Vitals . Screenings to include cognitive, depression, and falls . Referrals and appointments  In addition, I have reviewed and discussed with patient certain preventive protocols, quality metrics, and best practice recommendations. A written personalized care plan for preventive services as well as general preventive health recommendations  were provided to patient.     Varney Biles, LPN  [phone]

## 2016-06-09 NOTE — Patient Instructions (Addendum)
  Mr. [name] [name] , Thank you for taking time to come for your Medicare Wellness Visit. I appreciate your ongoing commitment to your health goals. Please review the following plan we discussed and let me know if I can assist you in the future.   .Follow up with Dr. Derrel Nip as needed.    Bring a copy of your [name] and/or Living Will to be scanned into chart.  Have a great day!  These are the goals we discussed: Goals    . Healthy Lifestyle          Stay hydrated and drink plenty of fluids.  Increase water intake. Low carb foods.  Lean meats and vegetables. Stay active and continue exercising.       This is a list of the screening recommended for you and due dates:  Health Maintenance  Topic Date Due  . Flu Shot  08/18/2016  . Hemoglobin A1C  10/22/2016  . Eye exam for diabetics  01/01/2017  . Complete foot exam   04/21/2017  . Tetanus Vaccine  06/28/2020  . Colon Cancer Screening  04/29/2025  . Pneumonia vaccines  Completed

## 2016-06-10 ENCOUNTER — Other Ambulatory Visit: Payer: Self-pay | Admitting: Internal Medicine

## 2016-07-12 ENCOUNTER — Other Ambulatory Visit: Payer: Self-pay | Admitting: Internal Medicine

## 2016-08-23 ENCOUNTER — Other Ambulatory Visit: Payer: Self-pay | Admitting: Internal Medicine

## 2016-09-07 ENCOUNTER — Other Ambulatory Visit: Payer: Self-pay | Admitting: Internal Medicine

## 2016-10-11 ENCOUNTER — Other Ambulatory Visit: Payer: Self-pay | Admitting: Internal Medicine

## 2016-10-22 ENCOUNTER — Ambulatory Visit (INDEPENDENT_AMBULATORY_CARE_PROVIDER_SITE_OTHER): Payer: Medicare Other | Admitting: Internal Medicine

## 2016-10-22 ENCOUNTER — Encounter: Payer: Self-pay | Admitting: Internal Medicine

## 2016-10-22 VITALS — BP 122/66 | HR 80 | Temp 97.6°F | Resp 16 | Ht 68.0 in | Wt 223.6 lb

## 2016-10-22 DIAGNOSIS — E782 Mixed hyperlipidemia: Secondary | ICD-10-CM

## 2016-10-22 DIAGNOSIS — E1169 Type 2 diabetes mellitus with other specified complication: Secondary | ICD-10-CM

## 2016-10-22 DIAGNOSIS — E669 Obesity, unspecified: Secondary | ICD-10-CM

## 2016-10-22 DIAGNOSIS — G8929 Other chronic pain: Secondary | ICD-10-CM | POA: Diagnosis not present

## 2016-10-22 DIAGNOSIS — E1142 Type 2 diabetes mellitus with diabetic polyneuropathy: Secondary | ICD-10-CM | POA: Diagnosis not present

## 2016-10-22 DIAGNOSIS — M25562 Pain in left knee: Secondary | ICD-10-CM

## 2016-10-22 DIAGNOSIS — M25561 Pain in right knee: Secondary | ICD-10-CM

## 2016-10-22 DIAGNOSIS — I10 Essential (primary) hypertension: Secondary | ICD-10-CM | POA: Diagnosis not present

## 2016-10-22 DIAGNOSIS — Z23 Encounter for immunization: Secondary | ICD-10-CM | POA: Diagnosis not present

## 2016-10-22 DIAGNOSIS — G4733 Obstructive sleep apnea (adult) (pediatric): Secondary | ICD-10-CM

## 2016-10-22 LAB — COMPREHENSIVE METABOLIC PANEL
ALK PHOS: 49 U/L (ref 39–117)
ALT: 22 U/L (ref 0–53)
AST: 22 U/L (ref 0–37)
Albumin: 4.3 g/dL (ref 3.5–5.2)
BILIRUBIN TOTAL: 0.4 mg/dL (ref 0.2–1.2)
BUN: 18 mg/dL (ref 6–23)
CO2: 26 mEq/L (ref 19–32)
CREATININE: 0.93 mg/dL (ref 0.40–1.50)
Calcium: 10.3 mg/dL (ref 8.4–10.5)
Chloride: 102 mEq/L (ref 96–112)
GFR: 84.3 mL/min (ref >60.00)
GLUCOSE: 123 mg/dL — AB (ref 70–99)
Potassium: 4.7 mEq/L (ref 3.5–5.1)
Sodium: 136 mEq/L (ref 135–145)
TOTAL PROTEIN: 7.1 g/dL (ref 6.0–8.3)

## 2016-10-22 LAB — LIPID PANEL
CHOL/HDL RATIO: 3
CHOLESTEROL: 145 mg/dL (ref 0–200)
HDL: 45.4 mg/dL (ref >39.00)
LDL Cholesterol: 70 mg/dL (ref 0–99)
NONHDL: 99.66
Triglycerides: 150 mg/dL — ABNORMAL HIGH (ref 0.0–149.0)
VLDL: 30 mg/dL (ref 0.0–40.0)

## 2016-10-22 LAB — MICROALBUMIN / CREATININE URINE RATIO
Creatinine,U: 184.2 mg/dL
MICROALB/CREAT RATIO: 0.9 mg/g (ref 0.0–30.0)
Microalb, Ur: 1.7 mg/dL (ref 0.0–1.9)

## 2016-10-22 LAB — HEMOGLOBIN A1C: HEMOGLOBIN A1C: 6.9 % — AB (ref 4.6–6.5)

## 2016-10-22 NOTE — Patient Instructions (Addendum)
Referral to Dr Marlou Sa to use Aleve twice  daily  Plus up to 2000 mg tylenol in divided doses for your  knee pain   Tell [name] to schedule a nurse visit for her flu shot

## 2016-10-22 NOTE — Progress Notes (Signed)
Subjective:  Patient ID: [name] [name], male    DOB: [birth year]  Age: 76 y.o. MRN: [id number]  CC: The primary encounter diagnosis was Type 2 DM with diabetic neuropathy affecting both sides of body ([place]). Diagnoses of Encounter for immunization, Mixed hyperlipidemia due to type 2 diabetes mellitus ([place]), Chronic pain of both knees, Essential hypertension, Obesity (BMI 30.0-34.9), and OSA (obstructive sleep apnea) were also pertinent to this visit.  HPI [name] presents for  6 month follow up on diabetes.  Patient has no complaints today.  Patient is following a low glycemic index diet and taking all prescribed medications regularly without side effects.  Not checking blood sugars. s. Patient is exercising about 3 times per week and intentionally trying to lose weight .  Patient has had an eye exam in the last 12 months and checks feet regularly for signs of infection.  Patient does not walk barefoot outside,  Because he has known diabetic neuroapthy involving both feet. Patient is up to date on all recommended vaccinations  Lab Results  Component Value Date   HGBA1C 6.9 (H) 10/22/2016   Lab Results  Component Value Date   PSA 1.78 04/21/2016   PSA 2.39 03/14/2015   PSA 1.23 02/27/2014   Has gained 9 lbs siince dec 2017  Lab Results  Component Value Date   MICROALBUR 1.7 10/22/2016     Bilateral knee pIN  WANTS TO SEE hOOTEN   Outpatient Medications Prior to Visit  Medication Sig Dispense Refill  . amLODipine-benazepril (LOTREL) 10-40 MG capsule TAKE ONE CAPSULE DAILY 90 capsule 1  . aspirin 81 MG tablet Take 81 mg by mouth daily.      . Choline Fenofibrate (FENOFIBRIC ACID) 135 MG CPDR TAKE ONE CAPSULE BY MOUTH ONCE DAILY 90 capsule 1  . co-enzyme Q-10 30 MG capsule Take 30 mg by mouth daily.      . fish oil-omega-3 fatty acids 1000 MG capsule Take 2 g by mouth daily.      Marland Kitchen gabapentin (NEURONTIN) 300 MG capsule Take 1 capsule (300 mg total) by mouth at bedtime. 90  capsule 2  . glipiZIDE (GLUCOTROL) 5 MG tablet TAKE 1/2 TABLET TWICE A DAY WITH MEALS 90 tablet 1  . metFORMIN (GLUCOPHAGE) 1000 MG tablet TAKE 1 TABLET BY MOUTH TWICE DAILY WITH MEALS 180 tablet 1  . Multiple Vitamin (MULTIVITAMIN) tablet Take 1 tablet by mouth daily.      . rosuvastatin (CRESTOR) 40 MG tablet TAKE 1 TABLET BY MOUTH USUALLY IN THE EVENING 30 tablet 5  . rosuvastatin (CRESTOR) 40 MG tablet TAKE 1 TABLET BY MOUTH USUALLY IN THE EVENING 30 tablet 3   No facility-administered medications prior to visit.     Review of Systems;  Patient denies headache, fevers, malaise, unintentional weight loss, skin rash, eye pain, sinus congestion and sinus pain, sore throat, dysphagia,  hemoptysis , cough, dyspnea, wheezing, chest pain, palpitations, orthopnea, edema, abdominal pain, nausea, melena, diarrhea, constipation, flank pain, dysuria, hematuria, urinary  Frequency, nocturia, numbness, tingling, seizures,  Focal weakness, Loss of consciousness,  Tremor, insomnia, depression, anxiety, and suicidal ideation.      Objective:  BP 122/66 (BP Location: Left Arm, Patient Position: Sitting, Cuff Size: Normal)   Pulse 80   Temp 97.6 F (36.4 C) (Oral)   Resp 16   Ht 5\' 8"  (1.727 m)   Wt 223 lb 9.6 oz (101.4 kg)   SpO2 94%   BMI 34.00 kg/m   BP Readings from  Last 3 Encounters:  10/22/16 122/66  06/09/16 136/70  04/21/16 116/72    Wt Readings from Last 3 Encounters:  10/22/16 223 lb 9.6 oz (101.4 kg)  06/09/16 220 lb 12.8 oz (100.2 kg)  04/21/16 219 lb 3.2 oz (99.4 kg)    General appearance: alert, cooperative and appears stated age Ears: normal TM's and external ear canals both ears Throat: lips, mucosa, and tongue normal; teeth and gums normal Neck: no adenopathy, no carotid bruit, supple, symmetrical, trachea midline and thyroid not enlarged, symmetric, no tenderness/mass/nodules Back: symmetric, no curvature. ROM normal. No CVA tenderness. Lungs: clear to auscultation  bilaterally Heart: regular rate and rhythm, S1, S2 normal, no murmur, click, rub or gallop Abdomen: soft, non-tender; bowel sounds normal; no masses,  no organomegaly Pulses: 2+ and symmetric Skin: Skin color, texture, turgor normal. No rashes or lesions Lymph nodes: Cervical, supraclavicular, and axillary nodes normal.  Lab Results  Component Value Date   HGBA1C 6.9 (H) 10/22/2016   HGBA1C 6.4 04/22/2016   HGBA1C 6.3 09/17/2015    Lab Results  Component Value Date   CREATININE 0.93 10/22/2016   CREATININE 0.99 04/21/2016   CREATININE 1.05 09/17/2015    Lab Results  Component Value Date   GLUCOSE 123 (H) 10/22/2016   CHOL 145 10/22/2016   TRIG 150.0 (H) 10/22/2016   HDL 45.40 10/22/2016   LDLDIRECT 86.0 06/10/2015   LDLCALC 70 10/22/2016   ALT 22 10/22/2016   AST 22 10/22/2016   NA 136 10/22/2016   K 4.7 10/22/2016   CL 102 10/22/2016   CREATININE 0.93 10/22/2016   BUN 18 10/22/2016   CO2 26 10/22/2016   TSH 0.48 09/17/2015   PSA 1.78 04/21/2016   HGBA1C 6.9 (H) 10/22/2016   MICROALBUR 1.7 10/22/2016    No results found.  Assessment & Plan:   Problem List Items Addressed This Visit    Hypertension    Well controlled on current regimen. Renal function stable, no changes today.  Lab Results  Component Value Date   CREATININE 0.93 10/22/2016   Lab Results  Component Value Date   NA 136 10/22/2016   K 4.7 10/22/2016   CL 102 10/22/2016   CO2 26 10/22/2016         Mixed hyperlipidemia due to type 2 diabetes mellitus ([facility])    LDL and triglycerides are controlled with rosuvastatin and fenofibrate, and he is taking a baby aspirin daily.  He has no side effects and liver enzymes are normal. No changes today   Lab Results  Component Value Date   CHOL 145 10/22/2016   HDL 45.40 10/22/2016   LDLCALC 70 10/22/2016   LDLDIRECT 86.0 06/10/2015   TRIG 150.0 (H) 10/22/2016   CHOLHDL 3 10/22/2016   Lab Results  Component Value Date   ALT 22 10/22/2016    AST 22 10/22/2016   ALKPHOS 49 10/22/2016   BILITOT 0.4 10/22/2016         Relevant Orders   Lipid panel (Completed)   Obesity (BMI 30.0-34.9)    Body mass index is 34 kg/m. I have addressed  His BMI and recommended a low glycemic index diet utilizing smaller more frequent meals to increase metabolism.  I have also recommended that patient start exercising with a goal of 30 minutes of aerobic exercise a minimum of 5 days per week.       OSA (obstructive sleep apnea)    Diagnosed by prior sleep study. Patient is using CPAP every night a minimum of  6 hours per night and notes improved daytime wakefulness and decreased fatigue       Type 2 DM with diabetic neuropathy affecting both sides of body ([place]) - Primary     well-controlled on current medications.  hemoglobin A1c is less than 7.0 . Patient is up-to-date on eye exams  Patient has no proteinuria , Is taking a baby aspirin,  tolerating statin therapy for CAD risk reduction and an ACE/ARB .  Lab Results  Component Value Date   HGBA1C 6.9 (H) 10/22/2016   Lab Results  Component Value Date   MICROALBUR 1.7 10/22/2016         Relevant Orders   Hemoglobin A1c (Completed)   Comprehensive metabolic panel (Completed)   Microalbumin / creatinine urine ratio (Completed)    Other Visit Diagnoses    Encounter for immunization       Relevant Orders   Flu vaccine HIGH DOSE PF (Completed)   Chronic pain of both knees       Relevant Orders   Ambulatory referral to Orthopedic Surgery    A total of 25 minutes of face to face time was spent with patient more than half of which was spent in counselling about the above mentioned conditions  and coordination of care   I am having Mr. Granquist maintain his aspirin, co-enzyme Q-10, fish oil-omega-3 fatty acids, multivitamin, rosuvastatin, gabapentin, metFORMIN, rosuvastatin, glipiZIDE, amLODipine-benazepril, and Fenofibric Acid.  No orders of the defined types were placed in this  encounter.   There are no discontinued medications.  Follow-up: Return in about 6 months (around 04/22/2017).   Crecencio Mc, MD

## 2016-10-24 ENCOUNTER — Encounter: Payer: Self-pay | Admitting: Internal Medicine

## 2016-10-24 NOTE — Assessment & Plan Note (Signed)
Well controlled on current regimen. Renal function stable, no changes today.  Lab Results  Component Value Date   CREATININE 0.93 10/22/2016   Lab Results  Component Value Date   NA 136 10/22/2016   K 4.7 10/22/2016   CL 102 10/22/2016   CO2 26 10/22/2016

## 2016-10-24 NOTE — Assessment & Plan Note (Signed)
LDL and triglycerides are controlled with rosuvastatin and fenofibrate, and he is taking a baby aspirin daily.  He has no side effects and liver enzymes are normal. No changes today   Lab Results  Component Value Date   CHOL 145 10/22/2016   HDL 45.40 10/22/2016   LDLCALC 70 10/22/2016   LDLDIRECT 86.0 06/10/2015   TRIG 150.0 (H) 10/22/2016   CHOLHDL 3 10/22/2016   Lab Results  Component Value Date   ALT 22 10/22/2016   AST 22 10/22/2016   ALKPHOS 49 10/22/2016   BILITOT 0.4 10/22/2016

## 2016-10-24 NOTE — Assessment & Plan Note (Signed)
Diagnosed by prior sleep study. Patient is using CPAP every night a minimum of 6 hours per night and notes improved daytime wakefulness and decreased fatigue  

## 2016-10-24 NOTE — Assessment & Plan Note (Signed)
Body mass index is 34 kg/m. I have addressed  His BMI and recommended a low glycemic index diet utilizing smaller more frequent meals to increase metabolism.  I have also recommended that patient start exercising with a goal of 30 minutes of aerobic exercise a minimum of 5 days per week.

## 2016-10-24 NOTE — Assessment & Plan Note (Signed)
well-controlled on current medications.  hemoglobin A1c is less than 7.0 . Patient is up-to-date on eye exams  Patient has no proteinuria , Is taking a baby aspirin,  tolerating statin therapy for CAD risk reduction and an ACE/ARB .  Lab Results  Component Value Date   HGBA1C 6.9 (H) 10/22/2016   Lab Results  Component Value Date   MICROALBUR 1.7 10/22/2016

## 2016-11-08 ENCOUNTER — Other Ambulatory Visit: Payer: Self-pay | Admitting: Internal Medicine

## 2016-11-08 NOTE — Telephone Encounter (Signed)
Mailed unread message to patient. thanks 

## 2016-11-15 ENCOUNTER — Other Ambulatory Visit: Payer: Self-pay | Admitting: Internal Medicine

## 2016-12-11 DIAGNOSIS — M21371 Foot drop, right foot: Secondary | ICD-10-CM | POA: Insufficient documentation

## 2016-12-24 ENCOUNTER — Other Ambulatory Visit: Payer: Self-pay | Admitting: Internal Medicine

## 2017-01-24 ENCOUNTER — Encounter: Payer: Self-pay | Admitting: Internal Medicine

## 2017-01-24 ENCOUNTER — Ambulatory Visit: Payer: Medicare Other | Admitting: Internal Medicine

## 2017-01-24 VITALS — BP 130/70 | HR 96 | Temp 97.5°F | Resp 16 | Ht 68.0 in | Wt 217.8 lb

## 2017-01-24 DIAGNOSIS — E875 Hyperkalemia: Secondary | ICD-10-CM

## 2017-01-24 DIAGNOSIS — I10 Essential (primary) hypertension: Secondary | ICD-10-CM

## 2017-01-24 DIAGNOSIS — E782 Mixed hyperlipidemia: Secondary | ICD-10-CM | POA: Diagnosis not present

## 2017-01-24 DIAGNOSIS — E538 Deficiency of other specified B group vitamins: Secondary | ICD-10-CM | POA: Diagnosis not present

## 2017-01-24 DIAGNOSIS — E669 Obesity, unspecified: Secondary | ICD-10-CM | POA: Diagnosis not present

## 2017-01-24 DIAGNOSIS — R634 Abnormal weight loss: Secondary | ICD-10-CM | POA: Diagnosis not present

## 2017-01-24 DIAGNOSIS — E1142 Type 2 diabetes mellitus with diabetic polyneuropathy: Secondary | ICD-10-CM

## 2017-01-24 DIAGNOSIS — E1169 Type 2 diabetes mellitus with other specified complication: Secondary | ICD-10-CM | POA: Diagnosis not present

## 2017-01-24 LAB — TSH: TSH: 0.69 u[IU]/mL (ref 0.35–4.50)

## 2017-01-24 LAB — COMPREHENSIVE METABOLIC PANEL
ALT: 22 U/L (ref 0–53)
AST: 23 U/L (ref 0–37)
Albumin: 4.3 g/dL (ref 3.5–5.2)
Alkaline Phosphatase: 48 U/L (ref 39–117)
BUN: 14 mg/dL (ref 6–23)
CHLORIDE: 103 meq/L (ref 96–112)
CO2: 26 mEq/L (ref 19–32)
CREATININE: 1.06 mg/dL (ref 0.40–1.50)
Calcium: 10.4 mg/dL (ref 8.4–10.5)
GFR: 72.44 mL/min (ref >60.00)
GLUCOSE: 164 mg/dL — AB (ref 70–99)
POTASSIUM: 5.3 meq/L — AB (ref 3.5–5.1)
SODIUM: 138 meq/L (ref 135–145)
Total Bilirubin: 0.4 mg/dL (ref 0.2–1.2)
Total Protein: 7.2 g/dL (ref 6.0–8.3)

## 2017-01-24 LAB — LIPID PANEL
CHOL/HDL RATIO: 4
Cholesterol: 140 mg/dL (ref 0–200)
HDL: 37.2 mg/dL — AB (ref >39.00)
LDL CALC: 63 mg/dL (ref 0–99)
NonHDL: 102.52
Triglycerides: 196 mg/dL — ABNORMAL HIGH (ref 0.0–149.0)
VLDL: 39.2 mg/dL (ref 0.0–40.0)

## 2017-01-24 LAB — VITAMIN B12: VITAMIN B 12: 233 pg/mL (ref 211–911)

## 2017-01-24 LAB — HEMOGLOBIN A1C: Hgb A1c MFr Bld: 7.3 % — ABNORMAL HIGH (ref 4.6–6.5)

## 2017-01-24 MED ORDER — ZOSTER VAC RECOMB ADJUVANTED 50 MCG/0.5ML IM SUSR: 0.5000 mL | Freq: Once | INTRAMUSCULAR | 1 refills | Status: AC

## 2017-01-24 NOTE — Patient Instructions (Addendum)
You have lost 6 lbs since your last visit!  Congrats!  You are correct :the gabapentin may contribute to dizziness..  If you want to stop it:  Take the gabapentin  every other night for one week,  Then stop .  If the neuropathy in your legs causes more pain without the gabapentin,  and if taking the gabapentin did not make you dizzy.  You can resume it     The ShingRx vaccine is now available in local pharmacies and is much more protective thant Zostavaxs,  It is therefore ADVISED for all interested adults over 50 to prevent shingles

## 2017-01-24 NOTE — Progress Notes (Signed)
Subjective:  Patient ID: [name] [name], male    DOB: May 08, 1940  Age: 77 y.o. MRN: [id number]  CC: The primary encounter diagnosis was Weight loss. Diagnoses of Mixed hyperlipidemia due to type 2 diabetes mellitus ([place]), Type 2 DM with diabetic neuropathy affecting both sides of body ([place]), B12 deficiency, Essential hypertension, Obesity (BMI 30.0-34.9), and Hyperkalemia were also pertinent to this visit.  HPI [name] [name] presents for 3 month follow up on diabetes.  Patient has no complaints today.  Patient is following a low glycemic index diet and taking all prescribed medications regularly without side effects.  Fasting sugars have been under less than 140 most of the time and post prandials have been under 160 except on rare occasions. Patient is exercising about 3 times per week and intentionally trying to lose weight .  Patient has had an eye exam in the last 12 months and checks feet regularly for signs of infection.  Patient does not walk barefoot outside.   He has a  known sensorimotor  neuropathy that was recently confirmed via Neurology evaluation with  EMG/[place] studies done by Dr Manuella Ghazi (after being referred to [facility] for right leg weakness that resulted in a fall.  Knee films were normal ).  Has given up alcohol at Dr.  Trena Platt advices but has not had an office visit yet with Dr. Manuella Ghazi .    He has been using a cane since his fall because he does not trust his balance. He is hesitant  to wear orthotics to manage the foot drop, but admits that it will depend on the shoe.  Lab Results  Component Value Date   HGBA1C 7.3 (H) 01/24/2017   Lab Results  Component Value Date   MICROALBUR 1.7 10/22/2016   Lab Results  Component Value Date   CHOL 140 01/24/2017   HDL 37.20 (L) 01/24/2017   LDLCALC 63 01/24/2017   LDLDIRECT 86.0 06/10/2015   TRIG 196.0 (H) 01/24/2017   CHOLHDL 4 01/24/2017     Outpatient Medications Prior to Visit  Medication Sig Dispense Refill  .  amLODipine-benazepril (LOTREL) 10-40 MG capsule TAKE ONE CAPSULE DAILY 90 capsule 1  . aspirin 81 MG tablet Take 81 mg by mouth daily.      . Choline Fenofibrate (FENOFIBRIC ACID) 135 MG CPDR TAKE ONE CAPSULE BY MOUTH ONCE DAILY 90 capsule 1  . co-enzyme Q-10 30 MG capsule Take 30 mg by mouth daily.      . fish oil-omega-3 fatty acids 1000 MG capsule Take 2 g by mouth daily.      Marland Kitchen gabapentin (NEURONTIN) 300 MG capsule TAKE ONE CAPSULE AT BEDTIME 90 capsule 1  . glipiZIDE (GLUCOTROL) 5 MG tablet TAKE 1/2 TABLET TWICE A DAY WITH MEALS 90 tablet 1  . metFORMIN (GLUCOPHAGE) 1000 MG tablet TAKE 1 TABLET BY MOUTH TWO TIMES DAILY WITH MEALS 180 tablet 1  . Multiple Vitamin (MULTIVITAMIN) tablet Take 1 tablet by mouth daily.      . rosuvastatin (CRESTOR) 40 MG tablet TAKE 1 TABLET BY MOUTH USUALLY IN THE EVENING 30 tablet 5  . rosuvastatin (CRESTOR) 40 MG tablet TAKE ONE TABLET BY MOUTH IN THE EVENING 30 tablet 2   No facility-administered medications prior to visit.      Review of Systems;  Patient denies headache, fevers, malaise, unintentional weight loss, skin rash, eye pain, sinus congestion and sinus pain, sore throat, dysphagia,  hemoptysis , cough, dyspnea, wheezing, chest pain, palpitations, orthopnea, edema, abdominal pain, nausea,  melena, diarrhea, constipation, flank pain, dysuria, hematuria, urinary  Frequency, nocturia, numbness, tingling, seizures,  Focal weakness, Loss of consciousness,  Tremor, insomnia, depression, anxiety, and suicidal ideation.      Objective:  BP 130/70 (BP Location: Left Arm, Patient Position: Sitting, Cuff Size: Normal)   Pulse 96   Temp (!) 97.5 F (36.4 C) (Oral)   Resp 16   Ht 5\' 8"  (1.727 m)   Wt 217 lb 12.8 oz (98.8 kg)   SpO2 93%   BMI 33.12 kg/m   BP Readings from Last 3 Encounters:  01/24/17 130/70  10/22/16 122/66  06/09/16 136/70    Wt Readings from Last 3 Encounters:  01/24/17 217 lb 12.8 oz (98.8 kg)  10/22/16 223 lb 9.6 oz (101.4  kg)  06/09/16 220 lb 12.8 oz (100.2 kg)    General appearance: alert, cooperative and appears stated age Ears: normal TM's and external ear canals both ears Throat: lips, mucosa, and tongue normal; teeth and gums normal Neck: no adenopathy, no carotid bruit, supple, symmetrical, trachea midline and thyroid not enlarged, symmetric, no tenderness/mass/nodules Back: symmetric, no curvature. ROM normal. No CVA tenderness. Lungs: clear to auscultation bilaterally Heart: regular rate and rhythm, S1, S2 normal, no murmur, click, rub or gallop Abdomen: soft, non-tender; bowel sounds normal; no masses,  no organomegaly Pulses: 2+ and symmetric Skin: Skin color, texture, turgor normal. No rashes or lesions Lymph nodes: Cervical, supraclavicular, and axillary nodes normal.  Lab Results  Component Value Date   HGBA1C 7.3 (H) 01/24/2017   HGBA1C 6.9 (H) 10/22/2016   HGBA1C 6.4 04/22/2016    Lab Results  Component Value Date   CREATININE 1.06 01/24/2017   CREATININE 0.93 10/22/2016   CREATININE 0.99 04/21/2016    Lab Results  Component Value Date   GLUCOSE 164 (H) 01/24/2017   CHOL 140 01/24/2017   TRIG 196.0 (H) 01/24/2017   HDL 37.20 (L) 01/24/2017   LDLDIRECT 86.0 06/10/2015   LDLCALC 63 01/24/2017   ALT 22 01/24/2017   AST 23 01/24/2017   NA 138 01/24/2017   K 5.3 (H) 01/24/2017   CL 103 01/24/2017   CREATININE 1.06 01/24/2017   BUN 14 01/24/2017   CO2 26 01/24/2017   TSH 0.69 01/24/2017   PSA 1.78 04/21/2016   HGBA1C 7.3 (H) 01/24/2017   MICROALBUR 1.7 10/22/2016    No results found.  Assessment & Plan:   Problem List Items Addressed This Visit    B12 deficiency    Presumed due to metformin  Checking intrinsic factor.  Patient will return for weekly injections.       Relevant Orders   Vitamin B12 (Completed)   Intrinsic Factor Antibodies   Hyperkalemia    Mild,  On lisinopril.  Recheck one week       Relevant Orders   Basic metabolic panel   Hypertension      Well controlled on current regimen. Renal function stable, no changes today.  Lab Results  Component Value Date   CREATININE 1.06 01/24/2017   Lab Results  Component Value Date   NA 138 01/24/2017   K 5.3 (H) 01/24/2017   CL 103 01/24/2017   CO2 26 01/24/2017         Mixed hyperlipidemia due to type 2 diabetes mellitus ([facility])    LDL and triglycerides are controlled with rosuvastatin and fenofibrate, and he is taking a baby aspirin daily.  He has no side effects and liver enzymes are normal. No changes today   Lab  Results  Component Value Date   CHOL 140 01/24/2017   HDL 37.20 (L) 01/24/2017   LDLCALC 63 01/24/2017   LDLDIRECT 86.0 06/10/2015   TRIG 196.0 (H) 01/24/2017   CHOLHDL 4 01/24/2017   Lab Results  Component Value Date   ALT 22 01/24/2017   AST 23 01/24/2017   ALKPHOS 48 01/24/2017   BILITOT 0.4 01/24/2017         Relevant Orders   Lipid panel (Completed)   Obesity (BMI 30.0-34.9)    I have congratulated him in his weight loss and encouraged  Continued weight loss with goal of 10% of body weigh over the next 6 months using a low glycemic index diet and regular exercise a minimum of 5 days per week.        Type 2 DM with diabetic neuropathy affecting both sides of body ([place])     well-controlled on current medications.  hemoglobin A1c  Has increased slightly due to holiday indulgences.  . Patient is up-to-date on eye exams  Patient has no proteinuria , Is taking a baby aspirin,  tolerating statin therapy for CAD risk reduction and an ACE/ARB . His longstanding neuropathy is now under evaluation by Dr Manuella Ghazi.   Lab Results  Component Value Date   HGBA1C 7.3 (H) 01/24/2017   Lab Results  Component Value Date   MICROALBUR 1.7 10/22/2016         Relevant Orders   Hemoglobin A1c (Completed)   Comprehensive metabolic panel (Completed)    Other Visit Diagnoses    Weight loss    -  Primary   Relevant Orders   TSH (Completed)     A total of 25  minutes of face to face time was spent with patient more than half of which was spent in counselling about the above mentioned conditions  and coordination of care   I am having [name] [name] start on Zoster Vaccine Adjuvanted. I am also having him maintain his aspirin, co-enzyme Q-10, fish oil-omega-3 fatty acids, multivitamin, rosuvastatin, glipiZIDE, amLODipine-benazepril, Fenofibric Acid, rosuvastatin, gabapentin, and metFORMIN.  Meds ordered this encounter  Medications  . Zoster Vaccine Adjuvanted [facility]) injection    Sig: Inject 0.5 mLs into the muscle once for 1 dose.    Dispense:  1 each    Refill:  1    There are no discontinued medications.  Follow-up: Return in about 3 months (around 04/24/2017) for follow up diabetes.   Crecencio Mc, MD

## 2017-01-26 DIAGNOSIS — E538 Deficiency of other specified B group vitamins: Secondary | ICD-10-CM | POA: Insufficient documentation

## 2017-01-26 DIAGNOSIS — E875 Hyperkalemia: Secondary | ICD-10-CM | POA: Insufficient documentation

## 2017-01-26 NOTE — Assessment & Plan Note (Signed)
I have congratulated him in his weight loss and encouraged  Continued weight loss with goal of 10% of body weigh over the next 6 months using a low glycemic index diet and regular exercise a minimum of 5 days per week.   

## 2017-01-26 NOTE — Assessment & Plan Note (Signed)
LDL and triglycerides are controlled with rosuvastatin and fenofibrate, and he is taking a baby aspirin daily.  He has no side effects and liver enzymes are normal. No changes today   Lab Results  Component Value Date   CHOL 140 01/24/2017   HDL 37.20 (L) 01/24/2017   LDLCALC 63 01/24/2017   LDLDIRECT 86.0 06/10/2015   TRIG 196.0 (H) 01/24/2017   CHOLHDL 4 01/24/2017   Lab Results  Component Value Date   ALT 22 01/24/2017   AST 23 01/24/2017   ALKPHOS 48 01/24/2017   BILITOT 0.4 01/24/2017

## 2017-01-26 NOTE — Assessment & Plan Note (Signed)
Mild,  On lisinopril.  Recheck one week

## 2017-01-26 NOTE — Assessment & Plan Note (Signed)
Presumed due to metformin  Checking intrinsic factor.  Patient will return for weekly injections.

## 2017-01-26 NOTE — Assessment & Plan Note (Signed)
well-controlled on current medications.  hemoglobin A1c  Has increased slightly due to holiday indulgences.  . Patient is up-to-date on eye exams  Patient has no proteinuria , Is taking a baby aspirin,  tolerating statin therapy for CAD risk reduction and an ACE/ARB . His longstanding neuropathy is now under evaluation by Dr Manuella Ghazi.   Lab Results  Component Value Date   HGBA1C 7.3 (H) 01/24/2017   Lab Results  Component Value Date   MICROALBUR 1.7 10/22/2016

## 2017-01-26 NOTE — Assessment & Plan Note (Signed)
Well controlled on current regimen. Renal function stable, no changes today.  Lab Results  Component Value Date   CREATININE 1.06 01/24/2017   Lab Results  Component Value Date   NA 138 01/24/2017   K 5.3 (H) 01/24/2017   CL 103 01/24/2017   CO2 26 01/24/2017

## 2017-02-01 ENCOUNTER — Ambulatory Visit (INDEPENDENT_AMBULATORY_CARE_PROVIDER_SITE_OTHER): Payer: Medicare Other

## 2017-02-01 DIAGNOSIS — E538 Deficiency of other specified B group vitamins: Secondary | ICD-10-CM | POA: Diagnosis not present

## 2017-02-01 DIAGNOSIS — E875 Hyperkalemia: Secondary | ICD-10-CM

## 2017-02-01 LAB — BASIC METABOLIC PANEL
BUN: 17 mg/dL (ref 6–23)
CALCIUM: 9.9 mg/dL (ref 8.4–10.5)
CO2: 26 mEq/L (ref 19–32)
Chloride: 105 mEq/L (ref 96–112)
Creatinine, Ser: 1.03 mg/dL (ref 0.40–1.50)
GFR: 74.87 mL/min (ref >60.00)
Glucose, Bld: 155 mg/dL — ABNORMAL HIGH (ref 70–99)
Potassium: 4.3 mEq/L (ref 3.5–5.1)
Sodium: 138 mEq/L (ref 135–145)

## 2017-02-01 MED ORDER — CYANOCOBALAMIN 1000 MCG/ML IJ SOLN
1000.0000 ug | Freq: Once | INTRAMUSCULAR | Status: AC
  Administered 2017-02-01: 1000 ug via INTRAMUSCULAR

## 2017-02-01 NOTE — Progress Notes (Addendum)
Pt presented today for B12 injection. Left deltoid. Pt showed no distress nor voiced any concerns during the injection.   Reviewed.  Dr Nicki Reaper

## 2017-02-02 ENCOUNTER — Encounter: Payer: Self-pay | Admitting: Internal Medicine

## 2017-02-02 LAB — HM DIABETES EYE EXAM

## 2017-02-05 LAB — INTRINSIC FACTOR ANTIBODIES: Intrinsic Factor: POSITIVE — AB

## 2017-02-06 ENCOUNTER — Encounter: Payer: Self-pay | Admitting: Internal Medicine

## 2017-02-08 ENCOUNTER — Ambulatory Visit (INDEPENDENT_AMBULATORY_CARE_PROVIDER_SITE_OTHER): Payer: Medicare Other

## 2017-02-08 DIAGNOSIS — E538 Deficiency of other specified B group vitamins: Secondary | ICD-10-CM

## 2017-02-08 MED ORDER — CYANOCOBALAMIN 1000 MCG/ML IJ SOLN
1000.0000 ug | Freq: Once | INTRAMUSCULAR | Status: AC
  Administered 2017-02-08: 1000 ug via INTRAMUSCULAR

## 2017-02-08 NOTE — Progress Notes (Addendum)
Pt presents today for B12 injection. Right deltoid. Pt voiced no concerns nor showed any distress during injection.   Reviewed.  Dr Nicki Reaper

## 2017-02-15 ENCOUNTER — Ambulatory Visit (INDEPENDENT_AMBULATORY_CARE_PROVIDER_SITE_OTHER): Payer: Medicare Other | Admitting: *Deleted

## 2017-02-15 DIAGNOSIS — E538 Deficiency of other specified B group vitamins: Secondary | ICD-10-CM | POA: Diagnosis not present

## 2017-02-15 MED ORDER — CYANOCOBALAMIN 1000 MCG/ML IJ SOLN
1000.0000 ug | Freq: Once | INTRAMUSCULAR | Status: AC
  Administered 2017-02-15: 1000 ug via INTRAMUSCULAR

## 2017-02-15 MED ORDER — GLIPIZIDE 5 MG PO TABS: ORAL_TABLET | ORAL | 1 refills | Status: DC

## 2017-02-15 MED ORDER — ROSUVASTATIN CALCIUM 40 MG PO TABS: 40.0000 mg | ORAL_TABLET | Freq: Every evening | ORAL | 2 refills | Status: DC

## 2017-02-15 NOTE — Progress Notes (Addendum)
Patient presented for B 12 injection to left deltoid, patient voiced no concerns nor showed any signs of distress during injection.  Reviewed.  Dr Scott 

## 2017-02-17 ENCOUNTER — Encounter: Payer: Self-pay | Admitting: Internal Medicine

## 2017-03-17 ENCOUNTER — Ambulatory Visit (INDEPENDENT_AMBULATORY_CARE_PROVIDER_SITE_OTHER): Payer: Medicare Other

## 2017-03-17 DIAGNOSIS — E538 Deficiency of other specified B group vitamins: Secondary | ICD-10-CM

## 2017-03-17 MED ORDER — CYANOCOBALAMIN 1000 MCG/ML IJ SOLN
1000.0000 ug | Freq: Once | INTRAMUSCULAR | Status: AC
  Administered 2017-03-17: 1000 ug via INTRAMUSCULAR

## 2017-03-17 MED ORDER — "SYRINGE 25G X 1"" 3 ML MISC": 1.0000 "application " | 0 refills | Status: DC

## 2017-03-17 MED ORDER — CYANOCOBALAMIN 1000 MCG/ML IJ SOLN: 1000.0000 ug | INTRAMUSCULAR | 5 refills | Status: DC

## 2017-03-17 MED ORDER — "NEEDLE (DISP) 25G X 1"" MISC": 1.0000 "application " | 0 refills | Status: AC

## 2017-03-17 NOTE — Progress Notes (Addendum)
Patient presented for B12 injection and teaching so he can start giving himself the injections at home. Left thigh. Patient voiced no concerns nor showed any distress during injection. Pt did very well giving himself the injection today.   Reviewed.  Per note, pt instructed on technique to give himself B12 injections.  To start at home injections.    Dr Nicki Reaper

## 2017-03-22 ENCOUNTER — Other Ambulatory Visit: Payer: Self-pay | Admitting: Internal Medicine

## 2017-03-23 ENCOUNTER — Other Ambulatory Visit: Payer: Self-pay | Admitting: *Deleted

## 2017-03-23 MED ORDER — AMLODIPINE BESY-BENAZEPRIL HCL 10-40 MG PO CAPS: 1.0000 | ORAL_CAPSULE | Freq: Every day | ORAL | 1 refills | Status: DC

## 2017-04-12 ENCOUNTER — Other Ambulatory Visit: Payer: Self-pay | Admitting: Internal Medicine

## 2017-04-22 ENCOUNTER — Ambulatory Visit: Payer: Medicare Other | Admitting: Internal Medicine

## 2017-04-25 ENCOUNTER — Encounter: Payer: Self-pay | Admitting: Internal Medicine

## 2017-04-25 ENCOUNTER — Ambulatory Visit: Payer: Medicare Other | Admitting: Internal Medicine

## 2017-04-25 VITALS — BP 116/70 | HR 83 | Temp 97.6°F | Resp 15 | Ht 68.0 in | Wt 211.6 lb

## 2017-04-25 DIAGNOSIS — Z83719 Family history of colon polyps, unspecified: Secondary | ICD-10-CM

## 2017-04-25 DIAGNOSIS — E538 Deficiency of other specified B group vitamins: Secondary | ICD-10-CM | POA: Diagnosis not present

## 2017-04-25 DIAGNOSIS — E875 Hyperkalemia: Secondary | ICD-10-CM

## 2017-04-25 DIAGNOSIS — R634 Abnormal weight loss: Secondary | ICD-10-CM

## 2017-04-25 DIAGNOSIS — E1169 Type 2 diabetes mellitus with other specified complication: Secondary | ICD-10-CM | POA: Diagnosis not present

## 2017-04-25 DIAGNOSIS — Z8371 Family history of colonic polyps: Secondary | ICD-10-CM | POA: Diagnosis not present

## 2017-04-25 DIAGNOSIS — E1142 Type 2 diabetes mellitus with diabetic polyneuropathy: Secondary | ICD-10-CM

## 2017-04-25 DIAGNOSIS — E782 Mixed hyperlipidemia: Secondary | ICD-10-CM | POA: Diagnosis not present

## 2017-04-25 LAB — COMPREHENSIVE METABOLIC PANEL
ALBUMIN: 4.1 g/dL (ref 3.5–5.2)
ALK PHOS: 48 U/L (ref 39–117)
ALT: 20 U/L (ref 0–53)
AST: 20 U/L (ref 0–37)
BILIRUBIN TOTAL: 0.3 mg/dL (ref 0.2–1.2)
BUN: 16 mg/dL (ref 6–23)
CO2: 25 mEq/L (ref 19–32)
CREATININE: 1.05 mg/dL (ref 0.40–1.50)
Calcium: 9.8 mg/dL (ref 8.4–10.5)
Chloride: 105 mEq/L (ref 96–112)
GFR: 73.19 mL/min (ref >60.00)
GLUCOSE: 161 mg/dL — AB (ref 70–99)
POTASSIUM: 5 meq/L (ref 3.5–5.1)
SODIUM: 138 meq/L (ref 135–145)
TOTAL PROTEIN: 7.1 g/dL (ref 6.0–8.3)

## 2017-04-25 LAB — LIPID PANEL
Cholesterol: 135 mg/dL (ref 0–200)
HDL: 37.2 mg/dL — ABNORMAL LOW (ref >39.00)
NonHDL: 98.19
Total CHOL/HDL Ratio: 4
Triglycerides: 206 mg/dL — ABNORMAL HIGH (ref 0.0–149.0)
VLDL: 41.2 mg/dL — ABNORMAL HIGH (ref 0.0–40.0)

## 2017-04-25 LAB — HEMOGLOBIN A1C: Hgb A1c MFr Bld: 6.9 % — ABNORMAL HIGH (ref 4.6–6.5)

## 2017-04-25 LAB — MICROALBUMIN / CREATININE URINE RATIO
Creatinine,U: 123.4 mg/dL
MICROALB/CREAT RATIO: 1.2 mg/g (ref 0.0–30.0)
Microalb, Ur: 1.4 mg/dL (ref 0.0–1.9)

## 2017-04-25 LAB — LDL CHOLESTEROL, DIRECT: Direct LDL: 71 mg/dL

## 2017-04-25 MED ORDER — GABAPENTIN 300 MG PO CAPS: 300.0000 mg | ORAL_CAPSULE | Freq: Every day | ORAL | 1 refills | Status: DC

## 2017-04-25 MED ORDER — FENOFIBRIC ACID 135 MG PO CPDR
1.0000 | DELAYED_RELEASE_CAPSULE | Freq: Every day | ORAL | 1 refills | Status: DC
Start: 2017-04-25 — End: 2017-07-29

## 2017-04-25 MED ORDER — CYANOCOBALAMIN 1000 MCG/ML IJ SOLN: 1000.0000 ug | INTRAMUSCULAR | 1 refills | Status: DC

## 2017-04-25 NOTE — Assessment & Plan Note (Signed)
Resolved,  On lisinopril.    Lab Results  Component Value Date   NA 138 04/25/2017   K 5.0 04/25/2017   CL 105 04/25/2017   CO2 25 04/25/2017   Lab Results  Component Value Date   CREATININE 1.05 04/25/2017

## 2017-04-25 NOTE — Assessment & Plan Note (Signed)
His last colonoscopy was in 2014; 5 yr follow up is due in 2019. Referral made.

## 2017-04-25 NOTE — Progress Notes (Signed)
Subjective:  Patient ID: [name] [name], male    DOB: 1940/03/12  Age: 76 y.o. MRN: [id number]  CC: The primary encounter diagnosis was B12 deficiency. Diagnoses of Mixed hyperlipidemia due to type 2 diabetes mellitus ([place]), Type 2 DM with diabetic neuropathy affecting both sides of body ([place]), FH: colonic polyps, Hyperkalemia, and Weight loss, unintentional were also pertinent to this visit.  HPI [name] [name] presents for follow up on type 2 DM with neuropathy and hypertension   He has been losing weight  since October;  12  Lbs. The weight loss is welcomed,  But not intentional.  His  appetite is diminished bc :nothing tastes the same" .  He denies early satiety , abdominal pian,  Change in stools, fatigeu and night sweats. He has been having  occcasional low BS before bedtime or at bedtime  That  Make him fell "nervous" . he eats a teaspoon of peanut butter to mange it,   Denies sinus congestion .  Taking claritin   b12 deficiency:  He was noted to have a positive screen for  intrinsic factor antibody . He has ben self administering b12 injections .  Neuropathy: he has stopped taking the gabapentin since he noted no difference. He is wearing the leg braces and notes that he is more stable and able to walk without using a cane       Outpatient Medications Prior to Visit  Medication Sig Dispense Refill  . amLODipine-benazepril (LOTREL) 10-40 MG capsule Take 1 capsule by mouth daily. 90 capsule 1  . aspirin 81 MG tablet Take 81 mg by mouth daily.      Marland Kitchen co-enzyme Q-10 30 MG capsule Take 30 mg by mouth daily.      . fish oil-omega-3 fatty acids 1000 MG capsule Take 2 g by mouth daily.      Marland Kitchen glipiZIDE (GLUCOTROL) 5 MG tablet TAKE 1/2 TABLET TWICE A DAY WITH MEALS 90 tablet 1  . metFORMIN (GLUCOPHAGE) 1000 MG tablet TAKE ONE TABLET TWICE A DAY WITH MEALS 180 tablet 1  . Multiple Vitamin (MULTIVITAMIN) tablet Take 1 tablet by mouth daily.      Marland Kitchen NEEDLE, DISP, 25 G 25G X 1" MISC 1  application by Does not apply route every 30 (thirty) days. 6 each 0  . rosuvastatin (CRESTOR) 40 MG tablet TAKE ONE TABLET EVERY EVENING 90 tablet 1  . Syringe/Needle, Disp, (SYRINGE 3CC/25GX1") 25G X 1" 3 ML MISC 1 application by Does not apply route every 30 (thirty) days. For B12 injections 6 each 0  . Choline Fenofibrate (FENOFIBRIC ACID) 135 MG CPDR TAKE ONE CAPSULE BY MOUTH ONCE DAILY 90 capsule 1  . cyanocobalamin (,VITAMIN B-12,) 1000 MCG/ML injection Inject 1 mL (1,000 mcg total) into the muscle every 30 (thirty) days. 1 mL 5  . gabapentin (NEURONTIN) 300 MG capsule TAKE ONE CAPSULE AT BEDTIME 90 capsule 1  . rosuvastatin (CRESTOR) 40 MG tablet TAKE 1 TABLET BY MOUTH USUALLY IN THE EVENING (Patient not taking: Reported on 04/25/2017) 30 tablet 5   No facility-administered medications prior to visit.     Review of Systems;  Patient denies headache, fevers, malaise, unintentional weight loss, skin rash, eye pain, sinus congestion and sinus pain, sore throat, dysphagia,  hemoptysis , cough, dyspnea, wheezing, chest pain, palpitations, orthopnea, edema, abdominal pain, nausea, melena, diarrhea, constipation, flank pain, dysuria, hematuria, urinary  Frequency, nocturia, numbness, tingling, seizures,  Focal weakness, Loss of consciousness,  Tremor, insomnia, depression, anxiety, and suicidal  ideation.      Objective:  BP 116/70 (BP Location: Left Arm, Patient Position: Sitting, Cuff Size: Normal)   Pulse 83   Temp 97.6 F (36.4 C) (Oral)   Resp 15   Ht 5\' 8"  (1.727 m)   Wt 211 lb 9.6 oz (96 kg)   SpO2 93%   BMI 32.17 kg/m   BP Readings from Last 3 Encounters:  04/25/17 116/70  01/24/17 130/70  10/22/16 122/66    Wt Readings from Last 3 Encounters:  04/25/17 211 lb 9.6 oz (96 kg)  01/24/17 217 lb 12.8 oz (98.8 kg)  10/22/16 223 lb 9.6 oz (101.4 kg)    General appearance: alert, cooperative and appears stated age Ears: normal TM's and external ear canals both ears Throat:  lips, mucosa, and tongue normal; teeth and gums normal Neck: no adenopathy, no carotid bruit, supple, symmetrical, trachea midline and thyroid not enlarged, symmetric, no tenderness/mass/nodules Back: symmetric, no curvature. ROM normal. No CVA tenderness. Lungs: clear to auscultation bilaterally Heart: regular rate and rhythm, S1, S2 normal, no murmur, click, rub or gallop Abdomen: soft, non-tender; bowel sounds normal; no masses,  no organomegaly Pulses: 2+ and symmetric Skin: Skin color, texture, turgor normal. No rashes or lesions Lymph nodes: Cervical, supraclavicular, and axillary nodes normal.  Lab Results  Component Value Date   HGBA1C 6.9 (H) 04/25/2017   HGBA1C 7.3 (H) 01/24/2017   HGBA1C 6.9 (H) 10/22/2016    Lab Results  Component Value Date   CREATININE 1.05 04/25/2017   CREATININE 1.03 02/01/2017   CREATININE 1.06 01/24/2017    Lab Results  Component Value Date   GLUCOSE 161 (H) 04/25/2017   CHOL 135 04/25/2017   TRIG 206.0 (H) 04/25/2017   HDL 37.20 (L) 04/25/2017   LDLDIRECT 71.0 04/25/2017   LDLCALC 63 01/24/2017   ALT 20 04/25/2017   AST 20 04/25/2017   NA 138 04/25/2017   K 5.0 04/25/2017   CL 105 04/25/2017   CREATININE 1.05 04/25/2017   BUN 16 04/25/2017   CO2 25 04/25/2017   TSH 0.69 01/24/2017   PSA 1.78 04/21/2016   HGBA1C 6.9 (H) 04/25/2017   MICROALBUR 1.4 04/25/2017    No results found.  Assessment & Plan:   Problem List Items Addressed This Visit    B12 deficiency - Primary   Relevant Orders   RBC Folate   Weight loss, unintentional    Secondary to altered taste and decreased appetite.  He is up to date on colon ca screening,  Denies abdominal pain and has no B symptoms. The weight loss is welcomed given his BMI , but if it does not plateau he will need workup for occult malignancy      Type 2 DM with diabetic neuropathy affecting both sides of body ([place])     well-controlled on current medications.  hemoglobin A1c  Has increased  slightly due to holiday indulgences.  . Patient is up-to-date on eye exams  Patient has no proteinuria , Is taking a baby aspirin,  tolerating statin therapy for CAD risk reduction and an ACE/ARB . His longstanding neuropathy is now managed with leg braces to prevent foot drop .  He has d/c'd gabapentin .   Lab Results  Component Value Date   HGBA1C 6.9 (H) 04/25/2017   Lab Results  Component Value Date   MICROALBUR 1.4 04/25/2017         Relevant Orders   Hemoglobin A1c (Completed)   Microalbumin / creatinine urine ratio (Completed)  Comprehensive metabolic panel (Completed)   Mixed hyperlipidemia due to type 2 diabetes mellitus ([facility])    LDL and triglycerides are controlled with rosuvastatin and fenofibrate, and he is taking a baby aspirin daily.  He has no side effects and liver enzymes are normal. No changes today   Lab Results  Component Value Date   CHOL 135 04/25/2017   HDL 37.20 (L) 04/25/2017   LDLCALC 63 01/24/2017   LDLDIRECT 71.0 04/25/2017   TRIG 206.0 (H) 04/25/2017   CHOLHDL 4 04/25/2017   Lab Results  Component Value Date   ALT 20 04/25/2017   AST 20 04/25/2017   ALKPHOS 48 04/25/2017   BILITOT 0.3 04/25/2017         Relevant Medications   Choline Fenofibrate (FENOFIBRIC ACID) 135 MG CPDR   Other Relevant Orders   Lipid panel (Completed)   Hyperkalemia    Resolved,  On lisinopril.    Lab Results  Component Value Date   NA 138 04/25/2017   K 5.0 04/25/2017   CL 105 04/25/2017   CO2 25 04/25/2017   Lab Results  Component Value Date   CREATININE 1.05 04/25/2017         FH: colonic polyps    His last colonoscopy was in 2014; 5 yr follow up is due in 2019. Referral made.          I have discontinued Jeneen Rinks C. Bielinski's gabapentin and gabapentin. I have also changed his Fenofibric Acid. Additionally, I am having him maintain his aspirin, co-enzyme Q-10, fish oil-omega-3 fatty acids, multivitamin, glipiZIDE, SYRINGE 3CC/25GX1", NEEDLE (DISP)  25 G, metFORMIN, amLODipine-benazepril, rosuvastatin, and cyanocobalamin.  Meds ordered this encounter  Medications  . DISCONTD: gabapentin (NEURONTIN) 300 MG capsule    Sig: Take 1 capsule (300 mg total) by mouth at bedtime.    Dispense:  90 capsule    Refill:  1  . Choline Fenofibrate (FENOFIBRIC ACID) 135 MG CPDR    Sig: Take 1 capsule by mouth daily.    Dispense:  90 capsule    Refill:  1  . cyanocobalamin (,VITAMIN B-12,) 1000 MCG/ML injection    Sig: Inject 1 mL (1,000 mcg total) into the muscle every 30 (thirty) days.    Dispense:  10 mL    Refill:  1    Medications Discontinued During This Encounter  Medication Reason  . rosuvastatin (CRESTOR) 40 MG tablet Duplicate  . gabapentin (NEURONTIN) 300 MG capsule Reorder  . gabapentin (NEURONTIN) 300 MG capsule Patient has not taken in last 30 days  . Choline Fenofibrate (FENOFIBRIC ACID) 135 MG CPDR Reorder  . cyanocobalamin (,VITAMIN B-12,) 1000 MCG/ML injection    A total of 25 minutes of face to face time was spent with patient more than half of which was spent in counselling about the above mentioned conditions  and coordination of care   Follow-up: Return in about 4 months (around 08/25/2017) for follow up diabetes.   Crecencio Mc, MD

## 2017-04-25 NOTE — Assessment & Plan Note (Signed)
well-controlled on current medications.  hemoglobin A1c  Has increased slightly due to holiday indulgences.  . Patient is up-to-date on eye exams  Patient has no proteinuria , Is taking a baby aspirin,  tolerating statin therapy for CAD risk reduction and an ACE/ARB . His longstanding neuropathy is now managed with leg braces to prevent foot drop .  He has d/c'd gabapentin .   Lab Results  Component Value Date   HGBA1C 6.9 (H) 04/25/2017   Lab Results  Component Value Date   MICROALBUR 1.4 04/25/2017

## 2017-04-25 NOTE — Assessment & Plan Note (Signed)
Secondary to altered taste and decreased appetite.  He is up to date on colon ca screening,  Denies abdominal pain and has no B symptoms. The weight loss is welcomed given his BMI , but if it does not plateau he will need workup for occult malignancy

## 2017-04-25 NOTE — Assessment & Plan Note (Signed)
LDL and triglycerides are controlled with rosuvastatin and fenofibrate, and he is taking a baby aspirin daily.  He has no side effects and liver enzymes are normal. No changes today   Lab Results  Component Value Date   CHOL 135 04/25/2017   HDL 37.20 (L) 04/25/2017   LDLCALC 63 01/24/2017   LDLDIRECT 71.0 04/25/2017   TRIG 206.0 (H) 04/25/2017   CHOLHDL 4 04/25/2017   Lab Results  Component Value Date   ALT 20 04/25/2017   AST 20 04/25/2017   ALKPHOS 48 04/25/2017   BILITOT 0.3 04/25/2017

## 2017-04-26 LAB — FOLATE RBC: RBC Folate: 947 ng/mL RBC (ref >280)

## 2017-05-16 ENCOUNTER — Other Ambulatory Visit: Payer: Self-pay | Admitting: Internal Medicine

## 2017-06-10 ENCOUNTER — Ambulatory Visit (INDEPENDENT_AMBULATORY_CARE_PROVIDER_SITE_OTHER): Payer: Medicare Other

## 2017-06-10 VITALS — BP 114/70 | HR 86 | Temp 98.0°F | Resp 15 | Ht 68.0 in | Wt 206.8 lb

## 2017-06-10 DIAGNOSIS — Z0000 Encounter for general adult medical examination without abnormal findings: Secondary | ICD-10-CM | POA: Diagnosis not present

## 2017-06-10 NOTE — Patient Instructions (Addendum)
  [name] [name] , Thank you for taking time to come for your Medicare Wellness Visit. I appreciate your ongoing commitment to your health goals. Please review the following plan we discussed and let me know if I can assist you in the future.   These are the goals we discussed: Goals    . DIET - INCREASE WATER INTAKE       This is a list of the screening recommended for you and due dates:  Health Maintenance  Topic Date Due  . Flu Shot  08/18/2017  . Hemoglobin A1C  10/25/2017  . Complete foot exam   01/24/2018  . Eye exam for diabetics  02/02/2018  . Tetanus Vaccine  06/28/2020  . Colon Cancer Screening  04/29/2025  . Pneumonia vaccines  Completed

## 2017-06-10 NOTE — Progress Notes (Addendum)
Subjective:   [name] [name] is a 77 y.o. male who presents for Medicare Annual/Subsequent preventive examination.  Review of Systems:  No ROS.  Medicare Wellness Visit. Additional risk factors are reflected in the social history. Cardiac Risk Factors include: advanced age (>83men, >27 women);hypertension;male gender;diabetes mellitus     Objective:    Vitals: BP 114/70 (BP Location: Left Arm, Patient Position: Sitting, Cuff Size: Normal)   Pulse 86   Temp 98 F (36.7 C) (Oral)   Resp 15   Ht 5\' 8"  (1.727 m)   Wt 206 lb 12.8 oz (93.8 kg)   SpO2 98%   BMI 31.44 kg/m   Body mass index is 31.44 kg/m.  Advanced Directives 06/10/2017 06/09/2016 06/10/2015  Does Patient Have a Medical Advance Directive? Yes Yes Yes  Type of Paramedic of [place];Living will [place];Living will [place];Living will  Does patient want to make changes to medical advance directive? No - Patient declined No - Patient declined -  Copy of [name] in Chart? No - copy requested No - copy requested No - copy requested    Tobacco Social History   Tobacco Use  Smoking Status Former Smoker  . Types: Cigarettes  . Last attempt to quit: 10/18/1980  . Years since quitting: 36.6  Smokeless Tobacco Former Systems developer  . Types: Chew  . Quit date: 10/19/1990     Counseling given: Not Answered   Clinical Intake:  Pre-visit preparation completed: Yes  Pain : No/denies pain     Nutritional Status: BMI > 30  Obese Diabetes: Yes(Followed by pcp)  How often do you need to have someone help you when you read instructions, pamphlets, or other written materials from your doctor or pharmacy?: 1 - Never  Interpreter Needed?: No     Past Medical History:  Diagnosis Date  . Diabetes mellitus    Type 2  . Hyperlipidemia   . Hypertension   . Peripheral neuropathy 01/2007   positive EMG studies, negative workup for causes  .  Positive PPD, treated 1970   Past Surgical History:  Procedure Laterality Date  . COLONOSCOPY  2004, 2014   Dr Bary Castilla  . COLONOSCOPY WITH PROPOFOL N/A 04/30/2015   Procedure: COLONOSCOPY WITH PROPOFOL;  Surgeon: Robert Bellow, MD;  Location: [facility] ENDOSCOPY;  Service: Endoscopy;  Laterality: N/A;  . ROTATOR CUFF REPAIR  2005   right (Dr. Francia Greaves)  . TONSILLECTOMY    . WRIST FRACTURE SURGERY Left    Armour, Ted    Family History  Problem Relation Age of Onset  . Heart attack Father   . Dementia Sister   . Kidney disease Sister   . Cancer Neg Hx    Social History   Socioeconomic History  . Marital status: Married    Spouse name: Not on file  . Number of children: Not on file  . Years of education: Not on file  . Highest education level: Not on file  Occupational History  . Not on file  Social Needs  . Financial resource strain: Not hard at all  . Food insecurity:    Worry: Never true    Inability: Never true  . Transportation needs:    Medical: No    Non-medical: No  Tobacco Use  . Smoking status: Former Smoker    Types: Cigarettes    Last attempt to quit: 10/18/1980    Years since quitting: 36.6  . Smokeless tobacco: Former  User    Types: [name]    Quit date: 10/19/1990  Substance and Sexual Activity  . Alcohol use: Never    Frequency: Never  . Drug use: No  . Sexual activity: Not Currently  Lifestyle  . Physical activity:    Days per week: 3 days    Minutes per session: 60 min  . Stress: Not at all  Relationships  . Social connections:    Talks on phone: Not on file    Gets together: Not on file    Attends religious service: Not on file    Active member of club or organization: Not on file    Attends meetings of clubs or organizations: Not on file    Relationship status: Not on file  Other Topics Concern  . Not on file  Social History Narrative  . Not on file    Outpatient Encounter Medications as of 06/10/2017  Medication Sig  .  amLODipine-benazepril (LOTREL) 10-40 MG capsule Take 1 capsule by mouth daily.  Marland Kitchen aspirin 81 MG tablet Take 81 mg by mouth daily.    . Choline Fenofibrate (FENOFIBRIC ACID) 135 MG CPDR Take 1 capsule by mouth daily.  Marland Kitchen co-enzyme Q-10 30 MG capsule Take 30 mg by mouth daily.    . cyanocobalamin (,VITAMIN B-12,) 1000 MCG/ML injection Inject 1 mL (1,000 mcg total) into the muscle every 30 (thirty) days.  . fish oil-omega-3 fatty acids 1000 MG capsule Take 2 g by mouth daily.    Marland Kitchen glipiZIDE (GLUCOTROL) 5 MG tablet TAKE 1/2 TABLET TWICE DAILY WITH MEALS  . metFORMIN (GLUCOPHAGE) 1000 MG tablet TAKE ONE TABLET TWICE A DAY WITH MEALS  . Multiple Vitamin (MULTIVITAMIN) tablet Take 1 tablet by mouth daily.    Marland Kitchen NEEDLE, DISP, 25 G 83M X 1" MISC 1 application by Does not apply route every 30 (thirty) days.  . rosuvastatin (CRESTOR) 40 MG tablet TAKE ONE TABLET EVERY EVENING  . Syringe/Needle, Disp, (SYRINGE 3CC/25GX1") 25G X 1" 3 ML MISC 1 application by Does not apply route every 30 (thirty) days. For B12 injections   No facility-administered encounter medications on file as of 06/10/2017.     Activities of Daily Living In your present state of health, do you have any difficulty performing the following activities: 06/10/2017  Hearing? N  Vision? N  Difficulty concentrating or making decisions? N  Walking or climbing stairs? Y  Comment Right drop foot; unsteady gait. Braces in use.   Dressing or bathing? N  Doing errands, shopping? N  Preparing Food and eating ? N  Using the Toilet? N  In the past six months, have you accidently leaked urine? N  Do you have problems with loss of bowel control? N  Managing your Medications? N  Managing your Finances? N  Housekeeping or managing your Housekeeping? N  Some recent data might be hidden    Patient Care Team: Crecencio Mc, MD as PCP - General (Internal Medicine) Bary Castilla, Forest Gleason, MD (General Surgery) Crecencio Mc, MD (Internal Medicine)     Assessment:   This is a routine wellness examination for [name] [name].  The goal of the wellness visit is to assist the patient how to close the gaps in care and create a preventative care plan for the patient.   The roster of all physicians providing medical care to patient is listed in the Snapshot section of the chart.  Osteoporosis risk reviewed.    Safety issues reviewed; Smoke and carbon monoxide detectors in  the home. No firearms in the home. Wears seatbelts when driving or riding with others. No violence in the home.  They do not have excessive sun exposure.  Discussed the need for sun protection: hats, long sleeves and the use of sunscreen if there is significant sun exposure.  Patient is alert, normal appearance, oriented to person/place/and time.  Correctly identified the president of the [place] and recalls of 3/3 words. Performs simple calculations and can read correct time from watch face. Displays appropriate judgement.  No new identified risk were noted.  No failures at ADL's or IADL's.    BMI- discussed the importance of a healthy diet, water intake and the benefits of aerobic exercise. Educational material provided.   24 hour diet recall: Regular diet   Dental- every 4 months.  Eye- Visual acuity not assessed per patient preference since they have regular follow up with the ophthalmologist.  Wears corrective lenses.  Sleep patterns- Sleeps 8 hours at night.  Wakes feeling rested.  Health maintenance gaps- closed.  Patient Concerns: None at this time. Follow up with PCP as needed.  Exercise Activities and Dietary recommendations Current Exercise Habits: Home exercise routine, Type of exercise: walking;strength training/weights;treadmill;stretching;calisthenics, Time (Minutes): 60, Frequency (Times/Week): 3, Weekly Exercise (Minutes/Week): 180, Intensity: Moderate  Goals    . DIET - INCREASE WATER INTAKE       Fall Risk Fall Risk  06/10/2017 06/09/2016 09/17/2015  06/13/2015 06/10/2015  Falls in the past year? No No No No No  Risk for fall due to : - - - - -    Depression Screen PHQ 2/9 Scores 06/10/2017 06/09/2016 09/17/2015 06/13/2015  PHQ - 2 Score 0 0 0 0  PHQ- 9 Score - 0 - -    Cognitive Function MMSE - Mini Mental State Exam 06/09/2016 06/10/2015  Orientation to time 5 5  Orientation to Place 5 5  Registration 3 3  Attention/ Calculation 5 5  Recall 3 3  Language- name 2 objects 2 2  Language- repeat 1 1  Language- follow 3 step command 3 3  Language- read & follow direction 1 1  Write a sentence 1 1  Copy design 1 1  Total score 30 30     6CIT Screen 06/10/2017  What Year? 0 points  What month? 0 points  What time? 0 points  Count back from 20 0 points  Months in reverse 0 points  Repeat phrase 0 points  Total Score 0    Immunization History  Administered Date(s) Administered  . Influenza Split 09/29/2011  . Influenza, High Dose Seasonal PF 11/11/2014, 09/17/2015, 10/22/2016  . Influenza,inj,Quad PF,6+ Mos 10/31/2012, 10/13/2013  . Pneumococcal Conjugate-13 04/16/2013  . Pneumococcal Polysaccharide-23 07/15/2010, 03/14/2015  . Tdap 06/29/2010  . Zoster 05/11/2010    Screening Tests Health Maintenance  Topic Date Due  . INFLUENZA VACCINE  08/18/2017  . HEMOGLOBIN A1C  10/25/2017  . FOOT EXAM  01/24/2018  . OPHTHALMOLOGY EXAM  02/02/2018  . TETANUS/TDAP  06/28/2020  . COLONOSCOPY  04/29/2025  . PNA vac Low Risk Adult  Completed      Plan:    End of life planning; Advance aging; Advanced directives discussed. Copy of current HCPOA/Living Will requested.    I have personally reviewed and noted the following in the patient's chart:   . Medical and social history . Use of alcohol, tobacco or illicit drugs  . Current medications and supplements . Functional ability and status . Nutritional status . Physical activity . Advanced  directives . List of other physicians . Hospitalizations, surgeries, and ER visits in  previous 12 months . Vitals . Screenings to include cognitive, depression, and falls . Referrals and appointments  In addition, I have reviewed and discussed with patient certain preventive protocols, quality metrics, and best practice recommendations. A written personalized care plan for preventive services as well as general preventive health recommendations were provided to patient.     OBrien-Blaney, Eilee Schader L, LPN  0/51/1021    I have reviewed the above information and agree with above.   Deborra Medina, MD

## 2017-06-17 ENCOUNTER — Other Ambulatory Visit: Payer: Self-pay | Admitting: Internal Medicine

## 2017-07-29 ENCOUNTER — Other Ambulatory Visit: Payer: Self-pay | Admitting: Internal Medicine

## 2017-08-10 ENCOUNTER — Other Ambulatory Visit: Payer: Self-pay | Admitting: Internal Medicine

## 2017-08-25 ENCOUNTER — Ambulatory Visit: Payer: Medicare Other | Admitting: Internal Medicine

## 2017-08-25 ENCOUNTER — Encounter: Payer: Self-pay | Admitting: Internal Medicine

## 2017-08-25 VITALS — BP 112/70 | HR 65 | Temp 97.6°F | Resp 16 | Ht 68.0 in | Wt 203.2 lb

## 2017-08-25 DIAGNOSIS — E1169 Type 2 diabetes mellitus with other specified complication: Secondary | ICD-10-CM | POA: Diagnosis not present

## 2017-08-25 DIAGNOSIS — H6122 Impacted cerumen, left ear: Secondary | ICD-10-CM

## 2017-08-25 DIAGNOSIS — E782 Mixed hyperlipidemia: Secondary | ICD-10-CM | POA: Diagnosis not present

## 2017-08-25 DIAGNOSIS — H6120 Impacted cerumen, unspecified ear: Secondary | ICD-10-CM | POA: Insufficient documentation

## 2017-08-25 DIAGNOSIS — E1142 Type 2 diabetes mellitus with diabetic polyneuropathy: Secondary | ICD-10-CM | POA: Diagnosis not present

## 2017-08-25 DIAGNOSIS — Z125 Encounter for screening for malignant neoplasm of prostate: Secondary | ICD-10-CM | POA: Diagnosis not present

## 2017-08-25 DIAGNOSIS — Z0000 Encounter for general adult medical examination without abnormal findings: Secondary | ICD-10-CM | POA: Diagnosis not present

## 2017-08-25 DIAGNOSIS — I10 Essential (primary) hypertension: Secondary | ICD-10-CM

## 2017-08-25 LAB — COMPREHENSIVE METABOLIC PANEL
ALBUMIN: 4.3 g/dL (ref 3.5–5.2)
ALT: 16 U/L (ref 0–53)
AST: 18 U/L (ref 0–37)
Alkaline Phosphatase: 42 U/L (ref 39–117)
BUN: 15 mg/dL (ref 6–23)
CO2: 25 mEq/L (ref 19–32)
CREATININE: 1.12 mg/dL (ref 0.40–1.50)
Calcium: 10 mg/dL (ref 8.4–10.5)
Chloride: 105 mEq/L (ref 96–112)
GFR: 67.87 mL/min (ref >60.00)
Glucose, Bld: 92 mg/dL (ref 70–99)
POTASSIUM: 4.5 meq/L (ref 3.5–5.1)
SODIUM: 141 meq/L (ref 135–145)
Total Bilirubin: 0.4 mg/dL (ref 0.2–1.2)
Total Protein: 6.7 g/dL (ref 6.0–8.3)

## 2017-08-25 LAB — LIPID PANEL
CHOLESTEROL: 129 mg/dL (ref 0–200)
HDL: 40.5 mg/dL (ref >39.00)
LDL Cholesterol: 57 mg/dL (ref 0–99)
NonHDL: 88.19
Total CHOL/HDL Ratio: 3
Triglycerides: 157 mg/dL — ABNORMAL HIGH (ref 0.0–149.0)
VLDL: 31.4 mg/dL (ref 0.0–40.0)

## 2017-08-25 LAB — HEMOGLOBIN A1C: Hgb A1c MFr Bld: 6.8 % — ABNORMAL HIGH (ref 4.6–6.5)

## 2017-08-25 LAB — PSA, MEDICARE: PSA: 1.61 ng/ml (ref 0.10–4.00)

## 2017-08-25 MED ORDER — ZOSTER VAC RECOMB ADJUVANTED 50 MCG/0.5ML IM SUSR: 0.5000 mL | Freq: Once | INTRAMUSCULAR | 1 refills | Status: AC

## 2017-08-25 NOTE — Assessment & Plan Note (Signed)
Referring to RN for irrigation

## 2017-08-25 NOTE — Progress Notes (Signed)
Patient ID: [name] [name], male    DOB: [birth year]  Age: 77 y.o. MRN: [id number]  The patient is here for annual preventive  examination and management of other chronic and acute problems.    Has given up alcohol after neuroapthy evaluation Using ankle braces , NO FALLS APPETITE smaller,  No pain .losing weight Exercising 3 times per week   Lab Results  Component Value Date   PSA 1.61 08/25/2017   PSA 1.78 04/21/2016   PSA 2.39 03/14/2015    The risk factors are reflected in the social history.  The roster of all physicians providing medical care to patient - is listed in the Snapshot section of the chart.  Activities of daily living:  The patient is 100% independent in all ADLs: dressing, toileting, feeding as well as independent mobility  Home safety : The patient has smoke detectors in the home. They wear seatbelts.  There are no firearms at home. There is no violence in the home.   There is no risks for hepatitis, STDs or HIV. There is no   history of blood transfusion. They have no travel history to infectious disease endemic areas of the world.  The patient has seen their dentist in the last six month. They have seen their eye doctor in the last year. They admit to slight hearing difficulty with regard to whispered voices and some television programs.  They have deferred audiologic testing in the last year.  They do not  have excessive sun exposure. Discussed the need for sun protection: hats, long sleeves and use of sunscreen if there is significant sun exposure.   Diet: the importance of a healthy diet is discussed. They do have a healthy diet.  The benefits of regular aerobic exercise were discussed. She walks 4 times per week ,  20 minutes.   Depression screen: there are no signs or vegative symptoms of depression- irritability, change in appetite, anhedonia, sadness/tearfullness.  Cognitive assessment: the patient manages all their financial and personal affairs and is  actively engaged. They could relate day,date,year and events; recalled 2/3 objects at 3 minutes; performed clock-face test normally.  The following portions of the patient's history were reviewed and updated as appropriate: allergies, current medications, past family history, past medical history,  past surgical history, past social history  and problem list.  Visual acuity was not assessed per patient preference since she has regular follow up with her ophthalmologist. Hearing and body mass index were assessed and reviewed.   During the course of the visit the patient was educated and counseled about appropriate screening and preventive services including : fall prevention , diabetes screening, nutrition counseling, colorectal cancer screening, and recommended immunizations.    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 Diagnoses of Prostate cancer screening, Mixed hyperlipidemia, Impacted cerumen of left ear, Mixed hyperlipidemia due to type 2 diabetes mellitus ([place]), Essential hypertension, and Encounter for preventive health examination were also pertinent to this visit.  History [name] [name] has a past medical history of Diabetes mellitus, Hyperlipidemia, Hypertension, Peripheral neuropathy (01/2007), and Positive PPD, treated (1970).   [name] [name] has a past surgical history that includes Tonsillectomy; Rotator cuff repair (2005); Colonoscopy (2004, 2014); Colonoscopy with propofol (N/A, 04/30/2015); and Wrist fracture surgery (Left).   His family history includes Dementia in his sister; Heart attack in his father; Kidney disease in his sister.[name] [name] reports that [name] [name] quit smoking about 36 years ago. His smoking use included  cigarettes. [name] [name] quit smokeless tobacco use about 26 years ago.  His smokeless tobacco use included chew. [name] [name] reports that [name] [name] does not drink alcohol or use drugs.  Outpatient Medications Prior to Visit  Medication Sig Dispense  Refill  . amLODipine-benazepril (LOTREL) 10-40 MG capsule Take 1 capsule by mouth daily. 90 capsule 1  . aspirin 81 MG tablet Take 81 mg by mouth daily.      . Choline Fenofibrate (FENOFIBRIC ACID) 135 MG CPDR TAKE 1 CAPSULE BY MOUTH ONCE DAILY 90 capsule 1  . co-enzyme Q-10 30 MG capsule Take 30 mg by mouth daily.      . cyanocobalamin (,VITAMIN B-12,) 1000 MCG/ML injection Inject 1 mL (1,000 mcg total) into the muscle every 30 (thirty) days. 10 mL 1  . fish oil-omega-3 fatty acids 1000 MG capsule Take 2 g by mouth daily.      Marland Kitchen glipiZIDE (GLUCOTROL) 5 MG tablet TAKE 1/2 TABLET TWICE DAILY WITH MEALS 90 tablet 1  . metFORMIN (GLUCOPHAGE) 1000 MG tablet TAKE ONE TABLET TWICE A DAY WITH MEALS 180 tablet 1  . Multiple Vitamin (MULTIVITAMIN) tablet Take 1 tablet by mouth daily.      Marland Kitchen NEEDLE, DISP, 25 G 53I X 1" MISC 1 application by Does not apply route every 30 (thirty) days. 6 each 0  . rosuvastatin (CRESTOR) 40 MG tablet TAKE 1 TABLET BY MOUTH EVERY EVENING 90 tablet 1  . Syringe/Needle, Disp, (SYRINGE 3CC/25GX1") 25G X 1" 3 ML MISC 1 application by Does not apply route every 30 (thirty) days. For B12 injections 6 each 0   No facility-administered medications prior to visit.     Review of Systems   Patient denies headache, fevers, malaise, unintentional weight loss, skin rash, eye pain, sinus congestion and sinus pain, sore throat, dysphagia,  hemoptysis , cough, dyspnea, wheezing, chest pain, palpitations, orthopnea, edema, abdominal pain, nausea, melena, diarrhea, constipation, flank pain, dysuria, hematuria, urinary  Frequency, nocturia, numbness, tingling, seizures,  Focal weakness, Loss of consciousness,  Tremor, insomnia, depression, anxiety, and suicidal ideation.      Objective:  BP 112/70 (BP Location: Left Arm, Patient Position: Sitting, Cuff Size: Normal)   Pulse 65   Temp 97.6 F (36.4 C) (Oral)   Resp 16   Ht 5\' 8"  (1.727 m)   Wt 203 lb 3.2 oz (92.2 kg)   SpO2 95%   BMI  30.90 kg/m   Physical Exam   General appearance: alert, cooperative and appears stated age Ears:left ear canal occluded with cerumen. Right TM normal  and external ear canal Throat: lips, mucosa, and tongue normal; teeth and gums normal Neck: no adenopathy, no carotid bruit, supple, symmetrical, trachea midline and thyroid not enlarged, symmetric, no tenderness/mass/nodules Back: symmetric, no curvature. ROM normal. No CVA tenderness. Lungs: clear to auscultation bilaterally Heart: regular rate and rhythm, S1, S2 normal, no murmur, click, rub or gallop Abdomen: soft, non-tender; bowel sounds normal; no masses,  no organomegaly Pulses: 2+ and symmetric Skin: Skin color, texture, turgor normal. No rashes or lesions Lymph nodes: Cervical, supraclavicular, and axillary nodes normal.    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  hemoglobin A1c  Has increased slightly due to holiday indulgences.  . Patient is up-to-date on eye exams  Patient has no proteinuria , Is taking a baby aspirin,  tolerating statin  therapy for CAD risk reduction and an ACE/ARB . His longstanding neuropathy is now managed with leg braces to prevent foot drop .  [name] [name] has d/c'd gabapentin .   Lab Results  Component Value Date   HGBA1C 6.8 (H) 08/25/2017   Lab Results  Component Value Date   MICROALBUR 1.4 04/25/2017         Relevant Orders   Hemoglobin A1c (Completed)   Comprehensive metabolic panel (Completed)   Mixed hyperlipidemia due to type 2 diabetes mellitus (HCC)    LDL and triglycerides are controlled with rosuvastatin and fenofibrate, and [name] [name] is taking a baby aspirin daily.  [name] [name] has no side effects and liver enzymes are normal. No changes today   Lab Results  Component Value Date   CHOL 129 08/25/2017   HDL 40.50 08/25/2017   LDLCALC 57 08/25/2017    LDLDIRECT 71.0 04/25/2017   TRIG 157.0 (H) 08/25/2017   CHOLHDL 3 08/25/2017   Lab Results  Component Value Date   ALT 16 08/25/2017   AST 18 08/25/2017   ALKPHOS 42 08/25/2017   BILITOT 0.4 08/25/2017         Hypertension    Well controlled on current regimen. Renal function stable, no changes today.  Lab Results  Component Value Date   CREATININE 1.12 08/25/2017   Lab Results  Component Value Date   NA 141 08/25/2017   K 4.5 08/25/2017   CL 105 08/25/2017   CO2 25 08/25/2017         Prostate cancer screening    Annual PSA has been done and is normal .  Lab Results  Component Value Date   PSA 1.61 08/25/2017   PSA 1.78 04/21/2016   PSA 2.39 03/14/2015         Relevant Orders   PSA, Medicare (Completed)   Encounter for preventive health examination    age appropriate education and counseling updated, referrals for preventative services and immunizations addressed, dietary and smoking counseling addressed, most recent labs reviewed.  I have personally reviewed and have noted:  1) the patient's medical and social history 2) The pt's use of alcohol, tobacco, and illicit drugs 3) The patient's current medications and supplements 4) Functional ability including ADL's, fall risk, home safety risk, hearing and visual impairment 5) Diet and physical activities 6) Evidence for depression or mood disorder 7) The patient's height, weight, and BMI have been recorded in the chart  I have made referrals, and provided counseling and education based on review of the above      Cerumen impaction    Referring to RN for irrigation        Other Visit Diagnoses    Mixed hyperlipidemia       Relevant Orders   Lipid panel (Completed)      I am having Chosen C. Hennen start on Zoster Vaccine Adjuvanted. I am also having him maintain his aspirin, co-enzyme Q-10, fish oil-omega-3 fatty acids, multivitamin, SYRINGE 3CC/25GX1", NEEDLE (DISP) 25 G, amLODipine-benazepril,  cyanocobalamin, glipiZIDE, metFORMIN, Fenofibric Acid, and rosuvastatin.  Meds ordered this encounter  Medications  . Zoster Vaccine Adjuvanted [facility] injection    Sig: Inject 0.5 mLs into the muscle once for 1 dose.    Dispense:  1 each    Refill:  1    There are no discontinued medications.  Follow-up: Return in about 4 months (around 12/25/2017).   Crecencio Mc, MD

## 2017-08-25 NOTE — Patient Instructions (Addendum)
The ShingRx vaccine is now available in local pharmacies and is much more protective thant Zostavaxs,  It is therefore ADVISED for all interested adults over 50 to prevent shingles   I have given you a prescription     Your left ear canal is occluded with cerumen (wax).  Please start using Debrox liquid drops  In that ear each night to soften the wax,  Then return for our RN to irrigate the wax.   Your next colonoscopy is not due until 2022    Health Maintenance, Male A healthy lifestyle and preventive care is important for your health and wellness. Ask your health care provider about what schedule of regular examinations is right for you. What should I know about weight and diet? Eat a Healthy Diet  Eat plenty of vegetables, fruits, whole grains, low-fat dairy products, and lean protein.  Do not eat a lot of foods high in solid fats, added sugars, or salt.  Maintain a Healthy Weight Regular exercise can help you achieve or maintain a healthy weight. You should:  Do at least 150 minutes of exercise each week. The exercise should increase your heart rate and make you sweat (moderate-intensity exercise).  Do strength-training exercises at least twice a week.  Watch Your Levels of Cholesterol and Blood Lipids  Have your blood tested for lipids and cholesterol every 5 years starting at 75 years of age. If you are at high risk for heart disease, you should start having your blood tested when you are 75 years old. You may need to have your cholesterol levels checked more often if: ? Your lipid or cholesterol levels are high. ? You are older than 75 years of age. ? You are at high risk for heart disease.  What should I know about cancer screening? Many types of cancers can be detected early and may often be prevented. Lung Cancer  You should be screened every year for lung cancer if: ? You are a current smoker who has smoked for at least 30 years. ? You are a former smoker who has  quit within the past 15 years.  Talk to your health care provider about your screening options, when you should start screening, and how often you should be screened.  Colorectal Cancer  Routine colorectal cancer screening usually begins at 75 years of age and should be repeated every 5-10 years until you are 75 years old. You may need to be screened more often if early forms of precancerous polyps or small growths are found. Your health care provider may recommend screening at an earlier age if you have risk factors for colon cancer.  Your health care provider may recommend using home test kits to check for hidden blood in the stool.  A small camera at the end of a tube can be used to examine your colon (sigmoidoscopy or colonoscopy). This checks for the earliest forms of colorectal cancer.  Prostate and Testicular Cancer  Depending on your age and overall health, your health care provider may do certain tests to screen for prostate and testicular cancer.  Talk to your health care provider about any symptoms or concerns you have about testicular or prostate cancer.  Skin Cancer  Check your skin from head to toe regularly.  Tell your health care provider about any new moles or changes in moles, especially if: ? There is a change in a mole's size, shape, or color. ? You have a mole that is larger than a pencil eraser.  Always use sunscreen. Apply sunscreen liberally and repeat throughout the day.  Protect yourself by wearing long sleeves, pants, a wide-brimmed hat, and sunglasses when outside.  What should I know about heart disease, diabetes, and high blood pressure?  If you are 57-70 years of age, have your blood pressure checked every 3-5 years. If you are 24 years of age or older, have your blood pressure checked every year. You should have your blood pressure measured twice-once when you are at a hospital or clinic, and once when you are not at a hospital or clinic. Record the  average of the two measurements. To check your blood pressure when you are not at a hospital or clinic, you can use: ? An automated blood pressure machine at a pharmacy. ? A home blood pressure monitor.  Talk to your health care provider about your target blood pressure.  If you are between 66-50 years old, ask your health care provider if you should take aspirin to prevent heart disease.  Have regular diabetes screenings by checking your fasting blood sugar level. ? If you are at a normal weight and have a low risk for diabetes, have this test once every three years after the age of 66. ? If you are overweight and have a high risk for diabetes, consider being tested at a younger age or more often.  A one-time screening for abdominal aortic aneurysm (AAA) by ultrasound is recommended for men aged 35-75 years who are current or former smokers. What should I know about preventing infection? Hepatitis B If you have a higher risk for hepatitis B, you should be screened for this virus. Talk with your health care provider to find out if you are at risk for hepatitis B infection. Hepatitis C Blood testing is recommended for:  Everyone born from [birth year] through 1963.  Anyone with known risk factors for hepatitis C.  Sexually Transmitted Diseases (STDs)  You should be screened each year for STDs including gonorrhea and chlamydia if: ? You are sexually active and are younger than 74 years of age. ? You are older than 75 years of age and your health care provider tells you that you are at risk for this type of infection. ? Your sexual activity has changed since you were last screened and you are at an increased risk for chlamydia or gonorrhea. Ask your health care provider if you are at risk.  Talk with your health care provider about whether you are at high risk of being infected with HIV. Your health care provider may recommend a prescription medicine to help prevent HIV infection.  What else can  I do?  Schedule regular health, dental, and eye exams.  Stay current with your vaccines (immunizations).  Do not use any tobacco products, such as cigarettes, chewing tobacco, and e-cigarettes. If you need help quitting, ask your health care provider.  Limit alcohol intake to no more than 2 drinks per day. One drink equals 12 ounces of beer, 5 ounces of wine, or 1 ounces of hard liquor.  Do not use street drugs.  Do not share needles.  Ask your health care provider for help if you need support or information about quitting drugs.  Tell your health care provider if you often feel depressed.  Tell your health care provider if you have ever been abused or do not feel safe at home. This information is not intended to replace advice given to you by your health care provider. Make sure you discuss any  questions you have with your health care provider. Document Released: 07/03/2007 Document Revised: 09/03/2015 Document Reviewed: 10/08/2014 Elsevier Interactive Patient Education  [employer].

## 2017-08-25 NOTE — Progress Notes (Signed)
Patient scheduled.

## 2017-08-27 NOTE — Assessment & Plan Note (Signed)
Well controlled on current regimen. Renal function stable, no changes today.  Lab Results  Component Value Date   CREATININE 1.12 08/25/2017   Lab Results  Component Value Date   NA 141 08/25/2017   K 4.5 08/25/2017   CL 105 08/25/2017   CO2 25 08/25/2017

## 2017-08-27 NOTE — Assessment & Plan Note (Signed)
LDL and triglycerides are controlled with rosuvastatin and fenofibrate, and he is taking a baby aspirin daily.  He has no side effects and liver enzymes are normal. No changes today   Lab Results  Component Value Date   CHOL 129 08/25/2017   HDL 40.50 08/25/2017   LDLCALC 57 08/25/2017   LDLDIRECT 71.0 04/25/2017   TRIG 157.0 (H) 08/25/2017   CHOLHDL 3 08/25/2017   Lab Results  Component Value Date   ALT 16 08/25/2017   AST 18 08/25/2017   ALKPHOS 42 08/25/2017   BILITOT 0.4 08/25/2017

## 2017-08-27 NOTE — Assessment & Plan Note (Signed)
Annual PSA has been done and is normal .  Lab Results  Component Value Date   PSA 1.61 08/25/2017   PSA 1.78 04/21/2016   PSA 2.39 03/14/2015

## 2017-08-27 NOTE — Assessment & Plan Note (Signed)
well-controlled on current medications.  hemoglobin A1c  Has increased slightly due to holiday indulgences.  . Patient is up-to-date on eye exams  Patient has no proteinuria , Is taking a baby aspirin,  tolerating statin therapy for CAD risk reduction and an ACE/ARB . His longstanding neuropathy is now managed with leg braces to prevent foot drop .  He has d/c'd gabapentin .   Lab Results  Component Value Date   HGBA1C 6.8 (H) 08/25/2017   Lab Results  Component Value Date   MICROALBUR 1.4 04/25/2017

## 2017-08-27 NOTE — Assessment & Plan Note (Signed)

## 2017-09-01 ENCOUNTER — Ambulatory Visit (INDEPENDENT_AMBULATORY_CARE_PROVIDER_SITE_OTHER): Payer: Medicare Other

## 2017-09-01 DIAGNOSIS — H6122 Impacted cerumen, left ear: Secondary | ICD-10-CM

## 2017-09-01 NOTE — Progress Notes (Addendum)
Patient came in to have his Left Ear lavage. Patient tolerated procedure very well and had no complaints, comments, and or concerns.   I have reviewed the above information and agree with above.   Deborra Medina, MD

## 2017-09-13 ENCOUNTER — Other Ambulatory Visit: Payer: Self-pay | Admitting: Internal Medicine

## 2017-10-27 ENCOUNTER — Telehealth: Payer: Self-pay

## 2017-10-27 NOTE — Telephone Encounter (Signed)
Tried to return  Cal to patient no answer and no voicemail.

## 2017-10-27 NOTE — Telephone Encounter (Signed)
Copied from [place] [id number]. Topic: Appointment Scheduling - Scheduling Inquiry for Clinic >> Oct 26, 2017  6:22 PM Sheran Luz wrote: Pt would like to know if he can be worked in on Friday morning for his flu shot with the nurse. Pt is requesting a call back.

## 2017-11-03 ENCOUNTER — Ambulatory Visit (INDEPENDENT_AMBULATORY_CARE_PROVIDER_SITE_OTHER): Payer: Medicare Other

## 2017-11-03 DIAGNOSIS — Z23 Encounter for immunization: Secondary | ICD-10-CM | POA: Diagnosis not present

## 2017-11-21 ENCOUNTER — Other Ambulatory Visit: Payer: Self-pay | Admitting: Internal Medicine

## 2017-11-30 DIAGNOSIS — C4491 Basal cell carcinoma of skin, unspecified: Secondary | ICD-10-CM | POA: Insufficient documentation

## 2017-12-19 ENCOUNTER — Other Ambulatory Visit: Payer: Self-pay | Admitting: Internal Medicine

## 2017-12-26 ENCOUNTER — Ambulatory Visit: Payer: Medicare Other | Admitting: Internal Medicine

## 2017-12-26 ENCOUNTER — Encounter: Payer: Self-pay | Admitting: Internal Medicine

## 2017-12-26 VITALS — BP 120/74 | HR 81 | Temp 97.6°F | Resp 15 | Ht 68.0 in | Wt 203.0 lb

## 2017-12-26 DIAGNOSIS — E782 Mixed hyperlipidemia: Secondary | ICD-10-CM

## 2017-12-26 DIAGNOSIS — I10 Essential (primary) hypertension: Secondary | ICD-10-CM | POA: Diagnosis not present

## 2017-12-26 DIAGNOSIS — Z125 Encounter for screening for malignant neoplasm of prostate: Secondary | ICD-10-CM | POA: Diagnosis not present

## 2017-12-26 DIAGNOSIS — E1169 Type 2 diabetes mellitus with other specified complication: Secondary | ICD-10-CM

## 2017-12-26 DIAGNOSIS — E1142 Type 2 diabetes mellitus with diabetic polyneuropathy: Secondary | ICD-10-CM

## 2017-12-26 LAB — COMPREHENSIVE METABOLIC PANEL
ALT: 18 U/L (ref 0–53)
AST: 21 U/L (ref 0–37)
Albumin: 4.3 g/dL (ref 3.5–5.2)
Alkaline Phosphatase: 47 U/L (ref 39–117)
BUN: 17 mg/dL (ref 6–23)
CO2: 26 mEq/L (ref 19–32)
Calcium: 10.4 mg/dL (ref 8.4–10.5)
Chloride: 106 mEq/L (ref 96–112)
Creatinine, Ser: 1.01 mg/dL (ref 0.40–1.50)
GFR: 76.4 mL/min (ref >60.00)
Glucose, Bld: 138 mg/dL — ABNORMAL HIGH (ref 70–99)
Potassium: 4.9 mEq/L (ref 3.5–5.1)
Sodium: 139 mEq/L (ref 135–145)
Total Bilirubin: 0.3 mg/dL (ref 0.2–1.2)
Total Protein: 7.1 g/dL (ref 6.0–8.3)

## 2017-12-26 LAB — LIPID PANEL
CHOL/HDL RATIO: 3
Cholesterol: 138 mg/dL (ref 0–200)
HDL: 42.8 mg/dL (ref >39.00)
LDL CALC: 62 mg/dL (ref 0–99)
NonHDL: 95.31
TRIGLYCERIDES: 165 mg/dL — AB (ref 0.0–149.0)
VLDL: 33 mg/dL (ref 0.0–40.0)

## 2017-12-26 LAB — MICROALBUMIN / CREATININE URINE RATIO
Creatinine,U: 131.2 mg/dL
MICROALB/CREAT RATIO: 1.9 mg/g (ref 0.0–30.0)
Microalb, Ur: 2.6 mg/dL — ABNORMAL HIGH (ref 0.0–1.9)

## 2017-12-26 LAB — HEMOGLOBIN A1C: Hgb A1c MFr Bld: 6.8 % — ABNORMAL HIGH (ref 4.6–6.5)

## 2017-12-26 NOTE — Progress Notes (Signed)
Subjective:  Patient ID: [name] [name], male    DOB: 05/06/1940  Age: 77 y.o. MRN: [id number]  CC: The primary encounter diagnosis was Mixed hyperlipidemia due to type 2 diabetes mellitus ([place]). 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  HPI [name] [name] presents for 3 month follow up on diabetes.  Patient has no complaints today.  Patient is following a low glycemic index diet and taking all prescribed medications regularly without side effects.  Fasting sugars have been under less than 140 most of the time and post prandials have been under 160 except on rare occasions. Patient is exercising about 3 times per week and intentionally trying to lose weight .  Patient has had an eye exam in the last 12 months and checks feet regularly for signs of infection.  Patient does not walk barefoot outside,  And continues to wear leg braces to manage his foot drop due to diabetic neuropathy  Patient is up to date on all recommended vaccinations   Recurrent hypoglycemia in late night /early morning occurring about once per month   Outpatient Medications Prior to Visit  Medication Sig Dispense Refill  . amLODipine-benazepril (LOTREL) 10-40 MG capsule TAKE 1 CAPSULE EVERY DAY 90 capsule 1  . aspirin 81 MG tablet Take 81 mg by mouth daily.      . B-D 3CC LUER-LOK SYR 25GX1" 25G X 1" 3 ML MISC USE AS DIRECTED FOR B12 INJECTIONS 6 each 0  . Choline Fenofibrate (FENOFIBRIC ACID) 135 MG CPDR TAKE 1 CAPSULE BY MOUTH ONCE DAILY 90 capsule 1  . co-enzyme Q-10 30 MG capsule Take 30 mg by mouth daily.      . cyanocobalamin (,VITAMIN B-12,) 1000 MCG/ML injection Inject 1 mL (1,000 mcg total) into the muscle every 30 (thirty) days. 10 mL 1  . fish oil-omega-3 fatty acids 1000 MG capsule Take 2 g by mouth daily.      Marland Kitchen glipiZIDE (GLUCOTROL) 5 MG tablet TAKE ONE-HALF TABLET TWICE DAILY WITH  MEALS 90 tablet 1  . metFORMIN (GLUCOPHAGE) 1000 MG tablet TAKE ONE TABLET TWICE A DAY WITH MEALS 180 tablet 1  . Multiple Vitamin (MULTIVITAMIN) tablet Take 1 tablet by mouth daily.      Marland Kitchen NEEDLE, DISP, 25 G 83J X 1" MISC 1 application by Does not apply route every 30 (thirty) days. 6 each 0  . rosuvastatin (CRESTOR) 40 MG tablet TAKE 1 TABLET BY MOUTH EVERY EVENING 90 tablet 1   No facility-administered medications prior to visit.     Review of Systems;  Patient denies headache, fevers, malaise, unintentional weight loss, skin rash, eye pain, sinus congestion and sinus pain, sore throat, dysphagia,  hemoptysis , cough, dyspnea, wheezing, chest pain, palpitations, orthopnea, edema, abdominal pain, nausea, melena, diarrhea, constipation, flank pain, dysuria, hematuria, urinary  Frequency, nocturia, numbness, tingling, seizures,  Focal weakness, Loss of consciousness,  Tremor, insomnia, depression, anxiety, and suicidal ideation.      Objective:  BP 120/74 (BP Location: Left Arm, Patient Position: Sitting, Cuff Size: Normal)   Pulse 81   Temp 97.6 F (36.4 C) (Oral)   Resp 15   Ht 5\' 8"  (1.727 m)   Wt 203 lb (92.1 kg)   SpO2 94%   BMI 30.87 kg/m   BP Readings from Last 3 Encounters:  12/26/17 120/74  08/25/17 112/70  06/10/17 114/70    Wt Readings  from Last 3 Encounters:  12/26/17 203 lb (92.1 kg)  08/25/17 203 lb 3.2 oz (92.2 kg)  06/10/17 206 lb 12.8 oz (93.8 kg)    General appearance: alert, cooperative and appears stated age Ears: normal TM's and external ear canals both ears Throat: lips, mucosa, and tongue normal; teeth and gums normal Neck: no adenopathy, no carotid bruit, supple, symmetrical, trachea midline and thyroid not enlarged, symmetric, no tenderness/mass/nodules Back: symmetric, no curvature. ROM normal. No CVA tenderness. Lungs: clear to auscultation bilaterally Heart: regular rate and rhythm, S1, S2 normal, no murmur, click, rub or gallop Abdomen: soft,  non-tender; bowel sounds normal; no masses,  no organomegaly Pulses: 2+ and symmetric Skin: Skin color, texture, turgor normal. No rashes or lesions Lymph nodes: Cervical, supraclavicular, and axillary nodes normal.  Lab Results  Component Value Date   HGBA1C 6.8 (H) 12/26/2017   HGBA1C 6.8 (H) 08/25/2017   HGBA1C 6.9 (H) 04/25/2017    Lab Results  Component Value Date   CREATININE 1.01 12/26/2017   CREATININE 1.12 08/25/2017   CREATININE 1.05 04/25/2017    Lab Results  Component Value Date   GLUCOSE 138 (H) 12/26/2017   CHOL 138 12/26/2017   TRIG 165.0 (H) 12/26/2017   HDL 42.80 12/26/2017   LDLDIRECT 71.0 04/25/2017   LDLCALC 62 12/26/2017   ALT 18 12/26/2017   AST 21 12/26/2017   NA 139 12/26/2017   K 4.9 12/26/2017   CL 106 12/26/2017   CREATININE 1.01 12/26/2017   BUN 17 12/26/2017   CO2 26 12/26/2017   TSH 0.69 01/24/2017   PSA 1.61 08/25/2017   HGBA1C 6.8 (H) 12/26/2017   MICROALBUR 2.6 (H) 12/26/2017    No results found.  Assessment & Plan:   Problem List Items Addressed This Visit    Hypertension    Well controlled on current regimen. Renal function stable, no changes today.  Lab Results  Component Value Date   CREATININE 1.01 12/26/2017   Lab Results  Component Value Date   NA 139 12/26/2017   K 4.9 12/26/2017   CL 106 12/26/2017   CO2 26 12/26/2017         Mixed hyperlipidemia due to type 2 diabetes mellitus ([place]) - Primary    LDL and triglycerides are controlled with rosuvastatin and fenofibrate, and he is taking a baby aspirin daily.  He has no side effects and liver enzymes are normal. 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  . Patient is up-to-date on eye exams  Patient has no proteinuria , Is taking a baby aspirin,  tolerating statin therapy for CAD risk reduction and an ACE/ARB . His longstanding neuropathy is now managed with leg braces to prevent foot drop .  He has d/c'd gabapentin .   Lab Results  Component Value Date   HGBA1C 6.8 (H) 12/26/2017   Lab Results  Component Value Date  MICROALBUR 2.6 (H) 12/26/2017         Relevant Orders   Hemoglobin A1c (Completed)   Comprehensive metabolic panel (Completed)   Microalbumin / creatinine urine ratio (Completed)      I am having [name] [name] maintain his aspirin, co-enzyme Q-10, fish oil-omega-3 fatty acids, multivitamin, NEEDLE (DISP) 25 G, cyanocobalamin, metFORMIN, Fenofibric Acid, rosuvastatin, B-D 3CC LUER-LOK SYR 25GX1", glipiZIDE, and amLODipine-benazepril.  No orders of the defined types were placed in this encounter.   There are no discontinued medications.  Follow-up: Return in about 6 months (around 06/27/2018) for early june , follow up diabetes.   Crecencio Mc, MD

## 2017-12-26 NOTE — Patient Instructions (Signed)
You are doing well!  Your weight loss continues  ,excellent work!  I'll see you in June    Diabetes Mellitus and Standards of Medical Care Managing diabetes (diabetes mellitus) can be complicated. Your diabetes treatment may be managed by a team of health care providers, including:  A diet and nutrition specialist (registered dietitian).  A nurse.  A certified diabetes educator (CDE).  A diabetes specialist (endocrinologist).  An eye doctor.  A primary care provider.  A dentist.  Your health care providers follow a schedule in order to help you get the best quality of care. The following schedule is a general guideline for your diabetes management plan. Your health care providers may also give you more specific instructions. HbA1c ( hemoglobin A1c) test This test provides information about blood sugar (glucose) control over the previous 2-3 months. It is used to check whether your diabetes management plan needs to be adjusted.  If you are meeting your treatment goals, this test is done at least 2 times a year.  If you are not meeting treatment goals or if your treatment goals have changed, this test is done 4 times a year.  Blood pressure test  This test is done at every routine medical visit. For most people, the goal is less than 130/80. Ask your health care provider what your goal blood pressure should be. Dental and eye exams  Visit your dentist two times a year.  If you have type 1 diabetes, get an eye exam 3-5 years after you are diagnosed, and then once a year after your first exam. ? If you were diagnosed with type 1 diabetes as a child, get an eye exam when you are age 56 or older and have had diabetes for 3-5 years. After the first exam, you should get an eye exam once a year.  If you have type 2 diabetes, have an eye exam as soon as you are diagnosed, and then once a year after your first exam. Foot care exam  Visual foot exams are done at every routine medical  visit. The exams check for cuts, bruises, redness, blisters, sores, or other problems with the feet.  A complete foot exam is done by your health care provider once a year. This exam includes an inspection of the structure and skin of your feet, and a check of the pulses and sensation in your feet. ? Type 1 diabetes: Get your first exam 3-5 years after diagnosis. ? Type 2 diabetes: Get your first exam as soon as you are diagnosed.  Check your feet every day for cuts, bruises, redness, blisters, or sores. If you have any of these or other problems that are not healing, contact your health care provider. Kidney function test ( urine microalbumin)  This test is done once a year. ? Type 1 diabetes: Get your first test 5 years after diagnosis. ? Type 2 diabetes: Get your first test as soon as you are diagnosed.  If you have chronic kidney disease (CKD), get a serum creatinine and estimated glomerular filtration rate (eGFR) test once a year. Lipid profile (cholesterol, HDL, LDL, triglycerides)  This test should be done when you are diagnosed with diabetes, and every 5 years after the first test. If you are on medicines to lower your cholesterol, you may need to get this test done every year. ? The goal for LDL is less than 100 mg/dL (5.5 mmol/L). If you are at high risk, the goal is less than 70 mg/dL (3.9  mmol/L). ? The goal for HDL is 40 mg/dL (2.2 mmol/L) for men and 50 mg/dL(2.8 mmol/L) for women. An HDL cholesterol of 60 mg/dL (3.3 mmol/L) or higher gives some protection against heart disease. ? The goal for triglycerides is less than 150 mg/dL (8.3 mmol/L). Immunizations  The yearly flu (influenza) vaccine is recommended for everyone 6 months or older who has diabetes.  The pneumonia (pneumococcal) vaccine is recommended for everyone 2 years or older who has diabetes. If you are 14 or older, you may get the pneumonia vaccine as a series of two separate shots.  The hepatitis B vaccine is  recommended for adults shortly after they have been diagnosed with diabetes.  The Tdap (tetanus, diphtheria, and pertussis) vaccine should be given: ? According to normal childhood vaccination schedules, for children. ? Every 10 years, for adults who have diabetes.  The shingles vaccine is recommended for people who have had chicken pox and are 50 years or older. Mental and emotional health  Screening for symptoms of eating disorders, anxiety, and depression is recommended at the time of diagnosis and afterward as needed. If your screening shows that you have symptoms (you have a positive screening result), you may need further evaluation and be referred to a mental health care provider. Diabetes self-management education  Education about how to manage your diabetes is recommended at diagnosis and ongoing as needed. Treatment plan  Your treatment plan will be reviewed at every medical visit. Summary  Managing diabetes (diabetes mellitus) can be complicated. Your diabetes treatment may be managed by a team of health care providers.  Your health care providers follow a schedule in order to help you get the best quality of care.  Standards of care including having regular physical exams, blood tests, blood pressure monitoring, immunizations, screening tests, and education about how to manage your diabetes.  Your health care providers may also give you more specific instructions based on your individual health. This information is not intended to replace advice given to you by your health care provider. Make sure you discuss any questions you have with your health care provider. Document Released: 11/01/2008 Document Revised: 10/03/2015 Document Reviewed: 10/03/2015 Elsevier Interactive Patient Education  [employer].

## 2017-12-27 NOTE — Assessment & Plan Note (Signed)
well-controlled on current medications.  . Patient is up-to-date on eye exams  Patient has no proteinuria , Is taking a baby aspirin,  tolerating statin therapy for CAD risk reduction and an ACE/ARB . His longstanding neuropathy is now managed with leg braces to prevent foot drop .  He has d/c'd gabapentin .   Lab Results  Component Value Date   HGBA1C 6.8 (H) 12/26/2017   Lab Results  Component Value Date   MICROALBUR 2.6 (H) 12/26/2017

## 2017-12-27 NOTE — Assessment & Plan Note (Signed)
LDL and triglycerides are controlled with rosuvastatin and fenofibrate, and he is taking a baby aspirin daily.  He has no side effects and liver enzymes are normal. No changes today   Lab Results  Component Value Date   CHOL 138 12/26/2017   HDL 42.80 12/26/2017   LDLCALC 62 12/26/2017   LDLDIRECT 71.0 04/25/2017   TRIG 165.0 (H) 12/26/2017   CHOLHDL 3 12/26/2017   Lab Results  Component Value Date   ALT 18 12/26/2017   AST 21 12/26/2017   ALKPHOS 47 12/26/2017   BILITOT 0.3 12/26/2017    

## 2017-12-27 NOTE — Assessment & Plan Note (Signed)
Well controlled on current regimen. Renal function stable, no changes today.  Lab Results  Component Value Date   CREATININE 1.01 12/26/2017   Lab Results  Component Value Date   NA 139 12/26/2017   K 4.9 12/26/2017   CL 106 12/26/2017   CO2 26 12/26/2017

## 2018-01-03 ENCOUNTER — Other Ambulatory Visit: Payer: Self-pay | Admitting: Internal Medicine

## 2018-03-23 ENCOUNTER — Other Ambulatory Visit: Payer: Self-pay | Admitting: Internal Medicine

## 2018-03-23 MED ORDER — AMLODIPINE BESYLATE 10 MG PO TABS: 10.0000 mg | ORAL_TABLET | Freq: Every day | ORAL | 3 refills | Status: DC

## 2018-03-23 MED ORDER — TELMISARTAN 40 MG PO TABS: 40.0000 mg | ORAL_TABLET | Freq: Every day | ORAL | 5 refills | Status: DC

## 2018-03-23 NOTE — Telephone Encounter (Signed)
Amlodipine/benazepril discontinued .  mychart message sent as to why.  New meds sent

## 2018-03-23 NOTE — Telephone Encounter (Signed)
Refilled: 12/19/2017 Last OV: 12/26/2017 Next OV: 06/15/2018

## 2018-05-16 ENCOUNTER — Other Ambulatory Visit: Payer: Self-pay | Admitting: Internal Medicine

## 2018-06-15 ENCOUNTER — Other Ambulatory Visit: Payer: Self-pay | Admitting: Internal Medicine

## 2018-06-15 ENCOUNTER — Encounter: Payer: Self-pay | Admitting: Internal Medicine

## 2018-06-15 ENCOUNTER — Ambulatory Visit: Payer: Medicare Other

## 2018-06-15 ENCOUNTER — Other Ambulatory Visit: Payer: Self-pay

## 2018-06-15 ENCOUNTER — Ambulatory Visit (INDEPENDENT_AMBULATORY_CARE_PROVIDER_SITE_OTHER): Payer: Medicare Other | Admitting: Internal Medicine

## 2018-06-15 DIAGNOSIS — E1142 Type 2 diabetes mellitus with diabetic polyneuropathy: Secondary | ICD-10-CM | POA: Diagnosis not present

## 2018-06-15 DIAGNOSIS — G4733 Obstructive sleep apnea (adult) (pediatric): Secondary | ICD-10-CM

## 2018-06-15 DIAGNOSIS — E782 Mixed hyperlipidemia: Secondary | ICD-10-CM

## 2018-06-15 DIAGNOSIS — E1169 Type 2 diabetes mellitus with other specified complication: Secondary | ICD-10-CM

## 2018-06-15 DIAGNOSIS — I10 Essential (primary) hypertension: Secondary | ICD-10-CM

## 2018-06-15 MED ORDER — ROSUVASTATIN CALCIUM 40 MG PO TABS: 40.0000 mg | ORAL_TABLET | Freq: Every evening | ORAL | 1 refills | Status: DC

## 2018-06-15 NOTE — Progress Notes (Signed)
Virtual Visit converted to Telephone  Note   This visit type was conducted due to national recommendations for restrictions regarding the COVID-19 pandemic (e.g. social distancing).  This format is felt to be most appropriate for this patient at this time.  All issues noted in this document were discussed and addressed.  No physical exam was performed (except for noted visual exam findings with Video Visits).     I attempted to connect with@ on 06/14/18 at 10:30 AM EDT by a video enabled telemedicine application ; however  Interactive audio and video telecommunications  Ultimately failed, due to patient having technical difficulties.   We continued and completed visit with audio only. I and verified that I am speaking with the correct person using two identifiers.  Location patient: home Location provider: work or home office Persons participating in the virtual visit: patient, provider  I discussed the limitations, risks, security and privacy concerns of performing an evaluation and management service by telephone and the availability of in person appointments. I also discussed with the patient that there may be a patient responsible charge related to this service. The patient expressed understanding and agreed to proceed.  Reason for visit: 6 month follow up on t2dm, hyperlipidemia and hypertension   HPI:  78 yr old male with well controlled T2DM  with neuropathy /foot drop  , hyperlipidemia and hypertension OSA  The patient has no signs or symptoms of COVID 19 infection (fever, cough, sore throat  or shortness of breath beyond what is typical for patient).  Patient denies contact with other persons with the above mentioned symptoms or with anyone confirmed to have COVID 19 .  6 month follow up on diabetes.  Patient has no complaints today.  Patient is following a low glycemic index diet and taking all prescribed medications regularly without side effects.  Fasting sugars have been under  less than 140 most of the time and post prandials have been under 160 except on rare occasions. Patient is exercising about 3 times per week and intentionally trying to lose weight .  Patient has had an eye exam in the last 12 months and checks feet regularly for signs of infection.  Patient does not walk barefoot outside.  He has chronic numbness and foot drop secondary to diabetic neuropathy . Patient is up to date on all recommended vaccinations  OSA : Diagnosed by sleep study. Patient is wearing the autotitrating  CPAP apparatus every night a minimum of 6 hours per night and notes improved daytime wakefulness and decreased fatigue . Use of CPAP is medically necessary to use on a daily basis to prevent long term complications to patient's health, which include   heart failure,  Hypertension and increased risk of sudden death    ROS: Patient denies headache, fevers, malaise, unintentional weight loss, skin rash, eye pain, sinus congestion and sinus pain, sore throat, dysphagia,  hemoptysis , cough, dyspnea, wheezing, chest pain, palpitations, orthopnea, edema, abdominal pain, nausea, melena, diarrhea, constipation, flank pain, dysuria, hematuria, urinary  Frequency, nocturia, numbness, tingling, seizures,  Focal weakness, Loss of consciousness,  Tremor, insomnia, depression, anxiety, and suicidal ideation.      Past Medical History:  Diagnosis Date  . Diabetes mellitus    Type 2  . Hyperlipidemia   . Hypertension   . Peripheral neuropathy 01/2007   positive EMG studies, negative workup for causes  . Positive PPD, treated 1970    Past Surgical History:  Procedure Laterality Date  . COLONOSCOPY  2004,  2014   Dr Bary Castilla  . COLONOSCOPY WITH PROPOFOL N/A 04/30/2015   Procedure: COLONOSCOPY WITH PROPOFOL;  Surgeon: Robert Bellow, MD;  Location: [facility] ENDOSCOPY;  Service: Endoscopy;  Laterality: N/A;  . ROTATOR CUFF REPAIR  2005   right (Dr. Francia Greaves)  . TONSILLECTOMY    . WRIST FRACTURE  SURGERY Left    Armour, Ted     Family History  Problem Relation Age of Onset  . Heart attack Father   . Dementia Sister   . Kidney disease Sister   . Cancer Neg Hx     SOCIAL HX:  Social History   Tobacco Use  . Smoking status: Former Smoker    Types: Cigarettes    Last attempt to quit: 10/18/1980    Years since quitting: 37.6  . Smokeless tobacco: Former Systems developer    Types: [place] date: 10/19/1990  Substance Use Topics  . Alcohol use: Never    Frequency: Never  . Drug use: No     Current Outpatient Medications:  .  amLODipine (NORVASC) 10 MG tablet, Take 1 tablet (10 mg total) by mouth daily., Disp: 90 tablet, Rfl: 3 .  aspirin 81 MG tablet, Take 81 mg by mouth daily.  , Disp: , Rfl:  .  B-D 3CC LUER-LOK SYR 25GX1" 25G X 1" 3 ML MISC, USE AS DIRECTED FOR B12 INJECTIONS, Disp: 6 each, Rfl: 0 .  Choline Fenofibrate (FENOFIBRIC ACID) 135 MG CPDR, TAKE 1 CAPSULE BY MOUTH EVERY DAY, Disp: 90 capsule, Rfl: 1 .  co-enzyme Q-10 30 MG capsule, Take 30 mg by mouth daily.  , Disp: , Rfl:  .  cyanocobalamin (,VITAMIN B-12,) 1000 MCG/ML injection, INJECT 1ML INTO THE MUSCLE EVERY 30 DAYS, Disp: 10 mL, Rfl: 1 .  fish oil-omega-3 fatty acids 1000 MG capsule, Take 2 g by mouth daily.  , Disp: , Rfl:  .  metFORMIN (GLUCOPHAGE) 1000 MG tablet, TAKE 1 TABLET BY MOUTH TWICE DAILY WITH A MEAL, Disp: 180 tablet, Rfl: 1 .  Multiple Vitamin (MULTIVITAMIN) tablet, Take 1 tablet by mouth daily.  , Disp: , Rfl:  .  NEEDLE, DISP, 25 G 25G X 1" MISC, 1 application by Does not apply route every 30 (thirty) days., Disp: 6 each, Rfl: 0 .  rosuvastatin (CRESTOR) 40 MG tablet, Take 1 tablet (40 mg total) by mouth every evening., Disp: 90 tablet, Rfl: 1 .  telmisartan (MICARDIS) 40 MG tablet, Take 1 tablet (40 mg total) by mouth at bedtime., Disp: 30 tablet, Rfl: 5 .  glipiZIDE (GLUCOTROL) 5 MG tablet, TAKE ONE-HALF TABLET TWICE DAILY WITH MEALS, Disp: 90 tablet, Rfl: 1  EXAM:  General impression:  alert, cooperative and articulate.  No signs of being in distress  Lungs: speech is fluent sentence length suggests that patient is not short of breath and not punctuated by cough, sneezing or sniffing. Marland Kitchen   Psych: affect normal.  speech is articulate and non pressured .  Denies suicidal thoughts    ASSESSMENT AND PLAN:  Discussed the following assessment and plan:  Type 2 DM with diabetic neuropathy affecting both sides of body ([place]) - Plan: Hemoglobin A1c, Comprehensive metabolic panel  OSA (obstructive sleep apnea)  Essential hypertension  Mixed hyperlipidemia due to type 2 diabetes mellitus (HCC)  OSA (obstructive sleep apnea) Diagnosed by sleep study. he is wearing her CPAP every night a minimum of 6 hours per night and notes improved daytime wakefulness and decreased fatigue   Hypertension Patient is taking  his medications as prescribed and notes no adverse effects.  Home BP readings have been done about once per week and are  generally < 130/80 .  he is avoiding added salt in her diet and walking regularly about 3 times per week for exercise  .  Type 2 DM with diabetic neuropathy affecting both sides of body  Historically well-controlled on current doses of metformin and glipizide.  Repeat assessment is due.   . Patient is up-to-date on eye exams  Patient has no proteinuria , Is taking a baby aspirin,  tolerating statin and fenofibrate therapy for CAD risk reduction and an ACE/ARB . His longstanding neuropathy is now managed with leg braces to prevent foot drop .  He has d/c'd gabapentin .   Lab Results  Component Value Date   HGBA1C 6.8 (H) 12/26/2017   Lab Results  Component Value Date   MICROALBUR 2.6 (H) 12/26/2017     Mixed hyperlipidemia due to type 2 diabetes mellitus (HCC) LDL and triglycerides are controlled with rosuvastatin and fenofibrate, and he is taking a baby aspirin daily.  He has no side effects and liver enzymes are normal. No changes today   Lab  Results  Component Value Date   CHOL 138 12/26/2017   HDL 42.80 12/26/2017   LDLCALC 62 12/26/2017   LDLDIRECT 71.0 04/25/2017   TRIG 165.0 (H) 12/26/2017   CHOLHDL 3 12/26/2017   Lab Results  Component Value Date   ALT 18 12/26/2017   AST 21 12/26/2017   ALKPHOS 47 12/26/2017   BILITOT 0.3 12/26/2017       I discussed the assessment and treatment plan with the patient. The patient was provided an opportunity to ask questions and all were answered. The patient agreed with the plan and demonstrated an understanding of the instructions.   The patient was advised to call back or seek an in-person evaluation if the symptoms worsen or if the condition fails to improve as anticipated.  I provided 25 minutes of non-face-to-face time during this encounter.   Crecencio Mc, MD

## 2018-06-17 NOTE — Assessment & Plan Note (Signed)
Historically well-controlled on current doses of metformin and glipizide.  Repeat assessment is due.   . Patient is up-to-date on eye exams  Patient has no proteinuria , Is taking a baby aspirin,  tolerating statin and fenofibrate therapy for CAD risk reduction and an ACE/ARB . His longstanding neuropathy is now managed with leg braces to prevent foot drop .  He has d/c'd gabapentin .   Lab Results  Component Value Date   HGBA1C 6.8 (H) 12/26/2017   Lab Results  Component Value Date   MICROALBUR 2.6 (H) 12/26/2017

## 2018-06-17 NOTE — Assessment & Plan Note (Signed)
LDL and triglycerides are controlled with rosuvastatin and fenofibrate, and he is taking a baby aspirin daily.  He has no side effects and liver enzymes are normal. No changes today   Lab Results  Component Value Date   CHOL 138 12/26/2017   HDL 42.80 12/26/2017   LDLCALC 62 12/26/2017   LDLDIRECT 71.0 04/25/2017   TRIG 165.0 (H) 12/26/2017   CHOLHDL 3 12/26/2017   Lab Results  Component Value Date   ALT 18 12/26/2017   AST 21 12/26/2017   ALKPHOS 47 12/26/2017   BILITOT 0.3 12/26/2017

## 2018-06-17 NOTE — Assessment & Plan Note (Signed)
Patient is taking his medications as prescribed and notes no adverse effects.  Home BP readings have been done about once per week and are  generally < 130/80 .  he is avoiding added salt in her diet and walking regularly about 3 times per week for exercise  .

## 2018-06-17 NOTE — Assessment & Plan Note (Signed)
Diagnosed by sleep study. he is wearing her CPAP every night a minimum of 6 hours per night and notes improved daytime wakefulness and decreased fatigue  

## 2018-06-22 ENCOUNTER — Telehealth: Payer: Self-pay | Admitting: *Deleted

## 2018-06-22 NOTE — Telephone Encounter (Signed)
Copied from [id number]. Topic: General - Other >> Jun 22, 2018 10:15 AM Rainey Pines A wrote: Patient would like a callback from [place] in regards to bloodwork.

## 2018-06-27 ENCOUNTER — Other Ambulatory Visit: Payer: Self-pay

## 2018-06-27 ENCOUNTER — Other Ambulatory Visit (INDEPENDENT_AMBULATORY_CARE_PROVIDER_SITE_OTHER): Payer: Medicare Other

## 2018-06-27 DIAGNOSIS — E1142 Type 2 diabetes mellitus with diabetic polyneuropathy: Secondary | ICD-10-CM

## 2018-06-27 LAB — COMPREHENSIVE METABOLIC PANEL
ALT: 17 U/L (ref 0–53)
AST: 18 U/L (ref 0–37)
Albumin: 4.2 g/dL (ref 3.5–5.2)
Alkaline Phosphatase: 45 U/L (ref 39–117)
BUN: 15 mg/dL (ref 6–23)
CO2: 24 mEq/L (ref 19–32)
Calcium: 9.4 mg/dL (ref 8.4–10.5)
Chloride: 105 mEq/L (ref 96–112)
Creatinine, Ser: 1.01 mg/dL (ref 0.40–1.50)
GFR: 71.79 mL/min (ref >60.00)
Glucose, Bld: 130 mg/dL — ABNORMAL HIGH (ref 70–99)
Potassium: 4.2 mEq/L (ref 3.5–5.1)
Sodium: 139 mEq/L (ref 135–145)
Total Bilirubin: 0.4 mg/dL (ref 0.2–1.2)
Total Protein: 6.4 g/dL (ref 6.0–8.3)

## 2018-06-27 LAB — HEMOGLOBIN A1C: Hgb A1c MFr Bld: 7.2 % — ABNORMAL HIGH (ref 4.6–6.5)

## 2018-06-27 NOTE — Telephone Encounter (Signed)
Spoke with pt and he stated that he came in this morning for his blood work.

## 2018-06-28 ENCOUNTER — Ambulatory Visit: Payer: Medicare Other | Admitting: Internal Medicine

## 2018-07-13 ENCOUNTER — Other Ambulatory Visit: Payer: Self-pay | Admitting: Internal Medicine

## 2018-07-13 NOTE — Telephone Encounter (Signed)
Medication: metFORMIN (GLUCOPHAGE) 1000 MG tablet [[id number]]   Has the patient contacted their pharmacy? Yes  (Agent: If no, request that the patient contact the pharmacy for the refill.) (Agent: If yes, when and what did the pharmacy advise?)  Preferred Pharmacy (with phone number or street name): [place], [place] - [address] [phone] (Phone) [phone] (Fax)    Agent: Please be advised that RX refills may take up to 3 business days. We ask that you follow-up with your pharmacy.

## 2018-07-14 MED ORDER — METFORMIN HCL 1000 MG PO TABS: ORAL_TABLET | ORAL | 1 refills | Status: DC

## 2018-07-14 NOTE — Telephone Encounter (Signed)
Refill sent.

## 2018-08-15 ENCOUNTER — Other Ambulatory Visit: Payer: Self-pay | Admitting: Internal Medicine

## 2018-08-24 ENCOUNTER — Encounter: Payer: Self-pay | Admitting: General Surgery

## 2018-09-15 ENCOUNTER — Other Ambulatory Visit: Payer: Self-pay

## 2018-09-15 ENCOUNTER — Ambulatory Visit (INDEPENDENT_AMBULATORY_CARE_PROVIDER_SITE_OTHER): Payer: Medicare Other

## 2018-09-15 DIAGNOSIS — Z23 Encounter for immunization: Secondary | ICD-10-CM

## 2018-09-27 LAB — HM DIABETES EYE EXAM

## 2018-10-18 ENCOUNTER — Other Ambulatory Visit: Payer: Self-pay | Admitting: Internal Medicine

## 2018-11-21 ENCOUNTER — Other Ambulatory Visit: Payer: Self-pay | Admitting: Internal Medicine

## 2018-12-25 ENCOUNTER — Other Ambulatory Visit: Payer: Self-pay | Admitting: Internal Medicine

## 2019-01-10 ENCOUNTER — Other Ambulatory Visit: Payer: Self-pay | Admitting: Internal Medicine

## 2019-03-06 DIAGNOSIS — Z85820 Personal history of malignant melanoma of skin: Secondary | ICD-10-CM | POA: Diagnosis not present

## 2019-03-06 DIAGNOSIS — L570 Actinic keratosis: Secondary | ICD-10-CM | POA: Diagnosis not present

## 2019-03-06 DIAGNOSIS — X32XXXA Exposure to sunlight, initial encounter: Secondary | ICD-10-CM | POA: Diagnosis not present

## 2019-03-06 DIAGNOSIS — D2272 Melanocytic nevi of left lower limb, including hip: Secondary | ICD-10-CM | POA: Diagnosis not present

## 2019-03-06 DIAGNOSIS — L821 Other seborrheic keratosis: Secondary | ICD-10-CM | POA: Diagnosis not present

## 2019-03-06 DIAGNOSIS — Z85828 Personal history of other malignant neoplasm of skin: Secondary | ICD-10-CM | POA: Diagnosis not present

## 2019-03-06 DIAGNOSIS — D225 Melanocytic nevi of trunk: Secondary | ICD-10-CM | POA: Diagnosis not present

## 2019-03-06 DIAGNOSIS — D2262 Melanocytic nevi of left upper limb, including shoulder: Secondary | ICD-10-CM | POA: Diagnosis not present

## 2019-03-20 ENCOUNTER — Other Ambulatory Visit: Payer: Self-pay | Admitting: Internal Medicine

## 2019-05-22 ENCOUNTER — Other Ambulatory Visit: Payer: Self-pay | Admitting: Internal Medicine

## 2019-06-07 ENCOUNTER — Ambulatory Visit (INDEPENDENT_AMBULATORY_CARE_PROVIDER_SITE_OTHER): Payer: Medicare PPO

## 2019-06-07 VITALS — Ht 70.0 in | Wt 205.0 lb

## 2019-06-07 DIAGNOSIS — Z0000 Encounter for general adult medical examination without abnormal findings: Secondary | ICD-10-CM | POA: Diagnosis not present

## 2019-06-07 NOTE — Patient Instructions (Addendum)
  [name] [name] , Thank you for taking time to come for your Medicare Wellness Visit. I appreciate your ongoing commitment to your health goals. Please review the following plan we discussed and let me know if I can assist you in the future.   These are the goals we discussed: Goals    . DIET - INCREASE WATER INTAKE     Less caffeine intake    . Healthy Lifestyle     Stay active Healthy diet       This is a list of the screening recommended for you and due dates:  Health Maintenance  Topic Date Due  . Complete foot exam   01/24/2018  . Hemoglobin A1C  12/27/2018  . Flu Shot  08/19/2019  . Eye exam for diabetics  09/27/2019  . Colon Cancer Screening  04/29/2020  . Tetanus Vaccine  06/28/2020  . COVID-19 Vaccine  Completed  . Pneumonia vaccines  Completed

## 2019-06-07 NOTE — Progress Notes (Addendum)
Subjective:   [name] [name] is a 79 y.o. male who presents for Medicare Annual/Subsequent preventive examination.  Review of Systems:  No ROS.  Medicare Wellness Virtual Visit.  Visual/audio telehealth visit, UTA vital signs.   Ht/Wt provided.  See social history for additional risk factors.  Cardiac Risk Factors include: advanced age (>78men, >38 women);male gender;hypertension;diabetes mellitus     Objective:    Vitals: Ht 5\' 10"  (1.778 m)   Wt 205 lb (93 kg)   BMI 29.41 kg/m   Body mass index is 29.41 kg/m.  Advanced Directives 06/07/2019 06/10/2017 06/09/2016 06/10/2015  Does Patient Have a Medical Advance Directive? Yes Yes Yes Yes  Type of Paramedic of [place];Living [name] [name] [name]  Does patient want to make changes to medical advance directive? No - Patient declined No - Patient declined No - Patient declined -  Copy of [place] in Chart? No - copy requested No - copy requested No - copy requested No - copy requested    Tobacco Social History   Tobacco Use  Smoking Status Former Smoker  . Types: Cigarettes  . Quit date: 10/18/1980  . Years since quitting: 38.6  Smokeless Tobacco Former Systems developer  . Types: Chew  . Quit date: 10/19/1990     Counseling given: Not Answered   Clinical Intake:  Pre-visit preparation completed: Yes        Diabetes: Yes(Followed by pcp)  How often do you need to have someone help you when you read instructions, pamphlets, or other written materials from your doctor or pharmacy?: 1 - Never  Interpreter Needed?: No     Past Medical History:  Diagnosis Date  . Diabetes mellitus    Type 2  . Hyperlipidemia   . Hypertension   . Peripheral neuropathy 01/2007   positive EMG studies, negative workup for causes  . Positive PPD, treated 1970   Past Surgical History:  Procedure  Laterality Date  . COLONOSCOPY  2004, 2014   Dr Bary Castilla  . COLONOSCOPY WITH PROPOFOL N/A 04/30/2015   Procedure: COLONOSCOPY WITH PROPOFOL;  Surgeon: Robert Bellow, MD;  Location: [facility] ENDOSCOPY;  Service: Endoscopy;  Laterality: N/A;  . ROTATOR CUFF REPAIR  2005   right (Dr. Francia Greaves)  . TONSILLECTOMY    . WRIST FRACTURE SURGERY Left    [name] [name]    Family History  Problem Relation Age of Onset  . Heart attack Father   . Dementia Sister   . Kidney disease Sister   . Cancer Neg Hx    Social History   Socioeconomic History  . Marital status: Married    Spouse name: Not on file  . Number of children: Not on file  . Years of education: Not on file  . Highest education level: Not on file  Occupational History  . Not on file  Tobacco Use  . Smoking status: Former Smoker    Types: Cigarettes    Quit date: 10/18/1980    Years since quitting: 38.6  . Smokeless tobacco: Former Systems developer    Types: [place] date: 10/19/1990  Substance and Sexual Activity  . Alcohol use: Never  . Drug use: No  . Sexual activity: Not Currently  Other Topics Concern  . Not on file  Social History Narrative  . Not on file   Social Determinants of Health   Financial Resource Strain:   .  Difficulty of Paying Living Expenses:   Food Insecurity:   . Worried About Charity fundraiser in the Last Year:   . Arboriculturist in the Last Year:   Transportation Needs:   . Film/video editor (Medical):   Marland Kitchen Lack of Transportation (Non-Medical):   Physical Activity:   . Days of Exercise per Week:   . Minutes of Exercise per Session:   Stress:   . Feeling of Stress :   Social Connections:   . Frequency of Communication with Friends and Family:   . Frequency of Social Gatherings with Friends and Family:   . Attends Religious Services:   . Active Member of Clubs or Organizations:   . Attends Archivist Meetings:   Marland Kitchen Marital Status:     Outpatient Encounter Medications as of  06/07/2019  Medication Sig  . amLODipine (NORVASC) 10 MG tablet TAKE ONE TABLET EVERY DAY  . aspirin 81 MG tablet Take 81 mg by mouth daily.    . B-D 3CC LUER-LOK SYR 25GX1" 25G X 1" 3 ML MISC USE AS DIRECTED FOR B12 INJECTIONS  . Choline Fenofibrate (FENOFIBRIC ACID) 135 MG CPDR TAKE 1 CAPSULE EVERY DAY  . co-enzyme Q-10 30 MG capsule Take 30 mg by mouth daily.    . cyanocobalamin (,VITAMIN B-12,) 1000 MCG/ML injection INJECT 1ML INTO THE MUSCLE EVERY 30 DAYS  . fish oil-omega-3 fatty acids 1000 MG capsule Take 2 g by mouth daily.    Marland Kitchen glipiZIDE (GLUCOTROL) 5 MG tablet TAKE ONE-HALF TABLET TWICE DAILY WITH MEALS  . metFORMIN (GLUCOPHAGE) 1000 MG tablet TAKE ONE TABLET TWICE DAILY WITH A MEAL  . Multiple Vitamin (MULTIVITAMIN) tablet Take 1 tablet by mouth daily.    Marland Kitchen NEEDLE, DISP, 25 G [id number] X 1" MISC 1 application by Does not apply route every 30 (thirty) days.  . rosuvastatin (CRESTOR) 40 MG tablet TAKE ONE TABLET EVERY EVENING  . telmisartan (MICARDIS) 40 MG tablet TAKE ONE TABLET AT BEDTIME   No facility-administered encounter medications on file as of 06/07/2019.    Activities of Daily Living In your present state of health, do you have any difficulty performing the following activities: 06/07/2019  Hearing? N  Vision? N  Difficulty concentrating or making decisions? N  Walking or climbing stairs? N  Dressing or bathing? N  Doing errands, shopping? N  Preparing Food and eating ? N  Using the Toilet? N  In the past six months, have you accidently leaked urine? N  Do you have problems with loss of bowel control? N  Managing your Medications? N  Managing your Finances? N  Housekeeping or managing your Housekeeping? N  Some recent data might be hidden    Patient Care Team: Crecencio Mc, MD as PCP - General (Internal Medicine) Bary Castilla, Forest Gleason, MD (General Surgery) Crecencio Mc, MD (Internal Medicine)   Assessment:   This is a routine wellness examination for  [name] [name].  Nurse connected with patient 06/07/19 at  1:00 PM EDT by a telephone enabled telemedicine application, patient does not have access to video or has difficulty with video and verified that I am speaking with the correct person using two identifiers. Patient stated full name and DOB. Patient gave permission to continue with virtual visit. Patient's location was at home and Nurse's location was at [place] office.   Patient is alert and oriented x3. Patient denies difficulty focusing or concentrating.  Health Maintenance Due: -Foot Exam- followed by pcp -Hgb A1c-  06/27/18 (7.2) See completed HM at the end of note.   Eye: Visual acuity not assessed. Virtual visit. Followed by their ophthalmologist.  Retinopathy- none reported.  Dental: UTD  Safety:  Patient feels safe at home- yes Patient does have smoke detectors at home- yes Patient does wear sunscreen or protective clothing when in direct sunlight - yes Patient does wear seat belt when in a moving vehicle - yes Patient drives- yes Adequate lighting in walkways free from debris- yes Grab bars and handrails used as appropriate- yes Ambulates with an assistive device- yes; braces worn on each foot, cane in use as needed.  Social: Alcohol intake - no Smoking history- former  Smokers in home? none Illicit drug use? none  Medication: Taking as directed and without issues.  Self managed - yes   Covid-19: Precautions and sickness symptoms discussed. Wears mask, social distancing, hand hygiene as appropriate.   Activities of Daily Living Patient denies needing assistance with: household chores, feeding themselves, getting from bed to chair, getting to the toilet, bathing/showering, dressing, managing money, or preparing meals.   Discussed the importance of a healthy diet, water intake and the benefits of aerobic exercise.   Physical activity- gym exercises:legs, upper body exercises, treadmill 3 times weekly, 60 minutes.    Diet:  Diabetic  Water: fair. Encouraged to drink more water and stay hydrated.  Caffeine: 12 cups of coffee. Encouraged patient to drink less caffeine.   Other Providers Patient Care Team: Crecencio Mc, MD as PCP - General (Internal Medicine) Bary Castilla Forest Gleason, MD (General Surgery) Crecencio Mc, MD (Internal Medicine) Exercise Activities and Dietary recommendations Current Exercise Habits: Home exercise routine, Type of exercise: treadmill;stretching;strength training/weights, Time (Minutes): 60, Frequency (Times/Week): 3, Weekly Exercise (Minutes/Week): 180, Intensity: Mild  Goals    . DIET - INCREASE WATER INTAKE     Less caffeine intake    . Healthy Lifestyle     Stay active Healthy diet       Fall Risk Fall Risk  06/07/2019 06/10/2017 06/09/2016 09/17/2015 06/13/2015  Falls in the past year? 0 No No No No  Risk for fall due to : - - - - -  Follow up Falls evaluation completed - - - -   Timed Get Up and Go Performed: no, virtual visit  Depression Screen PHQ 2/9 Scores 06/07/2019 06/10/2017 06/09/2016 09/17/2015  PHQ - 2 Score 0 0 0 0  PHQ- 9 Score - - 0 -    Cognitive Function MMSE - Mini Mental State Exam 06/09/2016 06/10/2015  Orientation to time 5 5  Orientation to Place 5 5  Registration 3 3  Attention/ Calculation 5 5  Recall 3 3  Language- name 2 objects 2 2  Language- repeat 1 1  Language- follow 3 step command 3 3  Language- read & follow direction 1 1  Write a sentence 1 1  Copy design 1 1  Total score 30 30     6CIT Screen 06/07/2019 06/10/2017  What Year? 0 points 0 points  What month? 0 points 0 points  What time? 0 points 0 points  Count back from 20 0 points 0 points  Months in reverse 0 points 0 points  Repeat phrase 0 points 0 points  Total Score 0 0    Immunization History  Administered Date(s) Administered  . Fluad Quad(high Dose 65+) 09/15/2018  . Influenza Split 09/29/2011  . Influenza, High Dose Seasonal PF 11/11/2014,  09/17/2015, 10/22/2016, 11/03/2017  . Influenza,inj,Quad PF,6+ Mos  10/31/2012, 10/13/2013  . Moderna SARS-COVID-2 Vaccination 01/23/2019, 02/27/2019  . Pneumococcal Conjugate-13 04/16/2013  . Pneumococcal Polysaccharide-23 07/15/2010, 03/14/2015  . Tdap 06/29/2010  . Zoster 05/11/2010  . Zoster Recombinat (Shingrix) 09/14/2017, 11/22/2017   Screening Tests Health Maintenance  Topic Date Due  . FOOT EXAM  01/24/2018  . HEMOGLOBIN A1C  12/27/2018  . INFLUENZA VACCINE  08/19/2019  . OPHTHALMOLOGY EXAM  09/27/2019  . COLONOSCOPY  04/29/2020  . TETANUS/TDAP  06/28/2020  . COVID-19 Vaccine  Completed  . PNA vac Low Risk Adult  Completed      Plan:  Keep all routine maintenance appointments.   Medicare Attestation I have personally reviewed: The patient's medical and social history Their use of alcohol, tobacco or illicit drugs Their current medications and supplements The patient's functional ability including ADLs,fall risks, home safety risks, cognitive, and hearing and visual impairment Diet and physical activities Evidence for depression   I have reviewed and discussed with patient certain preventive protocols, quality metrics, and best practice recommendations.     OBrien-Blaney, Shuntia [place]on L, LPN  [phone]    I have reviewed the above information and agree with above.   Deborra Medina, MD

## 2019-06-12 ENCOUNTER — Other Ambulatory Visit: Payer: Self-pay | Admitting: Internal Medicine

## 2019-06-20 ENCOUNTER — Other Ambulatory Visit: Payer: Self-pay | Admitting: Internal Medicine

## 2019-07-09 ENCOUNTER — Other Ambulatory Visit: Payer: Self-pay | Admitting: Internal Medicine

## 2019-07-12 ENCOUNTER — Telehealth: Payer: Self-pay | Admitting: Internal Medicine

## 2019-07-12 NOTE — Telephone Encounter (Signed)
Spoke with pt and scheduled him for a follow up in July. Pt is aware of appt date and time.

## 2019-07-12 NOTE — Telephone Encounter (Signed)
LMTCB

## 2019-07-12 NOTE — Telephone Encounter (Signed)
Pt called in and stated that he picked up his prescription and it was 30 days and it should have been 90 days.

## 2019-07-24 ENCOUNTER — Ambulatory Visit: Payer: Medicare PPO | Admitting: Internal Medicine

## 2019-07-24 ENCOUNTER — Other Ambulatory Visit: Payer: Self-pay

## 2019-07-24 ENCOUNTER — Encounter: Payer: Self-pay | Admitting: Internal Medicine

## 2019-07-24 VITALS — BP 126/80 | HR 98 | Temp 98.0°F | Resp 16 | Ht 70.0 in | Wt 204.4 lb

## 2019-07-24 DIAGNOSIS — E1169 Type 2 diabetes mellitus with other specified complication: Secondary | ICD-10-CM | POA: Diagnosis not present

## 2019-07-24 DIAGNOSIS — I10 Essential (primary) hypertension: Secondary | ICD-10-CM | POA: Diagnosis not present

## 2019-07-24 DIAGNOSIS — E782 Mixed hyperlipidemia: Secondary | ICD-10-CM | POA: Diagnosis not present

## 2019-07-24 DIAGNOSIS — E1142 Type 2 diabetes mellitus with diabetic polyneuropathy: Secondary | ICD-10-CM

## 2019-07-24 LAB — POCT GLYCOSYLATED HEMOGLOBIN (HGB A1C): Hemoglobin A1C: 6.8 % — AB (ref 4.0–5.6)

## 2019-07-24 NOTE — Patient Instructions (Addendum)
Your diabetes remains under excellent control currently. .Please return in 6 months for follow up on diabetes and make sure you are seeing your eye doctor at least once a year.    Continue to practice safe distancing in public OR wear a mask if you are not sure who  You are around

## 2019-07-24 NOTE — Assessment & Plan Note (Signed)
LDL and triglycerides have been  controlled with rosuvastatin and fenofibrate, and he is taking a baby aspirin daily.  He has no side effects and liver enzymes are overdue

## 2019-07-24 NOTE — Assessment & Plan Note (Signed)
Well controlled on current regimen. Renal function stable, no changes today.  Lab Results  Component Value Date   CREATININE 1.01 06/27/2018   Lab Results  Component Value Date   NA 139 06/27/2018   K 4.2 06/27/2018   CL 105 06/27/2018   CO2 24 06/27/2018

## 2019-07-24 NOTE — Progress Notes (Signed)
Subjective:  Patient ID: [name] [name], male    DOB: [birth year]  Age: 79 y.o. MRN: [id number]  CC: The primary encounter diagnosis was Type 2 DM with diabetic neuropathy affecting both sides of body ([place]). Diagnoses of Mixed hyperlipidemia due to type 2 diabetes mellitus ([place]) and Essential hypertension were also pertinent to this visit.  HPI [name] [name] presents for follow up on Type 2 DM with sensoripolyneuropathy, hypertension, hyperlipidemia  This visit occurred during the SARS-CoV-2 public health emergency.  Safety protocols were in place, including screening questions prior to the visit, additional usage of staff PPE, and extensive cleaning of exam room while observing appropriate contact time as indicated for disinfecting solutions.     Patient does not check blood sugars more than once a month,  Last one was 135 a month ago in a fasting state.  Dos not recall any above 200 lr less than 80.  No complaints today.  Taking his medications as directed,  Walking  on a regular basis but  trying to lose weight.  Patient voices awareness  of the foods he/she needs to avoid,  And follows a low GI diet about 50% of the time.  Has not had an annual diabetic eye exam.  Denies numbness and tingling in lower extremities.  Denies hypoglycemic symptoms.   HTN:  Patient is taking his medications as prescribed and notes no adverse effects.  Home BP readings have been done about once per week and are  generally < 130/80. He is avoiding added salt in his diet and walking regularly about 3 times per week for exercise  .  Outpatient Medications Prior to Visit  Medication Sig Dispense Refill   amLODipine (NORVASC) 10 MG tablet TAKE ONE TABLET EVERY DAY 90 tablet 1   aspirin 81 MG tablet Take 81 mg by mouth daily.       B-D 3CC LUER-LOK SYR 25GX1" 25G X 1" 3 ML MISC USE AS DIRECTED FOR B12 INJECTIONS 6 each 0   Choline Fenofibrate (FENOFIBRIC ACID) 135 MG CPDR TAKE 1 CAPSULE EVERY DAY 90 capsule 1    co-enzyme Q-10 30 MG capsule Take 30 mg by mouth daily.       cyanocobalamin (,VITAMIN B-12,) 1000 MCG/ML injection INJECT 1 ML INTO THE MUSCLE EVERY 30 DAYS AS DIRECTED 10 mL 1   fish oil-omega-3 fatty acids 1000 MG capsule Take 2 g by mouth daily.       glipiZIDE (GLUCOTROL) 5 MG tablet TAKE ONE-HALF TABLET TWICE DAILY WITH MEALS 90 tablet 1   metFORMIN (GLUCOPHAGE) 1000 MG tablet TAKE ONE TABLET TWICE DAILY WITH A MEAL 60 tablet 0   Multiple Vitamin (MULTIVITAMIN) tablet Take 1 tablet by mouth daily.       NEEDLE, DISP, 25 G 78G X 1" MISC 1 application by Does not apply route every 30 (thirty) days. 6 each 0   rosuvastatin (CRESTOR) 40 MG tablet TAKE ONE TABLET EVERY EVENING 90 tablet 1   telmisartan (MICARDIS) 40 MG tablet TAKE ONE TABLET AT BEDTIME 90 tablet 1   No facility-administered medications prior to visit.    Review of Systems;  Patient denies headache, fevers, malaise, unintentional weight loss, skin rash, eye pain, sinus congestion and sinus pain, sore throat, dysphagia,  hemoptysis , cough, dyspnea, wheezing, chest pain, palpitations, orthopnea, edema, abdominal pain, nausea, melena, diarrhea, constipation, flank pain, dysuria, hematuria, urinary  Frequency, nocturia, numbness, tingling, seizures,  Focal weakness, Loss of consciousness,  Tremor, insomnia, depression, anxiety, and suicidal  ideation.      Objective:  BP 126/80 (BP Location: Left Arm, Patient Position: Sitting, Cuff Size: Normal)    Pulse 98    Temp 98 F (36.7 C) (Temporal)    Resp 16    Ht 5\' 10"  (1.778 m)    Wt 204 lb 6.4 oz (92.7 kg)    SpO2 96%    BMI 29.33 kg/m   BP Readings from Last 3 Encounters:  07/24/19 126/80  12/26/17 120/74  08/25/17 112/70    Wt Readings from Last 3 Encounters:  07/24/19 204 lb 6.4 oz (92.7 kg)  06/07/19 205 lb (93 kg)  12/26/17 203 lb (92.1 kg)    General appearance: alert, cooperative and appears stated age Ears: normal TM's and external ear canals both  ears Throat: lips, mucosa, and tongue normal; teeth and gums normal Neck: no adenopathy, no carotid bruit, supple, symmetrical, trachea midline and thyroid not enlarged, symmetric, no tenderness/mass/nodules Back: symmetric, no curvature. ROM normal. No CVA tenderness. Lungs: clear to auscultation bilaterally Heart: regular rate and rhythm, S1, S2 normal, no murmur, click, rub or gallop Abdomen: soft, non-tender; bowel sounds normal; no masses,  no organomegaly Pulses: 2+ and symmetric Skin: Skin color, texture, turgor normal. No rashes or lesions Lymph nodes: Cervical, supraclavicular, and axillary nodes normal.  Lab Results  Component Value Date   HGBA1C 6.8 (A) 07/24/2019   HGBA1C 7.2 (H) 06/27/2018   HGBA1C 6.8 (H) 12/26/2017    Lab Results  Component Value Date   CREATININE 1.01 06/27/2018   CREATININE 1.01 12/26/2017   CREATININE 1.12 08/25/2017    Lab Results  Component Value Date   GLUCOSE 130 (H) 06/27/2018   CHOL 138 12/26/2017   TRIG 165.0 (H) 12/26/2017   HDL 42.80 12/26/2017   LDLDIRECT 71.0 04/25/2017   LDLCALC 62 12/26/2017   ALT 17 06/27/2018   AST 18 06/27/2018   NA 139 06/27/2018   K 4.2 06/27/2018   CL 105 06/27/2018   CREATININE 1.01 06/27/2018   BUN 15 06/27/2018   CO2 24 06/27/2018   TSH 0.69 01/24/2017   PSA 1.61 08/25/2017   HGBA1C 6.8 (A) 07/24/2019   MICROALBUR 2.6 (H) 12/26/2017    No results found.  Assessment & Plan:   Problem List Items Addressed This Visit      Unprioritized   Type 2 DM with diabetic neuropathy affecting both sides of body ([place]) - Primary    well-controlled on current doses of metformin and glipizide. Patient is up-to-date on eye exams  Patient has no proteinuria , Is taking a baby aspirin,  tolerating statin and fenofibrate therapy for CAD risk reduction and an ACE/ARB . His longstanding neuropathy is now managed with leg braces to manage  foot drop .  He has d/c'd gabapentin .   Lab Results  Component Value  Date   HGBA1C 6.8 (A) 07/24/2019   Lab Results  Component Value Date   MICROALBUR 2.6 (H) 12/26/2017         Relevant Orders   Lipid panel   Microalbumin / creatinine urine ratio   Comprehensive metabolic panel   POCT HgB A1C (Completed)   Mixed hyperlipidemia due to type 2 diabetes mellitus (HCC)    LDL and triglycerides have been  controlled with rosuvastatin and fenofibrate, and he is taking a baby aspirin daily.  He has no side effects and liver enzymes are overdue        Hypertension    Well controlled on current regimen. Renal  function stable, no changes today.  Lab Results  Component Value Date   CREATININE 1.01 06/27/2018   Lab Results  Component Value Date   NA 139 06/27/2018   K 4.2 06/27/2018   CL 105 06/27/2018   CO2 24 06/27/2018            I am having Dontrail C. Wetmore "Clair Gulling" maintain his aspirin, co-enzyme Q-10, fish oil-omega-3 fatty acids, multivitamin, NEEDLE (DISP) 25 G, rosuvastatin, Fenofibric Acid, glipiZIDE, amLODipine, telmisartan, cyanocobalamin, B-D 3CC LUER-LOK SYR 25GX1", and metFORMIN.  No orders of the defined types were placed in this encounter.   There are no discontinued medications.  Follow-up: No follow-ups on file.   Crecencio Mc, MD

## 2019-07-24 NOTE — Assessment & Plan Note (Signed)
well-controlled on current doses of metformin and glipizide. Patient is up-to-date on eye exams  Patient has no proteinuria , Is taking a baby aspirin,  tolerating statin and fenofibrate therapy for CAD risk reduction and an ACE/ARB . His longstanding neuropathy is now managed with leg braces to manage  foot drop .  He has d/c'd gabapentin .   Lab Results  Component Value Date   HGBA1C 6.8 (A) 07/24/2019   Lab Results  Component Value Date   MICROALBUR 2.6 (H) 12/26/2017

## 2019-07-25 LAB — MICROALBUMIN / CREATININE URINE RATIO
Creatinine,U: 75 mg/dL
Microalb Creat Ratio: 1 mg/g (ref 0.0–30.0)
Microalb, Ur: 0.8 mg/dL (ref 0.0–1.9)

## 2019-07-25 LAB — COMPREHENSIVE METABOLIC PANEL
ALT: 19 U/L (ref 0–53)
AST: 19 U/L (ref 0–37)
Albumin: 4.4 g/dL (ref 3.5–5.2)
Alkaline Phosphatase: 57 U/L (ref 39–117)
BUN: 15 mg/dL (ref 6–23)
CO2: 26 mEq/L (ref 19–32)
Calcium: 10 mg/dL (ref 8.4–10.5)
Chloride: 103 mEq/L (ref 96–112)
Creatinine, Ser: 0.99 mg/dL (ref 0.40–1.50)
GFR: 73.26 mL/min (ref >60.00)
Glucose, Bld: 120 mg/dL — ABNORMAL HIGH (ref 70–99)
Potassium: 5.1 mEq/L (ref 3.5–5.1)
Sodium: 138 mEq/L (ref 135–145)
Total Bilirubin: 0.3 mg/dL (ref 0.2–1.2)
Total Protein: 6.8 g/dL (ref 6.0–8.3)

## 2019-07-25 LAB — LIPID PANEL
Cholesterol: 162 mg/dL (ref 0–200)
HDL: 42.6 mg/dL (ref >39.00)
LDL Cholesterol: 83 mg/dL (ref 0–99)
NonHDL: 119.26
Total CHOL/HDL Ratio: 4
Triglycerides: 180 mg/dL — ABNORMAL HIGH (ref 0.0–149.0)
VLDL: 36 mg/dL (ref 0.0–40.0)

## 2019-08-20 ENCOUNTER — Other Ambulatory Visit: Payer: Self-pay | Admitting: Internal Medicine

## 2019-09-03 DIAGNOSIS — L821 Other seborrheic keratosis: Secondary | ICD-10-CM | POA: Diagnosis not present

## 2019-09-03 DIAGNOSIS — X32XXXA Exposure to sunlight, initial encounter: Secondary | ICD-10-CM | POA: Diagnosis not present

## 2019-09-03 DIAGNOSIS — Z85828 Personal history of other malignant neoplasm of skin: Secondary | ICD-10-CM | POA: Diagnosis not present

## 2019-09-03 DIAGNOSIS — D2262 Melanocytic nevi of left upper limb, including shoulder: Secondary | ICD-10-CM | POA: Diagnosis not present

## 2019-09-03 DIAGNOSIS — D225 Melanocytic nevi of trunk: Secondary | ICD-10-CM | POA: Diagnosis not present

## 2019-09-03 DIAGNOSIS — L570 Actinic keratosis: Secondary | ICD-10-CM | POA: Diagnosis not present

## 2019-09-03 DIAGNOSIS — D2271 Melanocytic nevi of right lower limb, including hip: Secondary | ICD-10-CM | POA: Diagnosis not present

## 2019-09-03 DIAGNOSIS — Z85820 Personal history of malignant melanoma of skin: Secondary | ICD-10-CM | POA: Diagnosis not present

## 2019-09-06 ENCOUNTER — Other Ambulatory Visit: Payer: Self-pay | Admitting: Internal Medicine

## 2019-09-10 DIAGNOSIS — M5442 Lumbago with sciatica, left side: Secondary | ICD-10-CM | POA: Diagnosis not present

## 2019-09-10 DIAGNOSIS — M9903 Segmental and somatic dysfunction of lumbar region: Secondary | ICD-10-CM | POA: Diagnosis not present

## 2019-09-10 DIAGNOSIS — M5127 Other intervertebral disc displacement, lumbosacral region: Secondary | ICD-10-CM | POA: Diagnosis not present

## 2019-09-13 DIAGNOSIS — M5127 Other intervertebral disc displacement, lumbosacral region: Secondary | ICD-10-CM | POA: Diagnosis not present

## 2019-09-13 DIAGNOSIS — M9903 Segmental and somatic dysfunction of lumbar region: Secondary | ICD-10-CM | POA: Diagnosis not present

## 2019-09-13 DIAGNOSIS — M5442 Lumbago with sciatica, left side: Secondary | ICD-10-CM | POA: Diagnosis not present

## 2019-09-17 DIAGNOSIS — M9903 Segmental and somatic dysfunction of lumbar region: Secondary | ICD-10-CM | POA: Diagnosis not present

## 2019-09-17 DIAGNOSIS — M5127 Other intervertebral disc displacement, lumbosacral region: Secondary | ICD-10-CM | POA: Diagnosis not present

## 2019-09-17 DIAGNOSIS — M5442 Lumbago with sciatica, left side: Secondary | ICD-10-CM | POA: Diagnosis not present

## 2019-09-27 ENCOUNTER — Telehealth: Payer: Self-pay | Admitting: Internal Medicine

## 2019-09-27 DIAGNOSIS — M5442 Lumbago with sciatica, left side: Secondary | ICD-10-CM | POA: Diagnosis not present

## 2019-09-27 DIAGNOSIS — M5127 Other intervertebral disc displacement, lumbosacral region: Secondary | ICD-10-CM | POA: Diagnosis not present

## 2019-09-27 DIAGNOSIS — M9903 Segmental and somatic dysfunction of lumbar region: Secondary | ICD-10-CM | POA: Diagnosis not present

## 2019-09-27 NOTE — Telephone Encounter (Signed)
Spoke with pt and he stated that he has not seen Dr. Derrel Nip for this so he was advised that he would need to schedule an appt with her first. Pt is scheduled for next Wednesday. Pt is aware of appt date and time.

## 2019-09-27 NOTE — Telephone Encounter (Signed)
Pt would like to have a referral for MRI on his back. Pt has been seeing chiropractor who suggested the MRI. Pt has had pain for about three weeks-going down into legs. He would like the MRI to be local. Please advise

## 2019-10-03 ENCOUNTER — Telehealth (INDEPENDENT_AMBULATORY_CARE_PROVIDER_SITE_OTHER): Payer: Medicare PPO | Admitting: Internal Medicine

## 2019-10-03 ENCOUNTER — Other Ambulatory Visit: Payer: Self-pay

## 2019-10-03 VITALS — Ht 70.0 in | Wt 206.0 lb

## 2019-10-03 DIAGNOSIS — M5441 Lumbago with sciatica, right side: Secondary | ICD-10-CM | POA: Diagnosis not present

## 2019-10-03 DIAGNOSIS — M5442 Lumbago with sciatica, left side: Secondary | ICD-10-CM | POA: Diagnosis not present

## 2019-10-03 DIAGNOSIS — E1142 Type 2 diabetes mellitus with diabetic polyneuropathy: Secondary | ICD-10-CM

## 2019-10-03 DIAGNOSIS — M545 Low back pain, unspecified: Secondary | ICD-10-CM | POA: Insufficient documentation

## 2019-10-03 DIAGNOSIS — M5387 Other specified dorsopathies, lumbosacral region: Secondary | ICD-10-CM

## 2019-10-03 NOTE — Progress Notes (Signed)
Telephone Note  This visit type was conducted due to national recommendations for restrictions regarding the COVID-19 pandemic (e.g. social distancing).  This format is felt to be most appropriate for this patient at this time.  All issues noted in this document were discussed and addressed.  No physical exam was performed (except for noted visual exam findings with Video Visits).   I connected with@ on 10/03/19 at  4:30 PM EDT by telephone and verified that I am speaking with the correct person using two identifiers. Location patient: home Location provider: work or home office Persons participating in the virtual visit: patient, provider  I discussed the limitations, risks, security and privacy concerns of performing an evaluation and management service by telephone and the availability of in person appointments. I also discussed with the patient that there may be a patient responsible charge related to this service. The patient expressed understanding and agreed to proceed.  Reason for visit: back pain  HPI:  79 yr old with acute on chronic low back pain. Reports worsening pain over the last  6 weeks  despite regular manipulation by a chiropractor .the pain starts in the  Midline of the lower spine, radiates to left hip,  Then  radiates down both legs to the feet with supine position,  And Improves with sitting in a recliner . The pain is described as  Burning sensation.  No saddle parasthesias.  No recent unusual activity,  No recent falls.     History of 2 herniated disks , remote MRI done at  [facility] in 2009 reviewed no disk herniation,  But he states he was given an ESI by  Orthopedics Dr Oleta Mouse with excellent resolution of pain     ROS: See pertinent positives and negatives per HPI.  Past Medical History:  Diagnosis Date  . Diabetes mellitus    Type 2  . Hyperlipidemia   . Hypertension   . Peripheral neuropathy 01/2007   positive EMG studies, negative workup for causes  . Positive  PPD, treated 1970    Past Surgical History:  Procedure Laterality Date  . COLONOSCOPY  2004, 2014   Dr Bary Castilla  . COLONOSCOPY WITH PROPOFOL N/A 04/30/2015   Procedure: COLONOSCOPY WITH PROPOFOL;  Surgeon: Robert Bellow, MD;  Location: [facility] ENDOSCOPY;  Service: Endoscopy;  Laterality: N/A;  . ROTATOR CUFF REPAIR  2005   right (Dr. Francia Greaves)  . TONSILLECTOMY    . WRIST FRACTURE SURGERY Left    [name], [name]     Family History  Problem Relation Age of Onset  . Heart attack Father   . Dementia Sister   . Kidney disease Sister   . Cancer Neg Hx     SOCIAL HX:  reports that he quit smoking about 38 years ago. His smoking use included cigarettes. He quit smokeless tobacco use about 28 years ago.  His smokeless tobacco use included chew. He reports that he does not drink alcohol and does not use drugs.  Current Outpatient Medications:  .  amLODipine (NORVASC) 10 MG tablet, TAKE ONE TABLET EVERY DAY, Disp: 90 tablet, Rfl: 1 .  aspirin 81 MG tablet, Take 81 mg by mouth daily.  , Disp: , Rfl:  .  B-D 3CC LUER-LOK SYR 25GX1" 25G X 1" 3 ML MISC, USE AS DIRECTED FOR B12 INJECTIONS, Disp: 6 each, Rfl: 0 .  Choline Fenofibrate (FENOFIBRIC ACID) 135 MG CPDR, TAKE 1 CAPSULE EVERY DAY, Disp: 90 capsule, Rfl: 1 .  co-enzyme Q-10 30 MG capsule,  Take 30 mg by mouth daily.  , Disp: , Rfl:  .  cyanocobalamin (,VITAMIN B-12,) 1000 MCG/ML injection, INJECT 1 ML INTO THE MUSCLE EVERY 30 DAYS AS DIRECTED, Disp: 10 mL, Rfl: 1 .  fish oil-omega-3 fatty acids 1000 MG capsule, Take 2 g by mouth daily.  , Disp: , Rfl:  .  glipiZIDE (GLUCOTROL) 5 MG tablet, TAKE ONE-HALF TABLET TWICE DAILY WITH MEALS, Disp: 90 tablet, Rfl: 1 .  metFORMIN (GLUCOPHAGE) 1000 MG tablet, TAKE ONE TABLET TWICE A DAY WITH MEALS, Disp: 60 tablet, Rfl: 0 .  Multiple Vitamin (MULTIVITAMIN) tablet, Take 1 tablet by mouth daily.  , Disp: , Rfl:  .  NEEDLE, DISP, 25 G 25G X 1" MISC, 1 application by Does not apply route every 30 (thirty)  days., Disp: 6 each, Rfl: 0 .  rosuvastatin (CRESTOR) 40 MG tablet, TAKE ONE TABLET EVERY EVENING, Disp: 90 tablet, Rfl: 1 .  telmisartan (MICARDIS) 40 MG tablet, TAKE ONE TABLET AT BEDTIME, Disp: 90 tablet, Rfl: 1  EXAM:   General impression: alert, cooperative and articulate.  No signs of being in distress  Lungs: speech is fluent sentence length suggests that patient is not short of breath and not punctuated by cough, sneezing or sniffing. Marland Kitchen   Psych: affect normal.  speech is articulate and non pressured .  Denies suicidal thoughts   ASSESSMENT AND PLAN:  Discussed the following assessment and plan:  Acute left-sided low back pain with bilateral sciatica - Plan: MR Lumbar Spine Wo Contrast  Type 2 DM with diabetic neuropathy affecting both sides of body (HCC)  Sciatica associated with disorder of lumbosacral spine  Sciatica associated with disorder of lumbosacral spine Symptoms have been present for 6 weeks and are not improving with chiropractic manipulation .  He has a history of disk herniation x 2 remotely with no history of surgery.  MRI ordered  And referral to Dr Loistine Chance to follow     I discussed the assessment and treatment plan with the patient. The patient was provided an opportunity to ask questions and all were answered. The patient agreed with the plan and demonstrated an understanding of the instructions.   The patient was advised to call back or seek an in-person evaluation if the symptoms worsen or if the condition fails to improve as anticipated.  I provided 22 minutes of non-face-to-face time during this encounter.   Crecencio Mc, MD

## 2019-10-04 DIAGNOSIS — M5387 Other specified dorsopathies, lumbosacral region: Secondary | ICD-10-CM | POA: Insufficient documentation

## 2019-10-04 DIAGNOSIS — E119 Type 2 diabetes mellitus without complications: Secondary | ICD-10-CM | POA: Diagnosis not present

## 2019-10-04 LAB — HM DIABETES EYE EXAM

## 2019-10-04 NOTE — Assessment & Plan Note (Deleted)
well-controlled on current doses of metformin and glipizide. Patient is up-to-date on eye exams  Patient has no proteinuria , Is taking a baby aspirin,  tolerating statin and fenofibrate therapy for CAD risk reduction and an ACE/ARB . His longstanding neuropathy is now managed with leg braces to manage  foot drop .  He has d/c'd gabapentin .   Lab Results  Component Value Date   HGBA1C 6.8 (A) 07/24/2019   Lab Results  Component Value Date   MICROALBUR 0.8 07/24/2019

## 2019-10-04 NOTE — Assessment & Plan Note (Addendum)
Symptoms have been present for 6 weeks and are not improving with chiropractic manipulation .  He has a history of disk herniation x 2 remotely with no history of surgery.  MRI ordered  And referral to Dr Loistine Chance to follow

## 2019-10-10 ENCOUNTER — Encounter: Payer: Self-pay | Admitting: Internal Medicine

## 2019-10-12 ENCOUNTER — Encounter: Payer: Self-pay | Admitting: Internal Medicine

## 2019-10-15 ENCOUNTER — Ambulatory Visit (INDEPENDENT_AMBULATORY_CARE_PROVIDER_SITE_OTHER): Payer: Medicare PPO

## 2019-10-15 ENCOUNTER — Other Ambulatory Visit: Payer: Self-pay

## 2019-10-15 DIAGNOSIS — Z23 Encounter for immunization: Secondary | ICD-10-CM | POA: Diagnosis not present

## 2019-10-16 ENCOUNTER — Other Ambulatory Visit: Payer: Self-pay | Admitting: Internal Medicine

## 2019-10-20 ENCOUNTER — Ambulatory Visit
Admission: RE | Admit: 2019-10-20 | Discharge: 2019-10-20 | Disposition: A | Discharge status: Home / Self Care | Payer: Medicare PPO | Source: Ambulatory Visit | Attending: Internal Medicine | Admitting: Internal Medicine

## 2019-10-20 DIAGNOSIS — M5442 Lumbago with sciatica, left side: Secondary | ICD-10-CM | POA: Diagnosis not present

## 2019-10-20 DIAGNOSIS — M5441 Lumbago with sciatica, right side: Secondary | ICD-10-CM | POA: Insufficient documentation

## 2019-10-20 DIAGNOSIS — M545 Low back pain, unspecified: Secondary | ICD-10-CM | POA: Diagnosis not present

## 2019-10-22 ENCOUNTER — Telehealth: Payer: Self-pay | Admitting: Internal Medicine

## 2019-10-22 DIAGNOSIS — G608 Other hereditary and idiopathic neuropathies: Secondary | ICD-10-CM | POA: Diagnosis not present

## 2019-10-22 DIAGNOSIS — M21371 Foot drop, right foot: Secondary | ICD-10-CM | POA: Diagnosis not present

## 2019-10-22 DIAGNOSIS — E538 Deficiency of other specified B group vitamins: Secondary | ICD-10-CM | POA: Diagnosis not present

## 2019-10-22 DIAGNOSIS — M5387 Other specified dorsopathies, lumbosacral region: Secondary | ICD-10-CM

## 2019-10-22 DIAGNOSIS — M21372 Foot drop, left foot: Secondary | ICD-10-CM | POA: Diagnosis not present

## 2019-10-22 NOTE — Telephone Encounter (Signed)
Pt is aware that referral has been placed. Pt was also advised that if he does not hear anything in a week to give [place] a call so we can check on the referral. Pt gave a verbal understanding.

## 2019-10-22 NOTE — Telephone Encounter (Signed)
Referral in process,  MRI received.

## 2019-10-22 NOTE — Telephone Encounter (Signed)
Patient was returning call for MRI

## 2019-10-22 NOTE — Telephone Encounter (Signed)
Have you received the MRI results yet?

## 2019-10-22 NOTE — Telephone Encounter (Signed)
LMTCB

## 2019-10-22 NOTE — Telephone Encounter (Signed)
Patient called in stated that he had a MRI Sunday the hospital is sending the MRI to Dr. Derrel Nip and now need a  referral to [facility] for Dr.G Liu if possible.

## 2019-11-15 DIAGNOSIS — M21372 Foot drop, left foot: Secondary | ICD-10-CM | POA: Diagnosis not present

## 2019-11-15 DIAGNOSIS — M5116 Intervertebral disc disorders with radiculopathy, lumbar region: Secondary | ICD-10-CM | POA: Insufficient documentation

## 2019-11-15 DIAGNOSIS — M21371 Foot drop, right foot: Secondary | ICD-10-CM | POA: Diagnosis not present

## 2019-11-20 ENCOUNTER — Other Ambulatory Visit: Payer: Self-pay | Admitting: Internal Medicine

## 2019-11-29 DIAGNOSIS — M5116 Intervertebral disc disorders with radiculopathy, lumbar region: Secondary | ICD-10-CM | POA: Diagnosis not present

## 2019-12-24 ENCOUNTER — Other Ambulatory Visit: Payer: Self-pay | Admitting: Internal Medicine

## 2020-01-21 ENCOUNTER — Other Ambulatory Visit: Payer: Self-pay | Admitting: Internal Medicine

## 2020-01-24 ENCOUNTER — Ambulatory Visit: Payer: Medicare PPO | Admitting: Internal Medicine

## 2020-02-06 ENCOUNTER — Other Ambulatory Visit: Payer: Self-pay

## 2020-02-11 ENCOUNTER — Ambulatory Visit: Payer: Medicare PPO | Admitting: Internal Medicine

## 2020-02-11 ENCOUNTER — Encounter: Payer: Self-pay | Admitting: Internal Medicine

## 2020-02-11 ENCOUNTER — Other Ambulatory Visit: Payer: Self-pay

## 2020-02-11 VITALS — BP 122/68 | HR 103 | Temp 97.7°F | Resp 15 | Ht 70.0 in | Wt 199.1 lb

## 2020-02-11 DIAGNOSIS — E66811 Obesity, class 1: Secondary | ICD-10-CM

## 2020-02-11 DIAGNOSIS — E782 Mixed hyperlipidemia: Secondary | ICD-10-CM | POA: Diagnosis not present

## 2020-02-11 DIAGNOSIS — E1169 Type 2 diabetes mellitus with other specified complication: Secondary | ICD-10-CM | POA: Diagnosis not present

## 2020-02-11 DIAGNOSIS — E669 Obesity, unspecified: Secondary | ICD-10-CM

## 2020-02-11 DIAGNOSIS — M5387 Other specified dorsopathies, lumbosacral region: Secondary | ICD-10-CM

## 2020-02-11 DIAGNOSIS — E1142 Type 2 diabetes mellitus with diabetic polyneuropathy: Secondary | ICD-10-CM | POA: Diagnosis not present

## 2020-02-11 DIAGNOSIS — E538 Deficiency of other specified B group vitamins: Secondary | ICD-10-CM | POA: Diagnosis not present

## 2020-02-11 LAB — COMPREHENSIVE METABOLIC PANEL
ALT: 16 U/L (ref 0–53)
AST: 16 U/L (ref 0–37)
Albumin: 4.4 g/dL (ref 3.5–5.2)
Alkaline Phosphatase: 45 U/L (ref 39–117)
BUN: 22 mg/dL (ref 6–23)
CO2: 27 mEq/L (ref 19–32)
Calcium: 10.5 mg/dL (ref 8.4–10.5)
Chloride: 106 mEq/L (ref 96–112)
Creatinine, Ser: 1.03 mg/dL (ref 0.40–1.50)
GFR: 69.93 mL/min (ref >60.00)
Glucose, Bld: 109 mg/dL — ABNORMAL HIGH (ref 70–99)
Potassium: 4.7 mEq/L (ref 3.5–5.1)
Sodium: 140 mEq/L (ref 135–145)
Total Bilirubin: 0.4 mg/dL (ref 0.2–1.2)
Total Protein: 6.6 g/dL (ref 6.0–8.3)

## 2020-02-11 LAB — LIPID PANEL
Cholesterol: 120 mg/dL (ref 0–200)
HDL: 39.4 mg/dL (ref >39.00)
LDL Cholesterol: 55 mg/dL (ref 0–99)
NonHDL: 80.35
Total CHOL/HDL Ratio: 3
Triglycerides: 129 mg/dL (ref 0.0–149.0)
VLDL: 25.8 mg/dL (ref 0.0–40.0)

## 2020-02-11 LAB — VITAMIN B12: Vitamin B-12: 334 pg/mL (ref 211–911)

## 2020-02-11 LAB — HEMOGLOBIN A1C: Hgb A1c MFr Bld: 7.1 % — ABNORMAL HIGH (ref 4.6–6.5)

## 2020-02-11 MED ORDER — AMLODIPINE BESYLATE 10 MG PO TABS: 10.0000 mg | ORAL_TABLET | Freq: Every day | ORAL | 1 refills | Status: DC

## 2020-02-11 NOTE — Patient Instructions (Signed)
I recommend that you get the COVID BOOSTER now since it has been over 6 months    Diabetes Mellitus and Sick Day Management Blood sugar (glucose) can be difficult to control when you are sick. Common illnesses that can cause problems for people with diabetes (diabetes mellitus) include colds, fever, flu (influenza), nausea, vomiting, and diarrhea. These illnesses can cause stress and loss of body fluids (dehydration), and those issues can cause blood glucose levels to increase. Because of this, it is very important to take your insulin and diabetes medicines and eat some form of carbohydrate when you are sick. You should make a plan for days when you are sick (sick day plan) as part of your diabetes management plan. You and your health care provider should make this plan in advance. The following guidelines are intended to help you manage an illness that lasts for about 24 hours or less. Your health care provider may also give you more specific instructions. How to manage your blood glucose  Check your blood glucose every 2-4 hours, or as often as told by your health care provider.  If you use insulin, take your usual dose. If your blood glucose continues to be too high, you may need to take an additional insulin dose as told by your health care provider.  Know your sick day treatment goals. Your target blood glucose levels may be different when you are sick.  If you use oral diabetes medicine, continue to take your medicines. Have a plan with your health care provider for these medicines while you are sick.  If you use injectable hormone medicines other than insulin to control your diabetes, have a plan with your health care provider for these medicines while you are sick.   Follow these instructions at home Check your ketones  If you have type 1 diabetes, check your urine ketones every 4 hours.  If you have type 2 diabetes, check your urine ketones as often as told by your health care  provider. Eating and drinking  Drink enough fluid to keep your urine pale yellow. This is especially important if you have a fever, vomiting, or diarrhea. Those symptoms can lead to dehydration.  Follow instructions from your health care provider about beverages to avoid.  Do not drink alcohol, caffeine, or drinks that contain a lot of sugar.  You need to eat some form of carbohydrates when you are sick. Eat 45-50 grams (45-50 g) of carbohydrates every 3-4 hours until you feel better. All of the food choices below contain about 15 g of carbohydrates. Plan ahead and keep some of these foods around so you have them if you get sick. ? 4-6 oz (120-177 mL) carbonated beverage that contains sugar, such as regular (not diet) soda. You may be able to drink carbonated beverages more easily if you open the beverage and let it sit at room temperature for a few minutes before drinking. ?  of a twin frozen ice pop. ? 4 oz (120 g) regular gelatin. ? 4 oz (120 mL) fruit juice. ? 4 oz (120 g) ice cream or frozen yogurt. ? 2 oz (60 g) sherbet. ? 1 slice bread or toast. ? 6 saltine crackers. ? 5 vanilla wafers. Medicines  Take-over-the-counter and prescription medicines only as told by your health care provider.  Check medicine labels for added sugars. Some medicines may contain sugar or types of sugars that can raise your blood glucose level. Questions to ask your health care provider  Should I  adjust my diabetes medicines?  How often do I need to check my blood glucose?  What supplies do I need to manage my diabetes at home when I am sick?  What number can I call if I have questions?  What foods and drinks should I avoid? Contact a health care provider if:  You have been sick or have had a fever for 2 days or longer and you are not getting better.  Your blood glucose is at or above 240 mg/dl (13.3 mmol/L), even after you take an additional insulin dose.  You are unable to drink fluids  without vomiting.  You have any of the following for more than 6 hours: ? Nausea. ? Vomiting. ? Diarrhea. Get help right away if:  You have difficulty breathing.  You have moderate or high ketone levels in your urine.  You have a change in how you think, feel, or act (mental status).  You develop symptoms of diabetic ketoacidosis. These include: ? Nausea. ? Vomiting. ? Excessive thirst. ? Excessive urination. ? Fruity or sweet smelling breath. ? Rapid breathing. ? Pain in the abdomen.  Your blood glucose is lower than 54mg /dl (3.0 mmol/L).  You used emergency glucagon to treat low blood glucose. These symptoms may represent a serious problem that is an emergency. Do not wait to see if the symptoms will go away. Get medical help right away. Call your local emergency services (911 in the [place] Do not drive yourself to the hospital. Summary  Blood sugar (glucose) can be difficult to control when you are sick. Common illnesses that can cause problems for people with diabetes (diabetes mellitus) include colds, fever, flu (influenza), nausea, vomiting, and diarrhea.  Illnesses can cause stress and loss of body fluids (dehydration), and those issues can cause blood glucose levels to increase.  Make a plan for days when you are sick (sick day plan) as part of your diabetes management plan. You and your health care provider should make this plan in advance.  It is very important to take your insulin and diabetes medicines and to eat some form of carbohydrate when you are sick.  Contact your health care provider if have problems managing your blood glucose levels when you are sick, or if you have been sick or had a fever for 2 days or longer and are not getting better. This information is not intended to replace advice given to you by your health care provider. Make sure you discuss any questions you have with your health care provider. Document Revised: 01/25/2019 Document Reviewed:  01/25/2019 Elsevier Patient Education  2021 [employer].

## 2020-02-11 NOTE — Assessment & Plan Note (Signed)
I have addressed  BMI and recommended wt loss of 10% of body weigh over the next 6 months using a low glycemic index diet and regular exercise a minimum of 5 days per week.   

## 2020-02-11 NOTE — Assessment & Plan Note (Signed)
LDL and triglycerides have been  controlled with rosuvastatin and fenofibrate, and he is taking a baby aspirin daily.  He has no side effects and liver enzymes are due

## 2020-02-11 NOTE — Assessment & Plan Note (Signed)
S/p ESI in November with resolution of pain noted

## 2020-02-11 NOTE — Progress Notes (Signed)
Subjective:  Patient ID: [name] [name], male    DOB: Dec 17, 1940  Age: 80 y.o. MRN: [id number]  CC: The primary encounter diagnosis was Mixed hyperlipidemia due to type 2 diabetes mellitus ([place]). Diagnoses of Type 2 DM with diabetic neuropathy affecting both sides of body ([place]), B12 deficiency, Obesity (BMI 30.0-34.9), and Sciatica associated with disorder of lumbosacral spine were also pertinent to this visit.  HPI [name] [name] presents for 6 moth follow up on type2 DM with neuropathy,  Hypertension and hyperlipidemia  This visit occurred during the SARS-CoV-2 public health emergency.  Safety protocols were in place, including screening questions prior to the visit, additional usage of staff PPE, and extensive cleaning of exam room while observing appropriate contact time as indicated for disinfecting solutions.   Wife [name] [name] underwent cardioversion last week for atrial fib with RVR , presented with fatigue and LE edema   His sciatica has improved since he underwent ESI in November  . He continues to require use of braces for foot drop B12 deficiency:  Managed with monthly IM injections.      Outpatient Medications Prior to Visit  Medication Sig Dispense Refill  . aspirin 81 MG tablet Take 81 mg by mouth daily.    . B-D 3CC LUER-LOK SYR 25GX1" 25G X 1" 3 ML MISC USE AS DIRECTED FOR B12 INJECTIONS 6 each 0  . Choline Fenofibrate (FENOFIBRIC ACID) 135 MG CPDR TAKE 1 CAPSULE EVERY DAY 90 capsule 1  . co-enzyme Q-10 30 MG capsule Take 30 mg by mouth daily.    . cyanocobalamin (,VITAMIN B-12,) 1000 MCG/ML injection INJECT 1 ML INTO THE MUSCLE EVERY 30 DAYS AS DIRECTED 10 mL 1  . fish oil-omega-3 fatty acids 1000 MG capsule Take 2 g by mouth daily.    Marland Kitchen glipiZIDE (GLUCOTROL) 5 MG tablet TAKE ONE-HALF TABLET TWICE DAILY WITH MEALS 90 tablet 1  . metFORMIN (GLUCOPHAGE) 1000 MG tablet TAKE 1 TABLET BY MOUTH TWICE DAILY WITH MEALS. 60 tablet 0  . Multiple Vitamin (MULTIVITAMIN) tablet Take 1  tablet by mouth daily.    Marland Kitchen NEEDLE, DISP, 25 G 57Q X 1" MISC 1 application by Does not apply route every 30 (thirty) days. 6 each 0  . rosuvastatin (CRESTOR) 40 MG tablet TAKE ONE TABLET EVERY EVENING 90 tablet 1  . telmisartan (MICARDIS) 40 MG tablet TAKE ONE TABLET AT BEDTIME 90 tablet 1  . amLODipine (NORVASC) 10 MG tablet TAKE ONE TABLET EVERY DAY 90 tablet 1   No facility-administered medications prior to visit.    Review of Systems;  Patient denies headache, fevers, malaise, unintentional weight loss, skin rash, eye pain, sinus congestion and sinus pain, sore throat, dysphagia,  hemoptysis , cough, dyspnea, wheezing, chest pain, palpitations, orthopnea, edema, abdominal pain, nausea, melena, diarrhea, constipation, flank pain, dysuria, hematuria, urinary  Frequency, nocturia, numbness, tingling, seizures,  Focal weakness, Loss of consciousness,  Tremor, insomnia, depression, anxiety, and suicidal ideation.      Objective:  BP 122/68 (BP Location: Left Arm, Patient Position: Sitting, Cuff Size: Normal)   Pulse (!) 103   Temp 97.7 F (36.5 C) (Oral)   Resp 15   Ht 5\' 10"  (1.778 m)   Wt 199 lb 1.9 oz (90.3 kg)   SpO2 95%   BMI 28.57 kg/m   BP Readings from Last 3 Encounters:  02/11/20 122/68  07/24/19 126/80  12/26/17 120/74    Wt Readings from Last 3 Encounters:  02/11/20 199 lb 1.9 oz (90.3 kg)  10/03/19 206 lb (93.4 kg)  07/24/19 204 lb 6.4 oz (92.7 kg)    General appearance: alert, cooperative and appears stated age Ears: normal TM's and external ear canals both ears Throat: lips, mucosa, and tongue normal; teeth and gums normal Neck: no adenopathy, no carotid bruit, supple, symmetrical, trachea midline and thyroid not enlarged, symmetric, no tenderness/mass/nodules Back: symmetric, no curvature. ROM normal. No CVA tenderness. Lungs: clear to auscultation bilaterally Heart: regular rate and rhythm, S1, S2 normal, no murmur, click, rub or gallop Abdomen: soft,  non-tender; bowel sounds normal; no masses,  no organomegaly Pulses: 2+ and symmetric Skin: Skin color, texture, turgor normal. No rashes or lesions Lymph nodes: Cervical, supraclavicular, and axillary nodes normal.  Lab Results  Component Value Date   HGBA1C 6.8 (A) 07/24/2019   HGBA1C 7.2 (H) 06/27/2018   HGBA1C 6.8 (H) 12/26/2017    Lab Results  Component Value Date   CREATININE 0.99 07/24/2019   CREATININE 1.01 06/27/2018   CREATININE 1.01 12/26/2017    Lab Results  Component Value Date   GLUCOSE 120 (H) 07/24/2019   CHOL 162 07/24/2019   TRIG 180.0 (H) 07/24/2019   HDL 42.60 07/24/2019   LDLDIRECT 71.0 04/25/2017   LDLCALC 83 07/24/2019   ALT 19 07/24/2019   AST 19 07/24/2019   NA 138 07/24/2019   K 5.1 07/24/2019   CL 103 07/24/2019   CREATININE 0.99 07/24/2019   BUN 15 07/24/2019   CO2 26 07/24/2019   TSH 0.69 01/24/2017   PSA 1.61 08/25/2017   HGBA1C 6.8 (A) 07/24/2019   MICROALBUR 0.8 07/24/2019    MR Lumbar Spine Wo Contrast  Result Date: 10/21/2019 CLINICAL DATA:  Low back pain with right foot drop EXAM: MRI LUMBAR SPINE WITHOUT CONTRAST TECHNIQUE: Multiplanar, multisequence MR imaging of the lumbar spine was performed. No intravenous contrast was administered. COMPARISON:  03/15/2007 FINDINGS: Segmentation:  Standard Alignment:  Grade 1 retrolisthesis at L4-5 Vertebrae:  No fracture, evidence of discitis, or bone lesion. Conus medullaris and cauda equina: Conus extends to the L1 level. Conus and cauda equina appear normal. Paraspinal and other soft tissues: Negative Disc levels: T12-L1: Normal. T12-L1: Normal disc space and facets. No spinal canal or neuroforaminal stenosis. L1-L2: Normal disc space and facets. No spinal canal or neuroforaminal stenosis. L2-L3: New left subarticular disc protrusion superimposed on small disc bulge. Mild narrowing of the left lateral recess. No central spinal canal or neural foraminal stenosis. L3-L4: Mild disc bulge. Normal  facets. No spinal canal or neural foraminal stenosis. L4-L5: Worsened disc bulge, left asymmetric. Left-greater-than-right lateral recess stenosis. Mild left neural foraminal stenosis. L5-S1: Slightly worsened disc bulge with unchanged mild facet hypertrophy. There is right lateral recess stenosis with probable impingement of the right S1 nerve root. Moderate right foraminal stenosis has worsened. Visualized sacrum: Normal. IMPRESSION: 1. Worsening of degenerative disc disease at L5-S1 with right lateral recess stenosis and probable impingement of the right S1 nerve root. Correlate for right S1 radiculopathy. 2. Worsened disc bulge at L4-L5 with left-greater-than-right lateral recess stenosis and mild left neural foraminal stenosis. 3. New left subarticular disc protrusion at L2-L3 with mild narrowing of the left lateral recess. Correlate for left L3 radiculopathy. Electronically Signed   By: Ulyses Jarred M.D.   On: 10/21/2019 00:20    Assessment & Plan:   Problem List Items Addressed This Visit      Unprioritized   B12 deficiency   Relevant Orders   Vitamin B12   Mixed hyperlipidemia due to type  2 diabetes mellitus ([place]) - Primary    LDL and triglycerides have been  controlled with rosuvastatin and fenofibrate, and he is taking a baby aspirin daily.  He has no side effects and liver enzymes are due        Relevant Medications   amLODipine (NORVASC) 10 MG tablet   Other Relevant Orders   Lipid panel   Obesity (BMI 30.0-34.9)    I have addressed  BMI and recommended wt loss of 10% of body weigh over the next 6 months using a low glycemic index diet and regular exercise a minimum of 5 days per week.        Sciatica associated with disorder of lumbosacral spine    S/p ESI in November with resolution of pain noted       Type 2 DM with diabetic neuropathy affecting both sides of body ([place])    Historically well-controlled on current doses of metformin 100 mg bid and glipizide 5 mg bid .  Patient is up-to-date on eye exams  Patient has no proteinuria , Is taking a baby aspirin,  tolerating statin and fenofibrate therapy for CAD risk reduction and an ACE/ARB . His longstanding neuropathy is now managed with leg braces to manage  foot drop .  He has d/c'd gabapentin .   Lab Results  Component Value Date   HGBA1C 6.8 (A) 07/24/2019   Lab Results  Component Value Date   MICROALBUR 0.8 07/24/2019         Relevant Orders   Hemoglobin A1c   Comprehensive metabolic panel      I have changed Farren C. Cheeks "Jim"'s amLODipine. I am also having him maintain his aspirin, co-enzyme Q-10, fish oil-omega-3 fatty acids, multivitamin, NEEDLE (DISP) 25 G, glipiZIDE, telmisartan, cyanocobalamin, B-D 3CC LUER-LOK SYR 25GX1", Fenofibric Acid, rosuvastatin, and metFORMIN.  Meds ordered this encounter  Medications  . amLODipine (NORVASC) 10 MG tablet    Sig: Take 1 tablet (10 mg total) by mouth daily.    Dispense:  90 tablet    Refill:  1    FOR NEXT TIME    Medications Discontinued During This Encounter  Medication Reason  . amLODipine (NORVASC) 10 MG tablet Reorder    Follow-up: No follow-ups on file.   Crecencio Mc, MD

## 2020-02-11 NOTE — Assessment & Plan Note (Signed)
Historically well-controlled on current doses of metformin 100 mg bid and glipizide 5 mg bid . Patient is up-to-date on eye exams  Patient has no proteinuria , Is taking a baby aspirin,  tolerating statin and fenofibrate therapy for CAD risk reduction and an ACE/ARB . His longstanding neuropathy is now managed with leg braces to manage  foot drop .  He has d/c'd gabapentin .   Lab Results  Component Value Date   HGBA1C 6.8 (A) 07/24/2019   Lab Results  Component Value Date   MICROALBUR 0.8 07/24/2019

## 2020-02-12 NOTE — Progress Notes (Signed)
Your diabetes is still under good control on current regimen, but  a1c has risen compared to last time and is now 7.1  Your other labs are fine.   I recommend we hold off on adding any medications for now, and reassess in 3 months. If still elevated then,  we will discuss starting weekly Ozempic   Regards,   Deborra Medina, MD

## 2020-02-29 ENCOUNTER — Other Ambulatory Visit: Payer: Self-pay | Admitting: Internal Medicine

## 2020-03-05 DIAGNOSIS — D225 Melanocytic nevi of trunk: Secondary | ICD-10-CM | POA: Diagnosis not present

## 2020-03-05 DIAGNOSIS — X32XXXA Exposure to sunlight, initial encounter: Secondary | ICD-10-CM | POA: Diagnosis not present

## 2020-03-05 DIAGNOSIS — D485 Neoplasm of uncertain behavior of skin: Secondary | ICD-10-CM | POA: Diagnosis not present

## 2020-03-05 DIAGNOSIS — Z86006 Personal history of melanoma in-situ: Secondary | ICD-10-CM | POA: Diagnosis not present

## 2020-03-05 DIAGNOSIS — L821 Other seborrheic keratosis: Secondary | ICD-10-CM | POA: Diagnosis not present

## 2020-03-05 DIAGNOSIS — Z85828 Personal history of other malignant neoplasm of skin: Secondary | ICD-10-CM | POA: Diagnosis not present

## 2020-03-05 DIAGNOSIS — Z85820 Personal history of malignant melanoma of skin: Secondary | ICD-10-CM | POA: Diagnosis not present

## 2020-03-05 DIAGNOSIS — D1801 Hemangioma of skin and subcutaneous tissue: Secondary | ICD-10-CM | POA: Diagnosis not present

## 2020-03-05 DIAGNOSIS — C44622 Squamous cell carcinoma of skin of right upper limb, including shoulder: Secondary | ICD-10-CM | POA: Diagnosis not present

## 2020-03-05 DIAGNOSIS — L570 Actinic keratosis: Secondary | ICD-10-CM | POA: Diagnosis not present

## 2020-03-05 DIAGNOSIS — D2272 Melanocytic nevi of left lower limb, including hip: Secondary | ICD-10-CM | POA: Diagnosis not present

## 2020-03-14 DIAGNOSIS — M21371 Foot drop, right foot: Secondary | ICD-10-CM | POA: Diagnosis not present

## 2020-03-14 DIAGNOSIS — M5116 Intervertebral disc disorders with radiculopathy, lumbar region: Secondary | ICD-10-CM | POA: Diagnosis not present

## 2020-03-14 DIAGNOSIS — M21372 Foot drop, left foot: Secondary | ICD-10-CM | POA: Diagnosis not present

## 2020-03-18 ENCOUNTER — Other Ambulatory Visit: Payer: Self-pay | Admitting: Internal Medicine

## 2020-03-19 ENCOUNTER — Other Ambulatory Visit: Payer: Self-pay | Admitting: Internal Medicine

## 2020-03-25 ENCOUNTER — Other Ambulatory Visit: Payer: Self-pay | Admitting: Internal Medicine

## 2020-03-26 DIAGNOSIS — D2361 Other benign neoplasm of skin of right upper limb, including shoulder: Secondary | ICD-10-CM | POA: Diagnosis not present

## 2020-03-26 DIAGNOSIS — C44622 Squamous cell carcinoma of skin of right upper limb, including shoulder: Secondary | ICD-10-CM | POA: Diagnosis not present

## 2020-06-09 ENCOUNTER — Ambulatory Visit (INDEPENDENT_AMBULATORY_CARE_PROVIDER_SITE_OTHER): Payer: Medicare PPO

## 2020-06-09 VITALS — Ht 70.0 in | Wt 199.0 lb

## 2020-06-09 DIAGNOSIS — Z0000 Encounter for general adult medical examination without abnormal findings: Secondary | ICD-10-CM

## 2020-06-09 NOTE — Patient Instructions (Addendum)
[name] [name] , Thank you for taking time to come for your Medicare Wellness Visit. I appreciate your ongoing commitment to your health goals. Please review the following plan we discussed and let me know if I can assist you in the future.   These are the goals we discussed: Goals      Patient Stated   .  Maintain Healthy Lifestyle (pt-stated)      Stay active Healthy diet       This is a list of the screening recommended for you and due dates:  Health Maintenance  Topic Date Due  . Colon Cancer Screening  04/29/2020  . COVID-19 Vaccine (4 - Booster for Moderna series) 01/16/2021*  . Tetanus Vaccine  06/28/2020  . Hemoglobin A1C  08/10/2020  . Flu Shot  08/18/2020  . Eye exam for diabetics  10/03/2020  . Complete foot exam   02/10/2021  . Hepatitis C Screening: USPSTF Recommendation to screen - Ages 49-79 yo.  Completed  . Pneumonia vaccines  Completed  . HPV Vaccine  Aged Out  *Topic was postponed. The date shown is not the original due date.   Advanced directives: End of life planning; Advance aging; Advanced directives discussed.  Copy of current HCPOA/Living Will requested.    Conditions/risks identified: none new  Follow up in one year for your annual wellness visit.   Preventive Care 78 Years and Older, Male Preventive care refers to lifestyle choices and visits with your health care provider that can promote health and wellness. What does preventive care include?  A yearly physical exam. This is also called an annual well check.  Dental exams once or twice a year.  Routine eye exams. Ask your health care provider how often you should have your eyes checked.  Personal lifestyle choices, including:  Daily care of your teeth and gums.  Regular physical activity.  Eating a healthy diet.  Avoiding tobacco and drug use.  Limiting alcohol use.  Practicing safe sex.  Taking low doses of aspirin every day.  Taking vitamin and mineral supplements as recommended  by your health care provider. What happens during an annual well check? The services and screenings done by your health care provider during your annual well check will depend on your age, overall health, lifestyle risk factors, and family history of disease. Counseling  Your health care provider may ask you questions about your:  Alcohol use.  Tobacco use.  Drug use.  Emotional well-being.  Home and relationship well-being.  Sexual activity.  Eating habits.  History of falls.  Memory and ability to understand (cognition).  Work and work Statistician. Screening  You may have the following tests or measurements:  Height, weight, and BMI.  Blood pressure.  Lipid and cholesterol levels. These may be checked every 5 years, or more frequently if you are over 25 years old.  Skin check.  Lung cancer screening. You may have this screening every year starting at age 46 if you have a 30-pack-year history of smoking and currently smoke or have quit within the past 15 years.  Fecal occult blood test (FOBT) of the stool. You may have this test every year starting at age 13.  Flexible sigmoidoscopy or colonoscopy. You may have a sigmoidoscopy every 5 years or a colonoscopy every 10 years starting at age 82.  Prostate cancer screening. Recommendations will vary depending on your family history and other risks.  Hepatitis C blood test.  Hepatitis B blood test.  Sexually transmitted disease (STD)  testing.  Diabetes screening. This is done by checking your blood sugar (glucose) after you have not eaten for a while (fasting). You may have this done every 1-3 years.  Abdominal aortic aneurysm (AAA) screening. You may need this if you are a current or former smoker.  Osteoporosis. You may be screened starting at age 15 if you are at high risk. Talk with your health care provider about your test results, treatment options, and if necessary, the need for more tests. Vaccines  Your  health care provider may recommend certain vaccines, such as:  Influenza vaccine. This is recommended every year.  Tetanus, diphtheria, and acellular pertussis (Tdap, Td) vaccine. You may need a Td booster every 10 years.  Zoster vaccine. You may need this after age 7.  Pneumococcal 13-valent conjugate (PCV13) vaccine. One dose is recommended after age 39.  Pneumococcal polysaccharide (PPSV23) vaccine. One dose is recommended after age 40. Talk to your health care provider about which screenings and vaccines you need and how often you need them. This information is not intended to replace advice given to you by your health care provider. Make sure you discuss any questions you have with your health care provider. Document Released: 01/31/2015 Document Revised: 09/24/2015 Document Reviewed: 11/05/2014 Elsevier Interactive Patient Education  2017 [name] [name] Prevention in the Home Falls can cause injuries. They can happen to people of all ages. There are many things you can do to make your home safe and to help prevent falls. What can I do on the outside of my home?  Regularly fix the edges of walkways and driveways and fix any cracks.  Remove anything that might make you trip as you walk through a door, such as a raised step or threshold.  Trim any bushes or trees on the path to your home.  Use bright outdoor lighting.  Clear any walking paths of anything that might make someone trip, such as rocks or tools.  Regularly check to see if handrails are loose or broken. Make sure that both sides of any steps have handrails.  Any raised decks and porches should have guardrails on the edges.  Have any leaves, snow, or ice cleared regularly.  Use sand or salt on walking paths during winter.  Clean up any spills in your garage right away. This includes oil or grease spills. What can I do in the bathroom?  Use night lights.  Install grab bars by the toilet and in the tub and  shower. Do not use towel bars as grab bars.  Use non-skid mats or decals in the tub or shower.  If you need to sit down in the shower, use a plastic, non-slip stool.  Keep the floor dry. Clean up any water that spills on the floor as soon as it happens.  Remove soap buildup in the tub or shower regularly.  Attach bath mats securely with double-sided non-slip rug tape.  Do not have throw rugs and other things on the floor that can make you trip. What can I do in the bedroom?  Use night lights.  Make sure that you have a light by your bed that is easy to reach.  Do not use any sheets or blankets that are too big for your bed. They should not hang down onto the floor.  Have a firm chair that has side arms. You can use this for support while you get dressed.  Do not have throw rugs and other things on the floor  that can make you trip. What can I do in the kitchen?  Clean up any spills right away.  Avoid walking on wet floors.  Keep items that you use a lot in easy-to-reach places.  If you need to reach something above you, use a strong step stool that has a grab bar.  Keep electrical cords out of the way.  Do not use floor polish or wax that makes floors slippery. If you must use wax, use non-skid floor wax.  Do not have throw rugs and other things on the floor that can make you trip. What can I do with my stairs?  Do not leave any items on the stairs.  Make sure that there are handrails on both sides of the stairs and use them. Fix handrails that are broken or loose. Make sure that handrails are as long as the stairways.  Check any carpeting to make sure that it is firmly attached to the stairs. Fix any carpet that is loose or worn.  Avoid having throw rugs at the top or bottom of the stairs. If you do have throw rugs, attach them to the floor with carpet tape.  Make sure that you have a light switch at the top of the stairs and the bottom of the stairs. If you do not  have them, ask someone to add them for you. What else can I do to help prevent falls?  Wear shoes that:  Do not have high heels.  Have rubber bottoms.  Are comfortable and fit you well.  Are closed at the toe. Do not wear sandals.  If you use a stepladder:  Make sure that it is fully opened. Do not climb a closed stepladder.  Make sure that both sides of the stepladder are locked into place.  Ask someone to hold it for you, if possible.  Clearly mark and make sure that you can see:  Any grab bars or handrails.  First and last steps.  Where the edge of each step is.  Use tools that help you move around (mobility aids) if they are needed. These include:  Canes.  Walkers.  Scooters.  Crutches.  Turn on the lights when you go into a dark area. Replace any light bulbs as soon as they burn out.  Set up your furniture so you have a clear path. Avoid moving your furniture around.  If any of your floors are uneven, fix them.  If there are any pets around you, be aware of where they are.  Review your medicines with your doctor. Some medicines can make you feel dizzy. This can increase your chance of falling. Ask your doctor what other things that you can do to help prevent falls. This information is not intended to replace advice given to you by your health care provider. Make sure you discuss any questions you have with your health care provider. Document Released: 10/31/2008 Document Revised: 06/12/2015 Document Reviewed: 02/08/2014 Elsevier Interactive Patient Education  2017 [employer].

## 2020-06-09 NOTE — Progress Notes (Addendum)
Subjective:   [name] [name] is a 80 y.o. male who presents for Medicare Annual/Subsequent preventive examination.  Review of Systems    No ROS.  Medicare Wellness Virtual Visit.  Visual/audio telehealth visit, UTA vital signs.   See social history for additional risk factors.   Cardiac Risk Factors include: advanced age (>8men, >60 women);male gender;hypertension;diabetes mellitus     Objective:    Today's Vitals   06/09/20 1323  Weight: 199 lb (90.3 kg)  Height: 5\' 10"  (1.778 m)   Body mass index is 28.55 kg/m.  Advanced Directives 06/09/2020 06/07/2019 06/10/2017 06/09/2016 06/10/2015  Does Patient Have a Medical Advance Directive? Yes Yes Yes Yes Yes  Type of Paramedic of [place];Living will [place];Living will [place];Living will [place];Living will [place];Living will  Does patient want to make changes to medical advance directive? No - Patient declined No - Patient declined No - Patient declined No - Patient declined -  Copy of [place] in Chart? Yes - validated most recent copy scanned in chart (See row information) No - copy requested No - copy requested No - copy requested No - copy requested    Current Medications (verified) Outpatient Encounter Medications as of 06/09/2020  Medication Sig  . amLODipine (NORVASC) 10 MG tablet Take 1 tablet (10 mg total) by mouth daily.  Marland Kitchen aspirin 81 MG tablet Take 81 mg by mouth daily.  . B-D 3CC LUER-LOK SYR 25GX1" 25G X 1" 3 ML MISC USE AS DIRECTED FOR B12 INJECTIONS  . Choline Fenofibrate (FENOFIBRIC ACID) 135 MG CPDR TAKE 1 CAPSULE BY MOUTH EVERY DAY.  Marland Kitchen co-enzyme Q-10 30 MG capsule Take 30 mg by mouth daily.  . cyanocobalamin (,VITAMIN B-12,) 1000 MCG/ML injection INJECT 1 ML INTO THE MUSCLE EVERY 30 DAYS AS DIRECTED  . fish oil-omega-3 fatty acids 1000 MG capsule Take 2 g by mouth daily.  Marland Kitchen glipiZIDE  (GLUCOTROL) 5 MG tablet TAKE ONE-HALF TABLET TWICE DAILY WITH MEALS  . metFORMIN (GLUCOPHAGE) 1000 MG tablet TAKE 1 TABLET BY MOUTH TWICE DAILY WITH MEALS.  . Multiple Vitamin (MULTIVITAMIN) tablet Take 1 tablet by mouth daily.  Marland Kitchen NEEDLE, DISP, 25 G 31V X 1" MISC 1 application by Does not apply route every 30 (thirty) days.  . rosuvastatin (CRESTOR) 40 MG tablet TAKE 1 TABLET BY MOUTH EVERY EVENING.  Marland Kitchen telmisartan (MICARDIS) 40 MG tablet TAKE ONE TABLET AT BEDTIME   No facility-administered encounter medications on file as of 06/09/2020.    Allergies (verified) Neosporin [neomycin-bacitracin zn-polymyx] and Polysporin [bacitracin-polymyxin b]   History: Past Medical History:  Diagnosis Date  . Diabetes mellitus    Type 2  . Hyperlipidemia   . Hypertension   . Peripheral neuropathy 01/2007   positive EMG studies, negative workup for causes  . Positive PPD, treated 1970   Past Surgical History:  Procedure Laterality Date  . COLONOSCOPY  2004, 2014   Dr Bary Castilla  . COLONOSCOPY WITH PROPOFOL N/A 04/30/2015   Procedure: COLONOSCOPY WITH PROPOFOL;  Surgeon: Robert Bellow, MD;  Location: [facility] ENDOSCOPY;  Service: Endoscopy;  Laterality: N/A;  . ROTATOR CUFF REPAIR  2005   right (Dr. Francia Greaves)  . TONSILLECTOMY    . WRIST FRACTURE SURGERY Left    Armour, Ted    Family History  Problem Relation Age of Onset  . Heart attack Father   . Dementia Sister   . Kidney disease Sister   .  Cancer Neg Hx    Social History   Socioeconomic History  . Marital status: Married    Spouse name: Not on file  . Number of children: Not on file  . Years of education: Not on file  . Highest education level: Not on file  Occupational History  . Not on file  Tobacco Use  . Smoking status: Former Smoker    Types: Cigarettes    Quit date: 10/18/1980    Years since quitting: 39.6  . Smokeless tobacco: Former Systems developer    Types: [place] date: 10/19/1990  Substance and Sexual Activity  . Alcohol  use: Never  . Drug use: No  . Sexual activity: Not Currently  Other Topics Concern  . Not on file  Social History Narrative  . Not on file   Social Determinants of Health   Financial Resource Strain: Low Risk   . Difficulty of Paying Living Expenses: Not hard at all  Food Insecurity: No Food Insecurity  . Worried About Charity fundraiser in the Last Year: Never true  . Ran Out of Food in the Last Year: Never true  Transportation Needs: No Transportation Needs  . Lack of Transportation (Medical): No  . Lack of Transportation (Non-Medical): No  Physical Activity: Sufficiently Active  . Days of Exercise per Week: 3 days  . Minutes of Exercise per Session: 60 min  Stress: No Stress Concern Present  . Feeling of Stress : Not at all  Social Connections: Unknown  . Frequency of Communication with Friends and Family: Not on file  . Frequency of Social Gatherings with Friends and Family: Not on file  . Attends Religious Services: Not on file  . Active Member of Clubs or Organizations: Not on file  . Attends Archivist Meetings: Not on file  . Marital Status: Married    Tobacco Counseling Counseling given: Not Answered   Clinical Intake:  Pre-visit preparation completed: Yes        Diabetes: Yes (Followed by pcp)  How often do you need to have someone help you when you read instructions, pamphlets, or other written materials from your doctor or pharmacy?: 1 - Never  Nutrition Risk Assessment: Has the patient had any N/V/D within the last 2 months?  No  Does the patient have any non-healing wounds?  No  Has the patient had any unintentional weight loss or weight gain?  No   Diabetes: If diabetic, was a CBG obtained today?  No  Did the patient bring in their glucometer from home?  No  How often do you monitor your CBG's? Does not monitor. .   Financial Strains and Diabetes Management: Are you having any financial strains with the device, your supplies or your  medication? No .  Does the patient want to be seen by Chronic Care Management for management of their diabetes?  No  Would the patient like to be referred to a Nutritionist or for Diabetic Management?  No  Interpreter Needed?: No      Activities of Daily Living In your present state of health, do you have any difficulty performing the following activities: 06/09/2020  Hearing? N  Vision? N  Difficulty concentrating or making decisions? N  Walking or climbing stairs? Y  Comment R drop foot  Dressing or bathing? N  Doing errands, shopping? N  Preparing Food and eating ? N  Using the Toilet? N  In the past six months, have you accidently leaked urine?  N  Do you have problems with loss of bowel control? N  Managing your Medications? N  Managing your Finances? N  Housekeeping or managing your Housekeeping? N  Some recent data might be hidden    Patient Care Team: Crecencio Mc, MD as PCP - General (Internal Medicine) Bary Castilla, Forest Gleason, MD (General Surgery) Crecencio Mc, MD (Internal Medicine)  Indicate any recent Medical Services you may have received from other than [facility] providers in the past year (date may be approximate).     Assessment:   This is a routine wellness examination for [place].  I connected with S[place] today by telephone and verified that I am speaking with the correct person using two identifiers. Location patient: home Location provider: work Persons participating in the virtual visit: patient, Marine scientist.    I discussed the limitations, risks, security and privacy concerns of performing an evaluation and management service by telephone and the availability of in person appointments. The patient expressed understanding and verbally consented to this telephonic visit.    Interactive audio and video telecommunications were attempted between this provider and patient, however failed, due to patient having technical difficulties OR patient did not have access to  video capability.  We continued and completed visit with audio only.  Some vital signs may be absent or patient reported.   Hearing/Vision screen  Hearing Screening   125Hz  250Hz  500Hz  1000Hz  2000Hz  3000Hz  4000Hz  6000Hz  8000Hz   Right ear:           Left ear:           Comments: Patient is able to hear conversational tones without difficulty. No issues reported.  Vision Screening Comments: Followed by [facility]  Wears corrective lenses Last  Virtual visit Visual acuity not assessed per patient preference since they have regular follow up with the ophthalmologist  Dietary issues and exercise activities discussed: Current Exercise Habits: Home exercise routine  Low carb/healthy diet Good water intake  Goals Addressed              This Visit's Progress     Patient Stated   .  Maintain Healthy Lifestyle (pt-stated)        Stay active Healthy diet      Other   .  COMPLETED: DIET - INCREASE WATER INTAKE        Less caffeine intake      Depression Screen PHQ 2/9 Scores 06/09/2020 06/07/2019 06/10/2017 06/09/2016 09/17/2015 06/13/2015 06/10/2015  PHQ - 2 Score 0 0 0 0 0 0 0  PHQ- 9 Score - - - 0 - - -    Fall Risk Fall Risk  06/09/2020 02/11/2020 10/03/2019 07/24/2019 06/07/2019  Falls in the past year? 0 0 0 0 0  Number falls in past yr: 0 - - - -  Injury with Fall? 0 - - - -  Risk for fall due to : - - - - -  Follow up Falls evaluation completed Falls evaluation completed Falls evaluation completed Falls evaluation completed Falls evaluation completed    Gray: Handrails in use when climbing stairs? Yes Home free of loose throw rugs in walkways, pet beds, electrical cords, etc? Yes  Adequate lighting in your home to reduce risk of falls? Yes   ASSISTIVE DEVICES UTILIZED TO PREVENT FALLS: Life alert? No  Use of a cane, walker or w/c? No   TIMED UP AND GO: Was the test performed? No . Virtual visit.  Cognitive  Function: Patient is alert and oriented x3.  Denies difficulty focusing, making decisions, memory loss.  MMSE/6CIT deferred. Normal by direct communication/observation.   MMSE - Mini Mental State Exam 06/09/2016 06/10/2015  Orientation to time 5 5  Orientation to Place 5 5  Registration 3 3  Attention/ Calculation 5 5  Recall 3 3  Language- name 2 objects 2 2  Language- repeat 1 1  Language- follow 3 step command 3 3  Language- read & follow direction 1 1  Write a sentence 1 1  Copy design 1 1  Total score 30 30     6CIT Screen 06/07/2019 06/10/2017  What Year? 0 points 0 points  What month? 0 points 0 points  What time? 0 points 0 points  Count back from 20 0 points 0 points  Months in reverse 0 points 0 points  Repeat phrase 0 points 0 points  Total Score 0 0    Immunizations Immunization History  Administered Date(s) Administered  . Fluad Quad(high Dose 65+) 09/15/2018, 10/15/2019  . Influenza Split 09/29/2011  . Influenza, High Dose Seasonal PF 11/11/2014, 09/17/2015, 10/22/2016, 11/03/2017  . Influenza,inj,Quad PF,6+ Mos 10/31/2012, 10/13/2013  . Moderna Sars-Covid-2 Vaccination 01/30/2019, 02/27/2019, 03/07/2020  . Pneumococcal Conjugate-13 04/16/2013  . Pneumococcal Polysaccharide-23 07/15/2010, 03/14/2015  . Tdap 06/29/2010  . Zoster 05/11/2010  . Zoster Recombinat (Shingrix) 09/14/2017, 11/22/2017   Health Maintenance Health Maintenance  Topic Date Due  . COLONOSCOPY (Pts 45-73yrs Insurance coverage will need to be confirmed)  04/29/2020  . COVID-19 Vaccine (4 - Booster for Moderna series) 01/16/2021 (Originally 06/04/2020)  . TETANUS/TDAP  06/28/2020  . HEMOGLOBIN A1C  08/10/2020  . INFLUENZA VACCINE  08/18/2020  . OPHTHALMOLOGY EXAM  10/03/2020  . FOOT EXAM  02/10/2021  . Hepatitis C Screening  Completed  . PNA vac Low Risk Adult  Completed  . HPV VACCINES  Aged Out   Colorectal cancer screening: No longer required.    Lung Cancer Screening: (Low Dose  CT Chest recommended if Age 40-80 years, 30 pack-year currently smoking OR have quit w/in 15years.) does not qualify.   Vision Screening: Recommended annual ophthalmology exams for early detection of glaucoma and other disorders of the eye. Is the patient up to date with their annual eye exam?  Yes   Dental Screening: Recommended annual dental exams for proper oral hygiene.  Community Resource Referral / Chronic Care Management: CRR required this visit?  No   CCM required this visit?  No      Plan:    Keep all routine maintenance appointments.   I have personally reviewed and noted the following in the patient's chart:   . Medical and social history . Use of alcohol, tobacco or illicit drugs  . Current medications and supplements including opioid prescriptions. Patient is not currently taking opioid prescriptions. . Functional ability and status . Nutritional status . Physical activity . Advanced directives . List of other physicians . Hospitalizations, surgeries, and ER visits in previous 12 months . Vitals . Screenings to include cognitive, depression, and falls . Referrals and appointments  In addition, I have reviewed and discussed with patient certain preventive protocols, quality metrics, and best practice recommendations. A written personalized care plan for preventive services as well as general preventive health recommendations were provided to patient.     OBrien-Blaney, Watson Robarge L, LPN   [id number]    I have reviewed the above information and agree with above.   Deborra Medina, MD

## 2020-06-19 ENCOUNTER — Other Ambulatory Visit: Payer: Self-pay | Admitting: Internal Medicine

## 2020-06-27 ENCOUNTER — Other Ambulatory Visit: Payer: Self-pay | Admitting: Internal Medicine

## 2020-07-14 ENCOUNTER — Other Ambulatory Visit: Payer: Self-pay | Admitting: Internal Medicine

## 2020-07-30 DIAGNOSIS — D485 Neoplasm of uncertain behavior of skin: Secondary | ICD-10-CM | POA: Diagnosis not present

## 2020-07-30 DIAGNOSIS — D225 Melanocytic nevi of trunk: Secondary | ICD-10-CM | POA: Diagnosis not present

## 2020-07-30 DIAGNOSIS — D2261 Melanocytic nevi of right upper limb, including shoulder: Secondary | ICD-10-CM | POA: Diagnosis not present

## 2020-07-30 DIAGNOSIS — D2271 Melanocytic nevi of right lower limb, including hip: Secondary | ICD-10-CM | POA: Diagnosis not present

## 2020-07-30 DIAGNOSIS — X32XXXA Exposure to sunlight, initial encounter: Secondary | ICD-10-CM | POA: Diagnosis not present

## 2020-07-30 DIAGNOSIS — Z85820 Personal history of malignant melanoma of skin: Secondary | ICD-10-CM | POA: Diagnosis not present

## 2020-07-30 DIAGNOSIS — Z85828 Personal history of other malignant neoplasm of skin: Secondary | ICD-10-CM | POA: Diagnosis not present

## 2020-07-30 DIAGNOSIS — L570 Actinic keratosis: Secondary | ICD-10-CM | POA: Diagnosis not present

## 2020-08-14 DIAGNOSIS — M5116 Intervertebral disc disorders with radiculopathy, lumbar region: Secondary | ICD-10-CM | POA: Diagnosis not present

## 2020-08-14 DIAGNOSIS — M21372 Foot drop, left foot: Secondary | ICD-10-CM | POA: Diagnosis not present

## 2020-08-14 DIAGNOSIS — M21371 Foot drop, right foot: Secondary | ICD-10-CM | POA: Diagnosis not present

## 2020-09-16 ENCOUNTER — Other Ambulatory Visit: Payer: Self-pay | Admitting: Internal Medicine

## 2020-10-01 ENCOUNTER — Other Ambulatory Visit: Payer: Self-pay | Admitting: Internal Medicine

## 2020-10-08 DIAGNOSIS — Z0100 Encounter for examination of eyes and vision without abnormal findings: Secondary | ICD-10-CM | POA: Diagnosis not present

## 2020-10-08 DIAGNOSIS — E119 Type 2 diabetes mellitus without complications: Secondary | ICD-10-CM | POA: Diagnosis not present

## 2020-10-08 LAB — HM DIABETES EYE EXAM

## 2020-10-21 DIAGNOSIS — M21372 Foot drop, left foot: Secondary | ICD-10-CM | POA: Diagnosis not present

## 2020-10-21 DIAGNOSIS — E538 Deficiency of other specified B group vitamins: Secondary | ICD-10-CM | POA: Diagnosis not present

## 2020-10-21 DIAGNOSIS — G608 Other hereditary and idiopathic neuropathies: Secondary | ICD-10-CM | POA: Diagnosis not present

## 2020-10-21 DIAGNOSIS — M21371 Foot drop, right foot: Secondary | ICD-10-CM | POA: Diagnosis not present

## 2020-10-24 DIAGNOSIS — M21371 Foot drop, right foot: Secondary | ICD-10-CM | POA: Diagnosis not present

## 2020-10-24 DIAGNOSIS — M21372 Foot drop, left foot: Secondary | ICD-10-CM | POA: Diagnosis not present

## 2020-10-24 DIAGNOSIS — M5116 Intervertebral disc disorders with radiculopathy, lumbar region: Secondary | ICD-10-CM | POA: Diagnosis not present

## 2020-11-06 DIAGNOSIS — H2511 Age-related nuclear cataract, right eye: Secondary | ICD-10-CM | POA: Diagnosis not present

## 2020-11-07 ENCOUNTER — Encounter: Payer: Self-pay | Admitting: Ophthalmology

## 2020-11-20 NOTE — Discharge Instructions (Signed)

## 2020-11-24 ENCOUNTER — Ambulatory Visit: Payer: Medicare PPO | Admitting: Anesthesiology

## 2020-11-24 ENCOUNTER — Ambulatory Visit
Admission: RE | Admit: 2020-11-24 | Discharge: 2020-11-24 | Disposition: A | Discharge status: Home / Self Care | Payer: Medicare PPO | Attending: Ophthalmology | Admitting: Ophthalmology

## 2020-11-24 ENCOUNTER — Other Ambulatory Visit: Payer: Self-pay

## 2020-11-24 ENCOUNTER — Encounter
Admission: RE | Disposition: A | Discharge status: Home / Self Care | Payer: Self-pay | Source: Home / Self Care | Attending: Ophthalmology

## 2020-11-24 ENCOUNTER — Encounter: Payer: Self-pay | Admitting: Ophthalmology

## 2020-11-24 DIAGNOSIS — H2511 Age-related nuclear cataract, right eye: Secondary | ICD-10-CM | POA: Insufficient documentation

## 2020-11-24 DIAGNOSIS — E1136 Type 2 diabetes mellitus with diabetic cataract: Secondary | ICD-10-CM | POA: Diagnosis not present

## 2020-11-24 DIAGNOSIS — I10 Essential (primary) hypertension: Secondary | ICD-10-CM | POA: Diagnosis not present

## 2020-11-24 DIAGNOSIS — H25811 Combined forms of age-related cataract, right eye: Secondary | ICD-10-CM | POA: Diagnosis not present

## 2020-11-24 DIAGNOSIS — E785 Hyperlipidemia, unspecified: Secondary | ICD-10-CM | POA: Diagnosis not present

## 2020-11-24 DIAGNOSIS — E1142 Type 2 diabetes mellitus with diabetic polyneuropathy: Secondary | ICD-10-CM | POA: Diagnosis not present

## 2020-11-24 DIAGNOSIS — G709 Myoneural disorder, unspecified: Secondary | ICD-10-CM | POA: Insufficient documentation

## 2020-11-24 DIAGNOSIS — Z87891 Personal history of nicotine dependence: Secondary | ICD-10-CM | POA: Insufficient documentation

## 2020-11-24 HISTORY — PX: CATARACT EXTRACTION W/PHACO: SHX586

## 2020-11-24 LAB — GLUCOSE, CAPILLARY
Glucose-Capillary: 148 mg/dL — ABNORMAL HIGH (ref 70–99)
Glucose-Capillary: 150 mg/dL — ABNORMAL HIGH (ref 70–99)

## 2020-11-24 SURGERY — PHACOEMULSIFICATION, CATARACT, WITH IOL INSERTION
Anesthesia: Monitor Anesthesia Care | Site: Eye | Laterality: Right

## 2020-11-24 MED ORDER — SIGHTPATH DOSE#1 BSS IO SOLN
INTRAOCULAR | Status: DC | PRN
  Administered 2020-11-24: 68 mL via OPHTHALMIC

## 2020-11-24 MED ORDER — LACTATED RINGERS IV SOLN: INTRAVENOUS | Status: DC

## 2020-11-24 MED ORDER — SIGHTPATH DOSE#1 SODIUM HYALURONATE 23 MG/ML IO SOLUTION
PREFILLED_SYRINGE | INTRAOCULAR | Status: DC | PRN
  Administered 2020-11-24: 0.6 mL via INTRAOCULAR

## 2020-11-24 MED ORDER — SIGHTPATH DOSE#1 SODIUM HYALURONATE 10 MG/ML IO SOLUTION
PREFILLED_SYRINGE | INTRAOCULAR | Status: DC | PRN
  Administered 2020-11-24: 0.85 mL via INTRAOCULAR

## 2020-11-24 MED ORDER — LIDOCAINE HCL (PF) 2 % IJ SOLN
INTRAOCULAR | Status: DC | PRN
  Administered 2020-11-24: 1 mL via INTRAOCULAR

## 2020-11-24 MED ORDER — ACETAMINOPHEN 10 MG/ML IV SOLN: 1000.0000 mg | Freq: Once | INTRAVENOUS | Status: DC | PRN

## 2020-11-24 MED ORDER — ARMC OPHTHALMIC DILATING DROPS
1.0000 "application " | OPHTHALMIC | Status: AC | PRN
  Administered 2020-11-24 (×3): 1 via OPHTHALMIC

## 2020-11-24 MED ORDER — ONDANSETRON HCL 4 MG/2ML IJ SOLN: 4.0000 mg | Freq: Once | INTRAMUSCULAR | Status: DC | PRN

## 2020-11-24 MED ORDER — TETRACAINE HCL 0.5 % OP SOLN
1.0000 [drp] | OPHTHALMIC | Status: AC | PRN
  Administered 2020-11-24 (×3): 1 [drp] via OPHTHALMIC

## 2020-11-24 MED ORDER — FENTANYL CITRATE (PF) 100 MCG/2ML IJ SOLN
INTRAMUSCULAR | Status: DC | PRN
  Administered 2020-11-24 (×2): 50 ug via INTRAVENOUS

## 2020-11-24 MED ORDER — CEFUROXIME OPHTHALMIC INJECTION 1 MG/0.1 ML
INJECTION | OPHTHALMIC | Status: DC | PRN
  Administered 2020-11-24: 0.1 mL via INTRACAMERAL

## 2020-11-24 MED ORDER — MIDAZOLAM HCL 2 MG/2ML IJ SOLN
INTRAMUSCULAR | Status: DC | PRN
  Administered 2020-11-24: 2 mg via INTRAVENOUS

## 2020-11-24 MED ORDER — SIGHTPATH DOSE#1 BSS IO SOLN
INTRAOCULAR | Status: DC | PRN
  Administered 2020-11-24: 15 mL

## 2020-11-24 SURGICAL SUPPLY — 14 items
CANNULA ANT/CHMB 27GA (MISCELLANEOUS) IMPLANT
DISSECTOR HYDRO NUCLEUS 50X22 (MISCELLANEOUS) ×2 IMPLANT
GLOVE SURG GAMMEX PI TX LF 7.5 (GLOVE) ×2 IMPLANT
GLOVE SURG SYN 8.5  E (GLOVE) ×1
GLOVE SURG SYN 8.5 E (GLOVE) ×1 IMPLANT
GOWN STRL REUS W/ TWL LRG LVL3 (GOWN DISPOSABLE) ×2 IMPLANT
GOWN STRL REUS W/TWL LRG LVL3 (GOWN DISPOSABLE) ×4
LENS IOL TECNIS EYHANCE 22.0 (Intraocular Lens) ×2 IMPLANT
MARKER SKIN DUAL TIP RULER LAB (MISCELLANEOUS) IMPLANT
PACK EYE AFTER SURG (MISCELLANEOUS) ×2 IMPLANT
SYR 3ML LL SCALE MARK (SYRINGE) ×2 IMPLANT
SYR TB 1ML LUER SLIP (SYRINGE) ×2 IMPLANT
WATER STERILE IRR 250ML POUR (IV SOLUTION) ×2 IMPLANT
WIPE NON LINTING 3.25X3.25 (MISCELLANEOUS) ×2 IMPLANT

## 2020-11-24 NOTE — Anesthesia Postprocedure Evaluation (Signed)
Anesthesia Post Note  Patient: [name] [name]  Procedure(s) Performed: CATARACT EXTRACTION PHACO AND INTRAOCULAR LENS PLACEMENT (IOC) RIGHT DIABETIC 4.17 00:33.8 (Right: Eye)     Patient location during evaluation: PACU Anesthesia Type: MAC Level of consciousness: awake and alert Pain management: pain level controlled Vital Signs Assessment: post-procedure vital signs reviewed and stable Respiratory status: spontaneous breathing, nonlabored ventilation, respiratory function stable and patient connected to nasal cannula oxygen Cardiovascular status: stable and blood pressure returned to baseline Postop Assessment: no apparent nausea or vomiting Anesthetic complications: no   No notable events documented.  Zafira Munos A  Sung Parodi

## 2020-11-24 NOTE — Anesthesia Preprocedure Evaluation (Signed)
Anesthesia Evaluation  Patient identified by MRN, date of birth, ID band Patient awake    Reviewed: Allergy & Precautions, NPO status , Patient's Chart, lab work & pertinent test results, reviewed documented beta blocker date and time   History of Anesthesia Complications Negative for: history of anesthetic complications  Airway Mallampati: III  TM Distance: >3 FB Neck ROM: Limited    Dental  (+) Partial Lower   Pulmonary former smoker,    breath sounds clear to auscultation       Cardiovascular hypertension, (-) angina(-) DOE  Rhythm:Regular Rate:Normal   HLD   Neuro/Psych  Neuromuscular disease    GI/Hepatic neg GERD  ,  Endo/Other  diabetes, Type 2  Renal/GU      Musculoskeletal   Abdominal   Peds  Hematology   Anesthesia Other Findings   Reproductive/Obstetrics                            Anesthesia Physical Anesthesia Plan  ASA: 2  Anesthesia Plan: MAC   Post-op Pain Management:    Induction: Intravenous  PONV Risk Score and Plan: 1 and TIVA, Midazolam and Treatment may vary due to age or medical condition  Airway Management Planned: Nasal Cannula  Additional Equipment:   Intra-op Plan:   Post-operative Plan:   Informed Consent: I have reviewed the patients History and Physical, chart, labs and discussed the procedure including the risks, benefits and alternatives for the proposed anesthesia with the patient or authorized representative who has indicated his/her understanding and acceptance.       Plan Discussed with: CRNA and Anesthesiologist  Anesthesia Plan Comments:        Anesthesia Quick Evaluation

## 2020-11-24 NOTE — Op Note (Signed)
OPERATIVE NOTE  [name] [name] [id number] 11/24/2020   PREOPERATIVE DIAGNOSIS:  Nuclear sclerotic cataract right eye.  H25.11   POSTOPERATIVE DIAGNOSIS:    Nuclear sclerotic cataract right eye.     PROCEDURE:  Phacoemusification with posterior chamber intraocular lens placement of the right eye   LENS:   Implant Name Type Inv. Item Serial No. Manufacturer Lot No. LRB No. Used Action  LENS IOL TECNIS EYHANCE 22.0 - [id number] Intraocular Lens LENS IOL TECNIS EYHANCE 22.0 [id number] [id number]   Right 1 Implanted       Procedure(s): CATARACT EXTRACTION PHACO AND INTRAOCULAR LENS PLACEMENT (IOC) RIGHT DIABETIC 4.17 00:33.8 (Right)  DIB00 +22.0   SURGEON:  Benay Pillow, MD, MPH  ANESTHESIOLOGIST: Anesthesiologist: Heniser, Fredric Dine, MD CRNA: Jeannene Patella, CRNA   ANESTHESIA:  Topical with tetracaine drops augmented with 1% preservative-free intracameral lidocaine.  ESTIMATED BLOOD LOSS: less than 1 mL.   COMPLICATIONS:  None.   DESCRIPTION OF PROCEDURE:  The patient was identified in the holding room and transported to the operating room and placed in the supine position under the operating microscope.  The right eye was identified as the operative eye and it was prepped and draped in the usual sterile ophthalmic fashion.   A 1.0 millimeter clear-corneal paracentesis was made at the 10:30 position. 0.5 ml of preservative-free 1% lidocaine with epinephrine was injected into the anterior chamber.  The anterior chamber was filled with Healon 5 viscoelastic.  A 2.4 millimeter keratome was used to make a near-clear corneal incision at the 8:00 position.  A curvilinear capsulorrhexis was made with a cystotome and capsulorrhexis forceps.  Balanced salt solution was used to hydrodissect and hydrodelineate the nucleus.   Phacoemulsification was then used in stop and chop fashion to remove the lens nucleus and epinucleus.  The remaining cortex was then removed using the irrigation and  aspiration handpiece. Healon was then placed into the capsular bag to distend it for lens placement.  A lens was then injected into the capsular bag.  The remaining viscoelastic was aspirated.   Wounds were hydrated with balanced salt solution.  The anterior chamber was inflated to a physiologic pressure with balanced salt solution.   Intracameral cefuroxime 0.1 mL at 10 mg/mL was injected into the eye.  No wound leaks were noted.  The patient was taken to the recovery room in stable condition without complications of anesthesia or surgery  Benay Pillow 11/24/2020, 9:11 AM

## 2020-11-24 NOTE — H&P (Signed)
[facility]   Primary Care Physician:  Crecencio Mc, MD Ophthalmologist: Dr. Benay Pillow  Pre-Procedure History & Physical: HPI:  [name] [name] is a 80 y.o. male here for cataract surgery.   Past Medical History:  Diagnosis Date   Diabetes mellitus    Type 2   Hyperlipidemia    Hypertension    Peripheral neuropathy 01/19/2007   positive EMG studies, negative workup for causes   Positive PPD, treated 01/19/1968    Past Surgical History:  Procedure Laterality Date   COLONOSCOPY  2004, 2014   Dr Bary Castilla   COLONOSCOPY WITH PROPOFOL N/A 04/30/2015   Procedure: COLONOSCOPY WITH PROPOFOL;  Surgeon: Robert Bellow, MD;  Location: [facility] ENDOSCOPY;  Service: Endoscopy;  Laterality: N/A;   ROTATOR CUFF REPAIR  2005   right (Dr. Francia Greaves)   TONSILLECTOMY     WRIST FRACTURE SURGERY Left    Armour, Ted     Prior to Admission medications   Medication Sig Start Date End Date Taking? Authorizing Provider  amLODipine (NORVASC) 10 MG tablet TAKE 1 TABLET BY MOUTH DAILY 06/19/20  Yes Crecencio Mc, MD  aspirin 81 MG tablet Take 81 mg by mouth daily.   Yes [provider]  B-D 3CC LUER-LOK SYR 25GX1" 25G X 1" 3 ML MISC USE AS DIRECTED FOR B12 INJECTIONS 06/20/19  Yes Crecencio Mc, MD  Cholecalciferol (D3-1000) 25 MCG (1000 UT) capsule Take 1,000 Units by mouth daily.   Yes [provider]  Choline Fenofibrate (FENOFIBRIC ACID) 135 MG CPDR TAKE 1 CAPSULE BY MOUTH EVERY DAY. 10/01/20  Yes Crecencio Mc, MD  co-enzyme Q-10 30 MG capsule Take 30 mg by mouth daily.   Yes [provider]  cyanocobalamin (,VITAMIN B-12,) 1000 MCG/ML injection INJECT 1 ML INTO THE MUSCLE EVERY 30 DAYS AS DIRECTED 06/30/20  Yes Crecencio Mc, MD  fish oil-omega-3 fatty acids 1000 MG capsule Take 2 g by mouth daily.   Yes [provider]  glipiZIDE (GLUCOTROL) 5 MG tablet TAKE ONE-HALF (1/2) TABLET BY MOUTH TWICE DAILY WITH MEALS 06/19/20  Yes Crecencio Mc, MD   metFORMIN (GLUCOPHAGE) 1000 MG tablet TAKE ONE TABLET TWICE A DAY WITH MEALS 09/17/20  Yes Crecencio Mc, MD  Multiple Vitamin (MULTIVITAMIN) tablet Take 1 tablet by mouth daily.   Yes [provider]  NEEDLE, DISP, 25 G 01S X 1" MISC 1 application by Does not apply route every 30 (thirty) days. 03/17/17  Yes Einar Pheasant, MD  rosuvastatin (CRESTOR) 40 MG tablet TAKE 1 TABLET BY MOUTH EVERY EVENING. 03/18/20  Yes Crecencio Mc, MD  telmisartan (MICARDIS) 40 MG tablet TAKE 1 TABLET BY MOUTH AT BEDTIME 06/19/20  Yes Crecencio Mc, MD    Allergies as of 10/13/2020 - Review Complete 06/09/2020  Allergen Reaction Noted   Neosporin [neomycin-bacitracin zn-polymyx] Rash 04/30/2015   Polysporin [bacitracin-polymyxin b] Rash 04/30/2015    Family History  Problem Relation Age of Onset   Heart attack Father    Dementia Sister    Kidney disease Sister    Cancer Neg Hx     Social History   Socioeconomic History   Marital status: Married    Spouse name: Not on file   Number of children: Not on file   Years of education: Not on file   Highest education level: Not on file  Occupational History   Not on file  Tobacco Use   Smoking status: Former    Types:  Cigarettes    Quit date: 10/18/1980    Years since quitting: 40.1   Smokeless tobacco: Former    Types: Chew    Quit date: 10/19/1990  Substance and Sexual Activity   Alcohol use: Never   Drug use: No   Sexual activity: Not Currently  Other Topics Concern   Not on file  Social History Narrative   Not on file   Social Determinants of Health   Financial Resource Strain: Low Risk    Difficulty of Paying Living Expenses: Not hard at all  Food Insecurity: No Food Insecurity   Worried About Charity fundraiser in the Last Year: Never true   [place] in the Last Year: Never true  Transportation Needs: No Transportation Needs   Lack of Transportation (Medical): No   Lack of Transportation (Non-Medical): No   Physical Activity: Sufficiently Active   Days of Exercise per Week: 3 days   Minutes of Exercise per Session: 60 min  Stress: No Stress Concern Present   Feeling of Stress : Not at all  Social Connections: Unknown   Frequency of Communication with Friends and Family: Not on file   Frequency of Social Gatherings with Friends and Family: Not on file   Attends Religious Services: Not on Electrical engineer or Organizations: Not on file   Attends Archivist Meetings: Not on file   Marital Status: Married  Human resources officer Violence: Not At Risk   Fear of Current or Ex-Partner: No   Emotionally Abused: No   Physically Abused: No   Sexually Abused: No    Review of Systems: See HPI, otherwise negative ROS  Physical Exam: BP 113/66   Pulse 65   Temp 97.7 F (36.5 C) (Temporal)   Resp 18   Ht 5\' 10"  (1.778 m)   Wt 89.4 kg   SpO2 96%   BMI 28.27 kg/m  General:   Alert, cooperative in NAD Head:  Normocephalic and atraumatic. Respiratory:  Normal work of breathing. Cardiovascular:  RRR  Impression/Plan: [name] [name] is here for cataract surgery.  Risks, benefits, limitations, and alternatives regarding cataract surgery have been reviewed with the patient.  Questions have been answered.  All parties agreeable.   Benay Pillow, MD  11/24/2020, 8:44 AM

## 2020-11-24 NOTE — Transfer of Care (Signed)
Immediate Anesthesia Transfer of Care Note  Patient: [name] [name]  Procedure(s) Performed: CATARACT EXTRACTION PHACO AND INTRAOCULAR LENS PLACEMENT (IOC) RIGHT DIABETIC (Right: Eye)  Patient Location: PACU  Anesthesia Type: MAC  Level of Consciousness: awake, alert  and patient cooperative  Airway and Oxygen Therapy: Patient Spontanous Breathing and Patient connected to supplemental oxygen  Post-op Assessment: Post-op Vital signs reviewed, Patient's Cardiovascular Status Stable, Respiratory Function Stable, Patent Airway and No signs of Nausea or vomiting  Post-op Vital Signs: Reviewed and stable  Complications: No notable events documented.

## 2020-11-24 NOTE — Anesthesia Procedure Notes (Signed)
Procedure Name: MAC Date/Time: 11/24/2020 8:54 AM Performed by: Jeannene Patella, CRNA Pre-anesthesia Checklist: Patient identified, Emergency Drugs available, Suction available, Timeout performed and Patient being monitored Patient Re-evaluated:Patient Re-evaluated prior to induction Oxygen Delivery Method: Nasal cannula Placement Confirmation: positive ETCO2

## 2020-11-25 ENCOUNTER — Encounter: Payer: Self-pay | Admitting: Ophthalmology

## 2020-11-28 ENCOUNTER — Encounter: Payer: Self-pay | Admitting: Ophthalmology

## 2020-12-03 DIAGNOSIS — M21371 Foot drop, right foot: Secondary | ICD-10-CM | POA: Diagnosis not present

## 2020-12-04 DIAGNOSIS — H2512 Age-related nuclear cataract, left eye: Secondary | ICD-10-CM | POA: Diagnosis not present

## 2020-12-04 NOTE — Discharge Instructions (Signed)

## 2020-12-08 ENCOUNTER — Encounter: Payer: Self-pay | Admitting: Ophthalmology

## 2020-12-08 ENCOUNTER — Ambulatory Visit: Payer: Medicare PPO | Admitting: Anesthesiology

## 2020-12-08 ENCOUNTER — Encounter
Admission: RE | Disposition: A | Discharge status: Home / Self Care | Payer: Self-pay | Source: Home / Self Care | Attending: Ophthalmology

## 2020-12-08 ENCOUNTER — Ambulatory Visit
Admission: RE | Admit: 2020-12-08 | Discharge: 2020-12-08 | Disposition: A | Discharge status: Home / Self Care | Payer: Medicare PPO | Attending: Ophthalmology | Admitting: Ophthalmology

## 2020-12-08 ENCOUNTER — Telehealth: Payer: Self-pay | Admitting: Internal Medicine

## 2020-12-08 ENCOUNTER — Other Ambulatory Visit: Payer: Self-pay

## 2020-12-08 DIAGNOSIS — I10 Essential (primary) hypertension: Secondary | ICD-10-CM | POA: Insufficient documentation

## 2020-12-08 DIAGNOSIS — I4891 Unspecified atrial fibrillation: Secondary | ICD-10-CM | POA: Diagnosis not present

## 2020-12-08 DIAGNOSIS — E1142 Type 2 diabetes mellitus with diabetic polyneuropathy: Secondary | ICD-10-CM | POA: Insufficient documentation

## 2020-12-08 DIAGNOSIS — Z87891 Personal history of nicotine dependence: Secondary | ICD-10-CM | POA: Insufficient documentation

## 2020-12-08 DIAGNOSIS — E1136 Type 2 diabetes mellitus with diabetic cataract: Secondary | ICD-10-CM | POA: Insufficient documentation

## 2020-12-08 DIAGNOSIS — E785 Hyperlipidemia, unspecified: Secondary | ICD-10-CM | POA: Diagnosis not present

## 2020-12-08 DIAGNOSIS — H2512 Age-related nuclear cataract, left eye: Secondary | ICD-10-CM | POA: Diagnosis not present

## 2020-12-08 DIAGNOSIS — H25812 Combined forms of age-related cataract, left eye: Secondary | ICD-10-CM | POA: Diagnosis not present

## 2020-12-08 HISTORY — PX: CATARACT EXTRACTION W/PHACO: SHX586

## 2020-12-08 LAB — GLUCOSE, CAPILLARY
Glucose-Capillary: 163 mg/dL — ABNORMAL HIGH (ref 70–99)
Glucose-Capillary: 129 mg/dL — ABNORMAL HIGH (ref 70–99)

## 2020-12-08 SURGERY — PHACOEMULSIFICATION, CATARACT, WITH IOL INSERTION
Anesthesia: Monitor Anesthesia Care | Site: Eye | Laterality: Left

## 2020-12-08 MED ORDER — LACTATED RINGERS IV SOLN: INTRAVENOUS | Status: DC

## 2020-12-08 MED ORDER — MIDAZOLAM HCL 2 MG/2ML IJ SOLN
INTRAMUSCULAR | Status: DC | PRN
Start: 2020-12-08 — End: 2020-12-08
  Administered 2020-12-08 (×2): 1 mg via INTRAVENOUS

## 2020-12-08 MED ORDER — SIGHTPATH DOSE#1 SODIUM HYALURONATE 10 MG/ML IO SOLUTION
PREFILLED_SYRINGE | INTRAOCULAR | Status: DC | PRN
  Administered 2020-12-08: 0.85 mL via INTRAOCULAR

## 2020-12-08 MED ORDER — SIGHTPATH DOSE#1 BSS IO SOLN
INTRAOCULAR | Status: DC | PRN
  Administered 2020-12-08: 15 mL

## 2020-12-08 MED ORDER — SIGHTPATH DOSE#1 BSS IO SOLN
INTRAOCULAR | Status: DC | PRN
  Administered 2020-12-08: 75 mL via OPHTHALMIC

## 2020-12-08 MED ORDER — SIGHTPATH DOSE#1 SODIUM HYALURONATE 23 MG/ML IO SOLUTION
PREFILLED_SYRINGE | INTRAOCULAR | Status: DC | PRN
  Administered 2020-12-08: 0.6 mL via INTRAOCULAR

## 2020-12-08 MED ORDER — LIDOCAINE HCL (PF) 2 % IJ SOLN
INTRAOCULAR | Status: DC | PRN
  Administered 2020-12-08: 1 mL via INTRAOCULAR

## 2020-12-08 MED ORDER — ACETAMINOPHEN 325 MG PO TABS: 325.0000 mg | ORAL_TABLET | ORAL | Status: DC | PRN

## 2020-12-08 MED ORDER — ARMC OPHTHALMIC DILATING DROPS
1.0000 "application " | OPHTHALMIC | Status: AC | PRN
  Administered 2020-12-08 (×3): 1 via OPHTHALMIC

## 2020-12-08 MED ORDER — ACETAMINOPHEN 160 MG/5ML PO SOLN: 325.0000 mg | ORAL | Status: DC | PRN

## 2020-12-08 MED ORDER — TETRACAINE HCL 0.5 % OP SOLN
1.0000 [drp] | OPHTHALMIC | Status: AC | PRN
  Administered 2020-12-08 (×3): 1 [drp] via OPHTHALMIC

## 2020-12-08 MED ORDER — ONDANSETRON HCL 4 MG/2ML IJ SOLN: 4.0000 mg | Freq: Once | INTRAMUSCULAR | Status: DC | PRN

## 2020-12-08 MED ORDER — FENTANYL CITRATE (PF) 100 MCG/2ML IJ SOLN
INTRAMUSCULAR | Status: DC | PRN
  Administered 2020-12-08 (×2): 50 ug via INTRAVENOUS

## 2020-12-08 MED ORDER — CEFUROXIME OPHTHALMIC INJECTION 1 MG/0.1 ML
INJECTION | OPHTHALMIC | Status: DC | PRN
  Administered 2020-12-08: 0.1 mL via OPHTHALMIC

## 2020-12-08 SURGICAL SUPPLY — 13 items
CANNULA ANT/CHMB 27GA (MISCELLANEOUS) ×2 IMPLANT
DISSECTOR HYDRO NUCLEUS 50X22 (MISCELLANEOUS) ×2 IMPLANT
GLOVE SURG GAMMEX PI TX LF 7.5 (GLOVE) ×2 IMPLANT
GLOVE SURG SYN 8.5  E (GLOVE) ×1
GLOVE SURG SYN 8.5 E (GLOVE) ×1 IMPLANT
GOWN STRL REUS W/ TWL LRG LVL3 (GOWN DISPOSABLE) ×2 IMPLANT
GOWN STRL REUS W/TWL LRG LVL3 (GOWN DISPOSABLE) ×4
LENS IOL TECNIS EYHANCE 22.5 (Intraocular Lens) ×2 IMPLANT
PACK EYE AFTER SURG (MISCELLANEOUS) ×2 IMPLANT
SYR 3ML LL SCALE MARK (SYRINGE) ×2 IMPLANT
SYR TB 1ML LUER SLIP (SYRINGE) ×2 IMPLANT
WATER STERILE IRR 250ML POUR (IV SOLUTION) ×2 IMPLANT
WIPE NON LINTING 3.25X3.25 (MISCELLANEOUS) ×2 IMPLANT

## 2020-12-08 NOTE — Anesthesia Postprocedure Evaluation (Signed)
Anesthesia Post Note  Patient: [name] [name]  Procedure(s) Performed: CATARACT EXTRACTION PHACO AND INTRAOCULAR LENS PLACEMENT (IOC) LEFT DIABETIC (Left: Eye)     Patient location during evaluation: PACU Anesthesia Type: MAC Level of consciousness: awake and alert Pain management: pain level controlled Vital Signs Assessment: post-procedure vital signs reviewed and stable Respiratory status: spontaneous breathing, nonlabored ventilation, respiratory function stable and patient connected to nasal cannula oxygen Cardiovascular status: stable and blood pressure returned to baseline Postop Assessment: no apparent nausea or vomiting Anesthetic complications: no   No notable events documented.  Sinda Du  Patient remains asymptomatic in afib. Discussed with son and patient to see PCP and/or cardiologist ASAP and they have concurred.

## 2020-12-08 NOTE — Anesthesia Procedure Notes (Signed)
Procedure Name: MAC Date/Time: 12/08/2020 8:10 AM Performed by: Cameron Ali, CRNA Pre-anesthesia Checklist: Patient identified, Emergency Drugs available, Suction available, Timeout performed and Patient being monitored Patient Re-evaluated:Patient Re-evaluated prior to induction Oxygen Delivery Method: Nasal cannula Placement Confirmation: positive ETCO2

## 2020-12-08 NOTE — Telephone Encounter (Signed)
Pt stated that he went for eye surgery for cataract and the Dr. Electa Sniff pt that he have a AFIB heart. Pt stated eye doctor advise him to schedule appt with PCP asap. No avail appt with Dr. Derrel Nip. Schedule Pt with NP Flinchum for tomorrow 12/09/2020 @ 3:00pm.

## 2020-12-08 NOTE — Anesthesia Preprocedure Evaluation (Addendum)
Anesthesia Evaluation  Patient identified by MRN, date of birth, ID band Patient awake    Reviewed: Allergy & Precautions, NPO status , Patient's Chart, lab work & pertinent test results, reviewed documented beta blocker date and time   History of Anesthesia Complications Negative for: history of anesthetic complications  Airway Mallampati: III  TM Distance: >3 FB Neck ROM: Limited    Dental  (+) Partial Lower   Pulmonary former smoker,    Pulmonary exam normal breath sounds clear to auscultation       Cardiovascular Exercise Tolerance: Good hypertension, (-) angina(-) DOE + dysrhythmias (No reported history, present today) Atrial Fibrillation  Rhythm:Irregular Rate:Normal   HLD   Neuro/Psych  Neuromuscular disease    GI/Hepatic neg GERD  ,  Endo/Other  diabetes, Type 2  Renal/GU      Musculoskeletal   Abdominal Normal abdominal exam  (+) - obese,  Abdomen: soft.    Peds  Hematology   Anesthesia Other Findings   Reproductive/Obstetrics                            Anesthesia Physical  Anesthesia Plan  ASA: 3  Anesthesia Plan: MAC   Post-op Pain Management:    Induction: Intravenous  PONV Risk Score and Plan: 1 and TIVA, Midazolam and Treatment may vary due to age or medical condition  Airway Management Planned: Nasal Cannula  Additional Equipment:   Intra-op Plan:   Post-operative Plan:   Informed Consent: I have reviewed the patients History and Physical, chart, labs and discussed the procedure including the risks, benefits and alternatives for the proposed anesthesia with the patient or authorized representative who has indicated his/her understanding and acceptance.       Plan Discussed with: CRNA and Anesthesiologist  Anesthesia Plan Comments:         Anesthesia Quick Evaluation  Patient Active Problem List   Diagnosis Date Noted  . Sciatica associated  with disorder of lumbosacral spine 10/04/2019  . Low back pain 10/03/2019  . Cerumen impaction 08/25/2017  . Weight loss, unintentional 04/25/2017  . B12 deficiency 01/26/2017  . Encounter for preventive health examination 03/14/2015  . Medicare annual wellness visit, subsequent 03/01/2014  . History of colonic polyps 11/02/2012  . FH: colonic polyps 04/29/2012  . OSA (obstructive sleep apnea) 01/28/2012  . Prostate cancer screening 01/20/2011  . Obesity (BMI 30.0-34.9) 01/20/2011  . Type 2 DM with diabetic neuropathy affecting both sides of body ([place])   . Mixed hyperlipidemia due to type 2 diabetes mellitus ([place])   . Hypertension     No flowsheet data found. BMP Latest Ref Rng & Units 02/11/2020 07/24/2019 06/27/2018  Glucose 70 - 99 mg/dL 109(H) 120(H) 130(H)  BUN 6 - 23 mg/dL _0 Creatinine 0.40 - 1.50 mg/dL 1.03 0.99 1.01  Sodium 135 - 145 mEq/L 140 138 139  Potassium 3.5 - 5.1 mEq/L 4.7 5.1 4.2  Chloride 96 - 112 mEq/L 106 103 105  CO2 19 - 32 mEq/L _1 Calcium 8.4 - 10.5 mg/dL 10.5 10.0 9.4    Risks and benefits of anesthesia discussed at length, patient or surrogate demonstrates understanding. Appropriately NPO. Plan to proceed with anesthesia. Discussed "new onset afib" at length. Patient demonstrates understanding, wishes to proceed, and will follow up with PCP.  Champ Mungo, MD 12/08/20

## 2020-12-08 NOTE — Telephone Encounter (Signed)
FYI

## 2020-12-08 NOTE — Transfer of Care (Signed)
Immediate Anesthesia Transfer of Care Note  Patient: [name] [name]  Procedure(s) Performed: CATARACT EXTRACTION PHACO AND INTRAOCULAR LENS PLACEMENT (IOC) LEFT DIABETIC (Left: Eye)  Patient Location: PACU  Anesthesia Type: MAC  Level of Consciousness: awake, alert  and patient cooperative  Airway and Oxygen Therapy: Patient Spontanous Breathing and Patient connected to supplemental oxygen  Post-op Assessment: Post-op Vital signs reviewed, Patient's Cardiovascular Status Stable, Respiratory Function Stable, Patent Airway and No signs of Nausea or vomiting  Post-op Vital Signs: Reviewed and stable  Complications: No notable events documented.

## 2020-12-08 NOTE — Op Note (Signed)
OPERATIVE NOTE  [name] [name] [id number] 12/08/2020   PREOPERATIVE DIAGNOSIS:  Nuclear sclerotic cataract left eye.  H25.12   POSTOPERATIVE DIAGNOSIS:    Nuclear sclerotic cataract left eye.     PROCEDURE:  Phacoemusification with posterior chamber intraocular lens placement of the left eye   LENS:   Implant Name Type Inv. Item Serial No. Manufacturer Lot No. LRB No. Used Action  LENS IOL TECNIS EYHANCE 22.5 - [id number] Intraocular Lens LENS IOL TECNIS EYHANCE 22.5 [id number] JOHNSON   Left 1 Implanted      Procedure(s) with comments: CATARACT EXTRACTION PHACO AND INTRAOCULAR LENS PLACEMENT (IOC) LEFT DIABETIC (Left) - 8.81 00:44.4  DIB00 +22.5   ULTRASOUND TIME: 0 minutes 44 seconds.  CDE 8.81   SURGEON:  Benay Pillow, MD, MPH   ANESTHESIA:  Topical with tetracaine drops augmented with 1% preservative-free intracameral lidocaine.  ESTIMATED BLOOD LOSS: <1 mL   COMPLICATIONS:  None.   DESCRIPTION OF PROCEDURE:  The patient was identified in the holding room and transported to the operating room and placed in the supine position under the operating microscope.  The left eye was identified as the operative eye and it was prepped and draped in the usual sterile ophthalmic fashion.   A 1.0 millimeter clear-corneal paracentesis was made at the 5:00 position. 0.5 ml of preservative-free 1% lidocaine with epinephrine was injected into the anterior chamber.  The anterior chamber was filled with Healon 5 viscoelastic.  A 2.4 millimeter keratome was used to make a near-clear corneal incision at the 2:00 position.  A curvilinear capsulorrhexis was made with a cystotome and capsulorrhexis forceps.  Balanced salt solution was used to hydrodissect and hydrodelineate the nucleus.   Phacoemulsification was then used in stop and chop fashion to remove the lens nucleus and epinucleus.  The remaining cortex was then removed using the irrigation and aspiration handpiece. Healon was then placed  into the capsular bag to distend it for lens placement.  A lens was then injected into the capsular bag.  The remaining viscoelastic was aspirated.   Wounds were hydrated with balanced salt solution.  The anterior chamber was inflated to a physiologic pressure with balanced salt solution.  Intracameral cefuroxime 0.1 mL at 10 mg/mL was injected into the eye.  No wound leaks were noted.  The patient was taken to the recovery room in stable condition without complications of anesthesia or surgery  Benay Pillow 12/08/2020, 8:30 AM

## 2020-12-08 NOTE — H&P (Signed)
[facility]   Primary Care Physician:  Crecencio Mc, MD Ophthalmologist: Dr. Benay Pillow  Pre-Procedure History & Physical: HPI:  [name] [name] is a 80 y.o. male here for cataract surgery.   Past Medical History:  Diagnosis Date   Diabetes mellitus    Type 2   Hyperlipidemia    Hypertension    Peripheral neuropathy 01/19/2007   positive EMG studies, negative workup for causes   Positive PPD, treated 01/19/1968    Past Surgical History:  Procedure Laterality Date   CATARACT EXTRACTION W/PHACO Right 11/24/2020   Procedure: CATARACT EXTRACTION PHACO AND INTRAOCULAR LENS PLACEMENT ([place]) RIGHT DIABETIC 4.17 00:33.8;  Surgeon: Eulogio Bear, MD;  Location: [facility];  Service: Ophthalmology;  Laterality: Right;   COLONOSCOPY  2004, 2014   Dr Bary Castilla   COLONOSCOPY WITH PROPOFOL N/A 04/30/2015   Procedure: COLONOSCOPY WITH PROPOFOL;  Surgeon: Robert Bellow, MD;  Location: [facility] ENDOSCOPY;  Service: Endoscopy;  Laterality: N/A;   ROTATOR CUFF REPAIR  2005   right (Dr. Francia Greaves)   TONSILLECTOMY     WRIST FRACTURE SURGERY Left    Armour, Ted     Prior to Admission medications   Medication Sig Start Date End Date Taking? Authorizing Provider  amLODipine (NORVASC) 10 MG tablet TAKE 1 TABLET BY MOUTH DAILY 06/19/20  Yes Crecencio Mc, MD  aspirin 81 MG tablet Take 81 mg by mouth daily.   Yes [provider]  Cholecalciferol (D3-1000) 25 MCG (1000 UT) capsule Take 1,000 Units by mouth daily.   Yes [provider]  Choline Fenofibrate (FENOFIBRIC ACID) 135 MG CPDR TAKE 1 CAPSULE BY MOUTH EVERY DAY. 10/01/20  Yes Crecencio Mc, MD  co-enzyme Q-10 30 MG capsule Take 30 mg by mouth daily.   Yes [provider]  cyanocobalamin (,VITAMIN B-12,) 1000 MCG/ML injection INJECT 1 ML INTO THE MUSCLE EVERY 30 DAYS AS DIRECTED 06/30/20  Yes Crecencio Mc, MD  fish oil-omega-3 fatty acids 1000 MG capsule Take 2 g by mouth daily.   Yes [provider]  glipiZIDE (GLUCOTROL) 5 MG tablet TAKE ONE-HALF (1/2) TABLET BY MOUTH TWICE DAILY WITH MEALS 06/19/20  Yes Crecencio Mc, MD  metFORMIN (GLUCOPHAGE) 1000 MG tablet TAKE ONE TABLET TWICE A DAY WITH MEALS 09/17/20  Yes Crecencio Mc, MD  Multiple Vitamin (MULTIVITAMIN) tablet Take 1 tablet by mouth daily.   Yes [provider]  rosuvastatin (CRESTOR) 40 MG tablet TAKE 1 TABLET BY MOUTH EVERY EVENING. 03/18/20  Yes Crecencio Mc, MD  telmisartan (MICARDIS) 40 MG tablet TAKE 1 TABLET BY MOUTH AT BEDTIME 06/19/20  Yes Crecencio Mc, MD  B-D 3CC LUER-LOK SYR 25GX1" 25G X 1" 3 ML MISC USE AS DIRECTED FOR B12 INJECTIONS 06/20/19   Crecencio Mc, MD  NEEDLE, DISP, 25 G 75I X 1" MISC 1 application by Does not apply route every 30 (thirty) days. 03/17/17   Einar Pheasant, MD    Allergies as of 10/13/2020 - Review Complete 06/09/2020  Allergen Reaction Noted   Neosporin [neomycin-bacitracin zn-polymyx] Rash 04/30/2015   Polysporin [bacitracin-polymyxin b] Rash 04/30/2015    Family History  Problem Relation Age of Onset   Heart attack Father    Dementia Sister    Kidney disease Sister    Cancer Neg Hx     Social History   Socioeconomic History   Marital status: Married    Spouse name: Not on file   Number of children:  Not on file   Years of education: Not on file   Highest education level: Not on file  Occupational History   Not on file  Tobacco Use   Smoking status: Former    Types: Cigarettes    Quit date: 10/18/1980    Years since quitting: 40.1   Smokeless tobacco: Former    Types: Chew    Quit date: 10/19/1990  Substance and Sexual Activity   Alcohol use: Never   Drug use: No   Sexual activity: Not Currently  Other Topics Concern   Not on file  Social History Narrative   Not on file   Social Determinants of Health   Financial Resource Strain: Low Risk    Difficulty of Paying Living Expenses: Not hard at all  Food Insecurity: No Food Insecurity    Worried About Charity fundraiser in the Last Year: Never true   [place] in the Last Year: Never true  Transportation Needs: No Transportation Needs   Lack of Transportation (Medical): No   Lack of Transportation (Non-Medical): No  Physical Activity: Sufficiently Active   Days of Exercise per Week: 3 days   Minutes of Exercise per Session: 60 min  Stress: No Stress Concern Present   Feeling of Stress : Not at all  Social Connections: Unknown   Frequency of Communication with Friends and Family: Not on file   Frequency of Social Gatherings with Friends and Family: Not on file   Attends Religious Services: Not on Electrical engineer or Organizations: Not on file   Attends Archivist Meetings: Not on file   Marital Status: Married  Human resources officer Violence: Not At Risk   Fear of Current or Ex-Partner: No   Emotionally Abused: No   Physically Abused: No   Sexually Abused: No    Review of Systems: See HPI, otherwise negative ROS  Physical Exam: BP (!) 141/74   Pulse 93   Temp 97.9 F (36.6 C) (Temporal)   Ht 5\' 10"  (1.778 m)   Wt 89.1 kg   SpO2 96%   BMI 28.18 kg/m  General:   Alert, cooperative in NAD Head:  Normocephalic and atraumatic. Respiratory:  Normal work of breathing. Cardiovascular:  RRR  Impression/Plan: [name] [name] is here for cataract surgery.  Risks, benefits, limitations, and alternatives regarding cataract surgery have been reviewed with the patient.  Questions have been answered.  All parties agreeable.   Benay Pillow, MD  12/08/2020, 8:02 AM

## 2020-12-09 ENCOUNTER — Encounter: Payer: Self-pay | Admitting: Ophthalmology

## 2020-12-09 ENCOUNTER — Other Ambulatory Visit: Payer: Self-pay

## 2020-12-09 ENCOUNTER — Ambulatory Visit: Payer: Medicare PPO | Admitting: Adult Health

## 2020-12-09 ENCOUNTER — Ambulatory Visit (INDEPENDENT_AMBULATORY_CARE_PROVIDER_SITE_OTHER): Payer: Medicare PPO

## 2020-12-09 VITALS — BP 138/64 | HR 94 | Temp 96.9°F | Ht 70.0 in | Wt 200.6 lb

## 2020-12-09 DIAGNOSIS — E782 Mixed hyperlipidemia: Secondary | ICD-10-CM | POA: Diagnosis not present

## 2020-12-09 DIAGNOSIS — E1142 Type 2 diabetes mellitus with diabetic polyneuropathy: Secondary | ICD-10-CM

## 2020-12-09 DIAGNOSIS — E1169 Type 2 diabetes mellitus with other specified complication: Secondary | ICD-10-CM

## 2020-12-09 DIAGNOSIS — G4733 Obstructive sleep apnea (adult) (pediatric): Secondary | ICD-10-CM

## 2020-12-09 DIAGNOSIS — I10 Essential (primary) hypertension: Secondary | ICD-10-CM

## 2020-12-09 DIAGNOSIS — I4891 Unspecified atrial fibrillation: Secondary | ICD-10-CM

## 2020-12-09 DIAGNOSIS — R9431 Abnormal electrocardiogram [ECG] [EKG]: Secondary | ICD-10-CM | POA: Diagnosis not present

## 2020-12-09 DIAGNOSIS — I471 Supraventricular tachycardia: Secondary | ICD-10-CM | POA: Insufficient documentation

## 2020-12-09 LAB — TROPONIN I (HIGH SENSITIVITY): High Sens Troponin I: 9 ng/L (ref 2–17)

## 2020-12-09 MED ORDER — CARVEDILOL 3.125 MG PO TABS: 3.1250 mg | ORAL_TABLET | Freq: Two times a day (BID) | ORAL | 0 refills | Status: DC

## 2020-12-09 NOTE — Progress Notes (Signed)
Acute Office Visit  Subjective:    Patient ID: [name] [name], male    DOB: 11/03/1940, 80 y.o.   MRN: [id number]  Chief Complaint  Patient presents with   Atrial Fibrillation    HPI Patient is in today for follow up to EKG on 12/08/20 he was found to be in atrial fibrillation.  He denies any palpitations, chest pain or shortness of breath. Denies any edema. He has been active- he usually walks but has not lately not due to any symptoms. He reprts he " can do anything he wants to do". Mows his own yard.  He denies ever having atrial fibrillation in the past.  He denies feeling any palpitations, fatigue or shortness of breath.  Patient reports he did see a cardiologist many years ago, I do not see that in epic at this time.  He reports work-up was within normal limits He is currently on Norvasc 10 mg once daily as well as myocardis 40 mg at bedtime. Denies any bleeding. Denies any headache.  He takes baby aspirin daily.  He has been on Norvasc and Micardis or blood pressure.  Patient was seen by ophthalmology Dr. Edison [name] yesterday 12/09/2018 due for cataract surgery for fibrillation and told to follow-up with his primary care provider today.  He is here for that follow-up.  Denies any new medications or over the counter medications.  Denies lightheaded or dizziness.    He has a history of mixed hyperlipidemia due to type 2 diabetes mellitus.  B12 deficiency.  Diabetic neuropathy affecting both sides of the body.  Obesity.  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  B12 deficiency is managed with monthly IM injections of B12.  He had a ESI in November 2021 for his sciatica and he wears braces for foot drop.  Followed by orthopedics.  Patient  denies any fever, body aches,chills, rash, chest pain, shortness of breath, nausea, vomiting, or diarrhea.   Past Medical History:  Diagnosis Date   Diabetes mellitus    Type 2   Hyperlipidemia    Hypertension    Peripheral  neuropathy 01/19/2007   positive EMG studies, negative workup for causes   Positive PPD, treated 01/19/1968    Past Surgical History:  Procedure Laterality Date   CATARACT EXTRACTION W/PHACO Right 11/24/2020   Procedure: CATARACT EXTRACTION PHACO AND INTRAOCULAR LENS PLACEMENT ([facility]) RIGHT DIABETIC 4.17 00:33.8;  Surgeon: [name] Bear, MD;  Location: [facility];  Service: Ophthalmology;  Laterality: Right;   CATARACT EXTRACTION W/PHACO Left 12/08/2020   Procedure: CATARACT EXTRACTION PHACO AND INTRAOCULAR LENS PLACEMENT ([facility]) LEFT DIABETIC;  Surgeon: [name] Bear, MD;  Location: [facility];  Service: Ophthalmology;  Laterality: Left;  8.81 00:44.4   COLONOSCOPY  2004, 2014   Dr [name] [name]   COLONOSCOPY WITH PROPOFOL N/A 04/30/2015   Procedure: COLONOSCOPY WITH PROPOFOL;  Surgeon: [name] Bellow, MD;  Location: [facility] ENDOSCOPY;  Service: Endoscopy;  Laterality: N/A;   ROTATOR CUFF REPAIR  2005   right (Dr. Francia [name])   TONSILLECTOMY     WRIST FRACTURE SURGERY Left    [name], [name]     Family History  Problem Relation Age of Onset   Heart attack Father    Dementia Sister    Kidney disease Sister    Cancer Neg Hx     Social History   Socioeconomic History   Marital status: Married    Spouse name: Not on file   Number of children: Not on file  Years of education: Not on file   Highest education level: Not on file  Occupational History   Not on file  Tobacco Use   Smoking status: Former    Types: Cigarettes    Quit date: 10/18/1980    Years since quitting: 40.1   Smokeless tobacco: Former    Types: Chew    Quit date: 10/19/1990  Substance and Sexual Activity   Alcohol use: Never   Drug use: No   Sexual activity: Not Currently  Other Topics Concern   Not on file  Social History Narrative   Not on file   Social Determinants of Health   Financial Resource Strain: Low Risk    Difficulty of Paying Living Expenses: Not hard at all  Food  Insecurity: No Food Insecurity   Worried About Charity fundraiser in the Last Year: Never true   [place] in the Last Year: Never true  Transportation Needs: No Transportation Needs   Lack of Transportation (Medical): No   Lack of Transportation (Non-Medical): No  Physical Activity: Sufficiently Active   Days of Exercise per Week: 3 days   Minutes of Exercise per Session: 60 min  Stress: No Stress Concern Present   Feeling of Stress : Not at all  Social Connections: Unknown   Frequency of Communication with Friends and Family: Not on file   Frequency of Social Gatherings with Friends and Family: Not on file   Attends Religious Services: Not on Electrical engineer or Organizations: Not on file   Attends Archivist Meetings: Not on file   Marital Status: Married  Human resources officer Violence: Not At Risk   Fear of Current or Ex-Partner: No   Emotionally Abused: No   Physically Abused: No   Sexually Abused: No    Outpatient Medications Prior to Visit  Medication Sig Dispense Refill   aspirin 81 MG tablet Take 81 mg by mouth daily.     B-D 3CC LUER-LOK SYR 25GX1" 25G X 1" 3 ML MISC USE AS DIRECTED FOR B12 INJECTIONS 6 each 0   Cholecalciferol (D3-1000) 25 MCG (1000 UT) capsule Take 1,000 Units by mouth daily.     Choline Fenofibrate (FENOFIBRIC ACID) 135 MG CPDR TAKE 1 CAPSULE BY MOUTH EVERY DAY. 90 capsule 1   co-enzyme Q-10 30 MG capsule Take 30 mg by mouth daily.     cyanocobalamin (,VITAMIN B-12,) 1000 MCG/ML injection INJECT 1 ML INTO THE MUSCLE EVERY 30 DAYS AS DIRECTED 10 mL 1   fish oil-omega-3 fatty acids 1000 MG capsule Take 2 g by mouth daily.     glipiZIDE (GLUCOTROL) 5 MG tablet TAKE ONE-HALF (1/2) TABLET BY MOUTH TWICE DAILY WITH MEALS 90 tablet 1   metFORMIN (GLUCOPHAGE) 1000 MG tablet TAKE ONE TABLET TWICE A DAY WITH MEALS 60 tablet 2   Multiple Vitamin (MULTIVITAMIN) tablet Take 1 tablet by mouth daily.     NEEDLE, DISP, 25 G 25G X 1" MISC 1  application by Does not apply route every 30 (thirty) days. 6 each 0   rosuvastatin (CRESTOR) 40 MG tablet TAKE 1 TABLET BY MOUTH EVERY EVENING. 90 tablet 1   telmisartan (MICARDIS) 40 MG tablet TAKE 1 TABLET BY MOUTH AT BEDTIME 90 tablet 1   amLODipine (NORVASC) 10 MG tablet TAKE 1 TABLET BY MOUTH DAILY 90 tablet 1   No facility-administered medications prior to visit.    Allergies  Allergen Reactions   Neosporin [Neomycin-Bacitracin Zn-Polymyx] Rash  Polysporin [Bacitracin-Polymyxin B] Rash    Review of Systems  Constitutional: Negative.   HENT: Negative.    Respiratory: Negative.    Cardiovascular: Negative.   Gastrointestinal: Negative.   Genitourinary: Negative.   Musculoskeletal: Negative.   Neurological: Negative.   Psychiatric/Behavioral: Negative.        Objective:    Physical Exam Vitals reviewed.  Constitutional:      General: He is not in acute distress.    Appearance: Normal appearance. He is well-developed. He is obese. He is not ill-appearing, toxic-appearing or diaphoretic.  HENT:     Head: Normocephalic and atraumatic.     Right Ear: Hearing, tympanic membrane, ear canal and external ear normal.     Left Ear: Hearing, tympanic membrane, ear canal and external ear normal.     Nose: Nose normal.     Mouth/Throat:     Mouth: Mucous membranes are moist.     Pharynx: Uvula midline. No oropharyngeal exudate.  Eyes:     General: Lids are normal. No scleral icterus.       Right eye: No discharge.        Left eye: No discharge.     Conjunctiva/sclera: Conjunctivae normal.     Pupils: Pupils are equal, round, and reactive to light.  Neck:     Thyroid: No thyromegaly.     Vascular: Normal carotid pulses. No carotid bruit, hepatojugular reflux or JVD.     Trachea: Trachea and phonation normal. No tracheal tenderness or tracheal deviation.     Meningeal: Brudzinski's sign absent.  Cardiovascular:     Rate and Rhythm: Normal rate. Rhythm irregular.      Pulses: Normal pulses.     Heart sounds: Normal heart sounds, S1 normal and S2 normal. Heart sounds not distant. No murmur heard.   No friction rub. No gallop.  Pulmonary:     Effort: Pulmonary effort is normal. No accessory muscle usage or respiratory distress.     Breath sounds: Normal breath sounds. No stridor. No wheezing or rales.  Chest:     Chest wall: No tenderness.  Abdominal:     General: Bowel sounds are normal. There is no distension.     Palpations: Abdomen is soft. There is no mass.     Tenderness: There is no abdominal tenderness. There is no guarding or rebound.  Musculoskeletal:        General: No tenderness or deformity. Normal range of motion.     Cervical back: Full passive range of motion without pain, normal range of motion and neck supple. No rigidity or tenderness.     Right lower leg: Edema present.     Left lower leg: Edema present.     Comments: Trace bilateral lower extremity edema.  Patient is on norvasc, reports no new edema.   Lymphadenopathy:     Head:     Right side of head: No submental, submandibular, tonsillar, preauricular, posterior auricular or occipital adenopathy.     Left side of head: No submental, submandibular, tonsillar, preauricular, posterior auricular or occipital adenopathy.     Cervical: No cervical adenopathy.  Skin:    General: Skin is warm and dry.     Coloration: Skin is not pale.     Findings: No erythema or rash.     Nails: There is no clubbing.  Neurological:     Mental Status: He is alert and oriented to person, place, and time.     GCS: GCS eye subscore is 4. GCS  verbal subscore is 5. GCS motor subscore is 6.     Cranial Nerves: No cranial nerve deficit.     Sensory: No sensory deficit.     Motor: No weakness or abnormal muscle tone.     Coordination: Coordination normal.     Gait: Gait normal.     Deep Tendon Reflexes: Reflexes are normal and symmetric. Reflexes normal.  Psychiatric:        Mood and Affect: Mood  normal.        Speech: Speech normal.        Behavior: Behavior normal.        Thought Content: Thought content normal.        Judgment: Judgment normal.    BP 138/64   Pulse 94   Temp (!) 96.9 F (36.1 C)   Ht 5\' 10"  (1.778 m)   Wt 200 lb 9.6 oz (91 kg)   SpO2 95%   BMI 28.78 kg/m  Wt Readings from Last 3 Encounters:  12/09/20 200 lb 9.6 oz (91 kg)  12/08/20 196 lb 6.4 oz (89.1 kg)  11/24/20 197 lb (89.4 kg)    Health Maintenance Due  Topic Date Due   COLONOSCOPY (Pts 45-94yrs Insurance coverage will need to be confirmed)  04/29/2020   TETANUS/TDAP  06/28/2020   HEMOGLOBIN A1C  08/10/2020    There are no preventive care reminders to display for this patient.   Lab Results  Component Value Date   TSH 0.69 01/24/2017   No results found for: WBC, HGB, HCT, MCV, PLT Lab Results  Component Value Date   NA 140 02/11/2020   K 4.7 02/11/2020   CO2 27 02/11/2020   GLUCOSE 109 (H) 02/11/2020   BUN 22 02/11/2020   CREATININE 1.03 02/11/2020   BILITOT 0.4 02/11/2020   ALKPHOS 45 02/11/2020   AST 16 02/11/2020   ALT 16 02/11/2020   PROT 6.6 02/11/2020   ALBUMIN 4.4 02/11/2020   CALCIUM 10.5 02/11/2020   GFR 69.93 02/11/2020   Lab Results  Component Value Date   CHOL 120 02/11/2020   Lab Results  Component Value Date   HDL 39.40 02/11/2020   Lab Results  Component Value Date   LDLCALC 55 02/11/2020   Lab Results  Component Value Date   TRIG 129.0 02/11/2020   Lab Results  Component Value Date   CHOLHDL 3 02/11/2020   Lab Results  Component Value Date   HGBA1C 7.1 (H) 02/11/2020       Assessment & Plan:   Problem List Items Addressed This Visit       Cardiovascular and Mediastinum   Hypertension   Relevant Medications   carvedilol (COREG) 3.125 MG tablet   Atrial fibrillation (HCC) - Primary   Relevant Medications   carvedilol (COREG) 3.125 MG tablet   Other Relevant Orders   CBC with Differential/Platelet   Comprehensive metabolic panel    Thyroid Panel With TSH   EKG 12-Lead (Completed)   DG Chest 2 View   Ambulatory referral to Cardiology   CK total and CKMB (cardiac)not at [facility]   Troponin I (High Sensitivity) (Completed)     Respiratory   OSA (obstructive sleep apnea)     Endocrine   Type 2 DM with diabetic neuropathy affecting both sides of body (HCC)   Mixed hyperlipidemia due to type 2 diabetes mellitus (HCC)   Relevant Medications   carvedilol (COREG) 3.125 MG tablet     Other   Abnormal electrocardiogram (ECG) (EKG)  Medications Discontinued During This Encounter  Medication Reason   amLODipine (NORVASC) 10 MG tablet Completed Course     Meds ordered this encounter  Medications   carvedilol (COREG) 3.125 MG tablet    Sig: Take 1 tablet (3.125 mg total) by mouth 2 (two) times daily with a meal.    Dispense:  60 tablet    Refill:  0    EKG shows sinus rhythm today in office rate 83,  Sinus  Rhythm  -Nonspecific QRS widening and anterior fascicular block.   -Poor R-wave progression -nonspecific -consider old anterior infarct.    EKG on 12/08/20 at opthalmology for cataract surgery shows atrial fibrillation. See EKG .   Urgent referral to cardiology, patient is asymptomatic at this time. He will remain on 81 mg asa he is currently taking and cleared by opthalmology given recent cataract surgery.  Labs today and chest x ray. Will let cardiology do further evaluation/ work up and defer further anticoagulant to cardiology judgement.  Should hear from cardiology referral  within 2 weeks.   Discussed plan of care  and reviewed EKG with supervising physician Deborra Medina MD who is in agreement with plan of care. Strict go to the emergency room precautions were given.  Red Flags discussed. The patient was given clear instructions to go to ER or return to medical center if any red flags develop, symptoms do not improve, worsen or new problems develop. They verbalized understanding.  Return in about 2 weeks  (around 12/23/2020), or if symptoms worsen or fail to improve, for at any time for any worsening symptoms, Go to Emergency room/ urgent care if worse.   Marcille Buffy, FNP

## 2020-12-09 NOTE — Progress Notes (Signed)
Troponin cardiac enzyme negative.

## 2020-12-09 NOTE — Progress Notes (Signed)
EKG- abnormal-  reviewed in office with you today -  patient referred to cardiology urgent  for further work up. Discontinued Norvasc and carvedilol 3.125 mg BID was started. He is on 81 mg aspirin daily and was cleared by opthalmology to continue, cataract surgery was 12/09/20 where he was found to be in atrial fibrillation. Labs in office today. Red flags and strict go to ER if worsening discussed. Patient is asymptomatic.

## 2020-12-09 NOTE — Patient Instructions (Signed)
Atrial Fibrillation Atrial fibrillation is a type of irregular or rapid heartbeat (arrhythmia). In atrial fibrillation, the top part of the heart (atria) beats in an irregular pattern. This makes the heart unable to pump blood normally and effectively. The goal of treatment is to prevent blood clots from forming, control your heart rate, or restore your heartbeat to a normal rhythm. If this condition is not treated, it can cause serious problems, such as a weakened heart muscle (cardiomyopathy) or a stroke. What are the causes? This condition is often caused by medical conditions that damage the heart's electrical system. These include: High blood pressure (hypertension). This is the most common cause. Certain heart problems or conditions, such as heart failure, coronary artery disease, heart valve problems, or heart surgery. Diabetes. Overactive thyroid (hyperthyroidism). Obesity. Chronic kidney disease. In some cases, the cause of this condition is not known. What increases the risk? This condition is more likely to develop in: Older people. People who smoke. Athletes who do endurance exercise. People who have a family history of atrial fibrillation. Men. People who use drugs. People who drink a lot of alcohol. People who have lung conditions, such as emphysema, pneumonia, or COPD. People who have obstructive sleep apnea. What are the signs or symptoms? Symptoms of this condition include: A feeling that your heart is racing or beating irregularly. Discomfort or pain in your chest. Shortness of breath. Sudden light-headedness or weakness. Tiring easily during exercise or activity. Fatigue. Syncope (fainting). Sweating. In some cases, there are no symptoms. How is this diagnosed? Your health care provider may detect atrial fibrillation when taking your pulse. If detected, this condition may be diagnosed with: An electrocardiogram (ECG) to check electrical signals of the heart. An  ambulatory cardiac monitor to record your heart's activity for a few days. A transthoracic echocardiogram (TTE) to create pictures of your heart. A transesophageal echocardiogram (TEE) to create even closer pictures of your heart. A stress test to check your blood supply while you exercise. Imaging tests, such as a CT scan or chest X-ray. Blood tests. How is this treated? Treatment depends on underlying conditions and how you feel when you experience atrial fibrillation. This condition may be treated with: Medicines to prevent blood clots or to treat heart rate or heart rhythm problems. Electrical cardioversion to reset the heart's rhythm. A pacemaker to correct abnormal heart rhythm. Ablation to remove the heart tissue that sends abnormal signals. Left atrial appendage closure to seal the area where blood clots can form. In some cases, underlying conditions will be treated. Follow these instructions at home: Medicines Take over-the counter and prescription medicines only as told by your health care provider. Do not take any new medicines without talking to your health care provider. If you are taking blood thinners: Talk with your health care provider before you take any medicines that contain aspirin or NSAIDs, such as ibuprofen. These medicines increase your risk for dangerous bleeding. Take your medicine exactly as told, at the same time every day. Avoid activities that could cause injury or bruising, and follow instructions about how to prevent falls. Wear a medical alert bracelet or carry a card that lists what medicines you take. Lifestyle   Do not use any products that contain nicotine or tobacco, such as cigarettes, e-cigarettes, and chewing tobacco. If you need help quitting, ask your health care provider. Eat heart-healthy foods. Talk with a dietitian to make an eating plan that is right for you. Exercise regularly as told by your health  care provider. Do not drink  alcohol. Lose weight if you are overweight. Do not use drugs, including cannabis. General instructions If you have obstructive sleep apnea, manage your condition as told by your health care provider. Do not use diet pills unless your health care provider approves. Diet pills can make heart problems worse. Keep all follow-up visits as told by your health care provider. This is important. Contact a health care provider if you: Notice a change in the rate, rhythm, or strength of your heartbeat. Are taking a blood thinner and you notice more bruising. Tire more easily when you exercise or do heavy work. Have a sudden change in weight. Get help right away if you have:  Chest pain, abdominal pain, sweating, or weakness. Trouble breathing. Side effects of blood thinners, such as blood in your vomit, stool, or urine, or bleeding that cannot stop. Any symptoms of a stroke. "BE FAST" is an easy way to remember the main warning signs of a stroke: B - Balance. Signs are dizziness, sudden trouble walking, or loss of balance. E - Eyes. Signs are trouble seeing or a sudden change in vision. F - Face. Signs are sudden weakness or numbness of the face, or the face or eyelid drooping on one side. A - Arms. Signs are weakness or numbness in an arm. This happens suddenly and usually on one side of the body. S - Speech. Signs are sudden trouble speaking, slurred speech, or trouble understanding what people say. T - Time. Time to call emergency services. Write down what time symptoms started. Other signs of a stroke, such as: A sudden, severe headache with no known cause. Nausea or vomiting. Seizure. These symptoms may represent a serious problem that is an emergency. Do not wait to see if the symptoms will go away. Get medical help right away. Call your local emergency services (911 in the [place] Do not drive yourself to the hospital. Summary Atrial fibrillation is a type of irregular or rapid heartbeat  (arrhythmia). Symptoms include a feeling that your heart is beating fast or irregularly. You may be given medicines to prevent blood clots or to treat heart rate or heart rhythm problems. Get help right away if you have signs or symptoms of a stroke. Get help right away if you cannot catch your breath or have chest pain or pressure. This information is not intended to replace advice given to you by your health care provider. Make sure you discuss any questions you have with your health care provider. Document Revised: 06/28/2018 Document Reviewed: 06/28/2018 Elsevier Patient Education  [place]. Carvedilol Tablets What is this medication? CARVEDILOL (KAR ve dil ol) treats high blood pressure and heart failure. It may also be used to prevent further damage after a heart attack. It works by lowering your blood pressure and heart rate, making it easier for your heart to pump blood to the rest of your body. It belongs to a group of medications called beta blockers. This medicine may be used for other purposes; ask your health care provider or pharmacist if you have questions. COMMON BRAND NAME(S): Coreg What should I tell my care team before I take this medication? They need to know if you have any of these conditions: Circulation problems Diabetes History of heart attack or heart disease Liver disease Lung or breathing disease, like asthma Pheochromocytoma Slow or irregular heartbeat Thyroid disease An unusual or allergic reaction to carvedilol, other beta blockers, medications, foods, dyes, or preservatives Pregnant or trying  to get pregnant Breast-feeding How should I use this medication? Take this medication by mouth. Take it as directed on the prescription label at the same time every day. Take it with food. Keep taking it unless your care team tells you to stop. Talk to your care team about the use of this medication in children. Special care may be needed. Overdosage: If you  think you have taken too much of this medicine contact a poison control center or emergency room at once. NOTE: This medicine is only for you. Do not share this medicine with others. What if I miss a dose? If you miss a dose, take it as soon as you can. If it is almost time for your next dose, take only that dose. Do not take double or extra doses. What may interact with this medication? This medication may interact with the following: Certain medications for blood pressure, heart disease, irregular heartbeat Certain medications for depression, like fluoxetine or paroxetine Certain medications for diabetes, like glipizide or glyburide Cimetidine Clonidine Cyclosporine Digoxin MAOIs like Carbex, Eldepryl, Marplan, Nardil, and Parnate Reserpine Rifampin This list may not describe all possible interactions. Give your health care provider a list of all the medicines, herbs, non-prescription drugs, or dietary supplements you use. Also tell them if you smoke, drink alcohol, or use illegal drugs. Some items may interact with your medicine. What should I watch for while using this medication? Visit your care team for regular checks on your progress. Check your blood pressure as directed. Ask your care team what your blood pressure should be. Also, find out when you should contact them. Do not treat yourself for coughs, colds, or pain while you are using this medication without asking your care team for advice. Some medications may increase your blood pressure. You may get drowsy or dizzy. Do not drive, use machinery, or do anything that needs mental alertness until you know how this medication affects you. Do not stand up or sit up quickly, especially if you are an older patient. This reduces the risk of dizzy or fainting spells. Alcohol may interfere with the effect of this medication. Avoid alcoholic drinks. This medication may increase blood sugar. Ask your care team if changes in diet or medications  are needed if you have diabetes. If you are going to need surgery or another procedure, tell your care team that you are using this medication. What side effects may I notice from receiving this medication? Side effects that you should report to your care team as soon as possible: Allergic reactions--skin rash, itching, hives, swelling of the face, lips, tongue, or throat Heart failure--shortness of breath, swelling of the ankles, feet, or hands, sudden weight gain, unusual weakness or fatigue Low blood pressure--dizziness, feeling faint or lightheaded, blurry vision Raynaud's--cool, numb, or painful fingers or toes that may change color from pale, to blue, to red Slow heartbeat--dizziness, feeling faint or lightheaded, confusion, trouble breathing, unusual weakness or fatigue Worsening mood, feelings of depression Side effects that usually do not require medical attention (report to your care team if they continue or are bothersome): Change in sex drive or performance Diarrhea Dizziness Fatigue Headache This list may not describe all possible side effects. Call your doctor for medical advice about side effects. You may report side effects to FDA at 1-800-FDA-1088. Where should I keep my medication? Keep out of the reach of children and pets. Store at room temperature between 20 and 25 degrees C (68 and 77 degrees F). Protect from  moisture. Keep the container tightly closed. Throw away any unused medication after the expiration date. NOTE: This sheet is a summary. It may not cover all possible information. If you have questions about this medicine, talk to your doctor, pharmacist, or health care provider.  2022 Elsevier/Gold Standard (2020-02-29 00:00:00)

## 2020-12-10 LAB — COMPREHENSIVE METABOLIC PANEL
ALT: 16 U/L (ref 0–53)
AST: 17 U/L (ref 0–37)
Albumin: 4.2 g/dL (ref 3.5–5.2)
Alkaline Phosphatase: 69 U/L (ref 39–117)
BUN: 15 mg/dL (ref 6–23)
CO2: 27 mEq/L (ref 19–32)
Calcium: 9.3 mg/dL (ref 8.4–10.5)
Chloride: 101 mEq/L (ref 96–112)
Creatinine, Ser: 1.15 mg/dL (ref 0.40–1.50)
GFR: 60.91 mL/min (ref >60.00)
Glucose, Bld: 257 mg/dL — ABNORMAL HIGH (ref 70–99)
Potassium: 3.9 mEq/L (ref 3.5–5.1)
Sodium: 135 mEq/L (ref 135–145)
Total Bilirubin: 0.3 mg/dL (ref 0.2–1.2)
Total Protein: 6.8 g/dL (ref 6.0–8.3)

## 2020-12-10 LAB — THYROID PANEL WITH TSH
Free Thyroxine Index: 2.9 (ref 1.4–3.8)
T3 Uptake: 35 % (ref 22–35)
T4, Total: 8.4 ug/dL (ref 4.9–10.5)
TSH: 0.56 mIU/L (ref 0.40–4.50)

## 2020-12-10 LAB — CK TOTAL AND CKMB (NOT AT ARMC)
CK, MB: 0.8 ng/mL (ref 0–5.0)
Relative Index: 2.1 (ref 0–4.0)
Total CK: 39 U/L — ABNORMAL LOW (ref 44–196)

## 2020-12-10 LAB — CBC WITH DIFFERENTIAL/PLATELET
Basophils Absolute: 0.1 10*3/uL (ref 0.0–0.1)
Basophils Relative: 1.2 % (ref 0.0–3.0)
Eosinophils Absolute: 0.2 10*3/uL (ref 0.0–0.7)
Eosinophils Relative: 3.1 % (ref 0.0–5.0)
HCT: 41.2 % (ref 39.0–52.0)
Hemoglobin: 13.7 g/dL (ref 13.0–17.0)
Lymphocytes Relative: 23.4 % (ref 12.0–46.0)
Lymphs Abs: 1.8 10*3/uL (ref 0.7–4.0)
MCHC: 33.3 g/dL (ref 30.0–36.0)
MCV: 85.8 fl (ref 78.0–100.0)
Monocytes Absolute: 0.7 10*3/uL (ref 0.1–1.0)
Monocytes Relative: 9.4 % (ref 3.0–12.0)
Neutro Abs: 4.7 10*3/uL (ref 1.4–7.7)
Neutrophils Relative %: 62.9 % (ref 43.0–77.0)
Platelets: 278 10*3/uL (ref 150.0–400.0)
RBC: 4.81 Mil/uL (ref 4.22–5.81)
RDW: 14.9 % (ref 11.5–15.5)
WBC: 7.5 10*3/uL (ref 4.0–10.5)

## 2020-12-17 ENCOUNTER — Other Ambulatory Visit: Payer: Self-pay | Admitting: *Deleted

## 2020-12-17 DIAGNOSIS — R5383 Other fatigue: Secondary | ICD-10-CM

## 2020-12-19 ENCOUNTER — Other Ambulatory Visit: Payer: Self-pay

## 2020-12-19 ENCOUNTER — Encounter: Payer: Self-pay | Admitting: Internal Medicine

## 2020-12-19 ENCOUNTER — Ambulatory Visit (INDEPENDENT_AMBULATORY_CARE_PROVIDER_SITE_OTHER): Payer: Medicare PPO

## 2020-12-19 ENCOUNTER — Ambulatory Visit (INDEPENDENT_AMBULATORY_CARE_PROVIDER_SITE_OTHER): Payer: Medicare PPO | Admitting: Internal Medicine

## 2020-12-19 VITALS — BP 150/70 | HR 82 | Ht 70.0 in | Wt 197.0 lb

## 2020-12-19 DIAGNOSIS — I10 Essential (primary) hypertension: Secondary | ICD-10-CM | POA: Diagnosis not present

## 2020-12-19 DIAGNOSIS — E785 Hyperlipidemia, unspecified: Secondary | ICD-10-CM | POA: Diagnosis not present

## 2020-12-19 DIAGNOSIS — I491 Atrial premature depolarization: Secondary | ICD-10-CM | POA: Insufficient documentation

## 2020-12-19 DIAGNOSIS — I480 Paroxysmal atrial fibrillation: Secondary | ICD-10-CM

## 2020-12-19 DIAGNOSIS — E1169 Type 2 diabetes mellitus with other specified complication: Secondary | ICD-10-CM

## 2020-12-19 NOTE — Progress Notes (Signed)
New Outpatient Visit Date: 12/19/2020  Referring Provider: Doreen Beam, FNP [address] [place],  [place] [address]  Chief Complaint: Atrial fibrillation  HPI:  [name] [name] is a 80 y.o. male who is being seen today for the evaluation of atrial fibrillation at the request of Ms. [name]. He has a history of hypertension, hyperlipidemia, type 2 diabetes mellitus, and peripheral neuropathy.  He underwent cataract surgery last month (12/08/2020) and was noted to have an irregular heartbeat.  EKG was interpreted as atrial fibrillation.  He was seen for evaluation by Ms. [name] the next day, at which time he was asymptomatic.  Labs were unremarkable except for glucose of 257.  Carvedilol was prescribed at that visit, though [name] [name] has yet to start this.  Today, [name] [name] reports that he is feeling well.  He denies chest pain, shortness of breath, palpitations, lightheadedness, and edema.  He has seen multiple cardiologists over the years, most recently Dr. Nehemiah [name] greater than 10 years ago.  He believes he had a stress test at that time that was normal.  He does not recall any specific symptoms that led to his cardiology consultations.  --------------------------------------------------------------------------------------------------  Cardiovascular History & Procedures: Cardiovascular Problems: Questionable atrial fibrillation  Risk Factors: Hypertension, hyperlipidemia, diabetes mellitus, male gender, age greater than 68, and prior tobacco use  Cath/PCI: None  CV Surgery: None  EP Procedures and Devices: None  Non-Invasive Evaluation(s): None available  Recent CV Pertinent Labs: Lab Results  Component Value Date   CHOL 120 02/11/2020   HDL 39.40 02/11/2020   LDLCALC 55 02/11/2020   LDLDIRECT 71.0 04/25/2017   TRIG 129.0 02/11/2020   CHOLHDL 3 02/11/2020   K 3.9 12/09/2020   BUN 15 12/09/2020   CREATININE 1.15 12/09/2020   CREATININE 1.14  05/20/2011    --------------------------------------------------------------------------------------------------  Past Medical History:  Diagnosis Date   Diabetes mellitus    Type 2   Hyperlipidemia    Hypertension    Peripheral neuropathy 01/19/2007   positive EMG studies, negative workup for causes   Positive PPD, treated 01/19/1968    Past Surgical History:  Procedure Laterality Date   CATARACT EXTRACTION W/PHACO Right 11/24/2020   Procedure: CATARACT EXTRACTION PHACO AND INTRAOCULAR LENS PLACEMENT ([place]) RIGHT DIABETIC 4.17 00:33.8;  Surgeon: Eulogio Bear, MD;  Location: [facility];  Service: Ophthalmology;  Laterality: Right;   CATARACT EXTRACTION W/PHACO Left 12/08/2020   Procedure: CATARACT EXTRACTION PHACO AND INTRAOCULAR LENS PLACEMENT ([place]) LEFT DIABETIC;  Surgeon: Eulogio Bear, MD;  Location: [facility];  Service: Ophthalmology;  Laterality: Left;  8.81 00:44.4   COLONOSCOPY  2004, 2014   Dr Bary Castilla   COLONOSCOPY WITH PROPOFOL N/A 04/30/2015   Procedure: COLONOSCOPY WITH PROPOFOL;  Surgeon: Robert Bellow, MD;  Location[facility] ENDOSCOPY;  Service: Endoscopy;  Laterality: N/A;   ROTATOR CUFF REPAIR  2005   right (Dr. Francia Greaves)   TONSILLECTOMY     WRIST FRACTURE SURGERY Left    Armour, Ted     Current Meds  Medication Sig   aspirin 81 MG tablet Take 81 mg by mouth daily.   B-D 3CC LUER-LOK SYR 25GX1" 25G X 1" 3 ML MISC USE AS DIRECTED FOR B12 INJECTIONS   Cholecalciferol (D3-1000) 25 MCG (1000 UT) capsule Take 1,000 Units by mouth daily.   Choline Fenofibrate (FENOFIBRIC ACID) 135 MG CPDR TAKE 1 CAPSULE BY MOUTH EVERY DAY.   co-enzyme Q-10 30 MG capsule Take 30 mg by mouth daily.  cyanocobalamin (,VITAMIN B-12,) 1000 MCG/ML injection INJECT 1 ML INTO THE MUSCLE EVERY 30 DAYS AS DIRECTED   fish oil-omega-3 fatty acids 1000 MG capsule Take 2 g by mouth daily.   glipiZIDE (GLUCOTROL) 5 MG tablet TAKE ONE-HALF (1/2) TABLET BY MOUTH TWICE  DAILY WITH MEALS   metFORMIN (GLUCOPHAGE) 1000 MG tablet TAKE ONE TABLET TWICE A DAY WITH MEALS   Multiple Vitamin (MULTIVITAMIN) tablet Take 1 tablet by mouth daily.   NEEDLE, DISP, 25 G 82X X 1" MISC 1 application by Does not apply route every 30 (thirty) days.   rosuvastatin (CRESTOR) 40 MG tablet TAKE 1 TABLET BY MOUTH EVERY EVENING.   telmisartan (MICARDIS) 40 MG tablet TAKE 1 TABLET BY MOUTH AT BEDTIME    Allergies: Neosporin [neomycin-bacitracin zn-polymyx] and Polysporin [bacitracin-polymyxin b]  Social History   Tobacco Use   Smoking status: Former    Packs/day: 2.00    Years: 30.00    Pack years: 60.00    Types: Cigarettes    Quit date: 1982    Years since quitting: 40.9   Smokeless tobacco: Former    Types: Chew    Quit date: 10/19/1990  Vaping Use   Vaping Use: Never used  Substance Use Topics   Alcohol use: Not Currently   Drug use: No    Family History  Problem Relation Age of Onset   Heart attack Father 66   Dementia Sister    Kidney disease Sister    Cancer Neg Hx     Review of Systems: A 12-system review of systems was performed and was negative except as noted in the HPI.  --------------------------------------------------------------------------------------------------  Physical Exam: BP (!) 150/70 (BP Location: Right Arm, Patient Position: Sitting, Cuff Size: Large)   Pulse 82   Ht 5\' 10"  (1.778 m)   Wt 197 lb (89.4 kg)   SpO2 97%   BMI 28.27 kg/m   General: AD. HEENT: No conjunctival pallor or scleral icterus. Facemask in place. Neck: Supple without lymphadenopathy, thyromegaly, JVD, or HJR. No carotid bruit. Lungs: Normal work of breathing. Clear to auscultation bilaterally without wheezes or crackles. Heart: Regular rate and rhythm without murmurs, rubs, or gallops. Non-displaced PMI. Abd: Bowel sounds present. Soft, NT/ND without hepatosplenomegaly Ext: No lower extremity edema. Radial, PT, and DP pulses are 2+ bilaterally Skin: Warm  and dry without rash. Neuro: CNIII-XII intact. Strength and fine-touch sensation intact in upper and lower extremities bilaterally. Psych: Normal mood and affect.  EKG: Normal sinus rhythm with left anterior fascicular block.  PACs noted on prior tracing from 12/08/2020 are no longer present.  Lab Results  Component Value Date   WBC 7.5 12/09/2020   HGB 13.7 12/09/2020   HCT 41.2 12/09/2020   MCV 85.8 12/09/2020   PLT 278.0 12/09/2020    Lab Results  Component Value Date   NA 135 12/09/2020   K 3.9 12/09/2020   CL 101 12/09/2020   CO2 27 12/09/2020   BUN 15 12/09/2020   CREATININE 1.15 12/09/2020   GLUCOSE 257 (H) 12/09/2020   ALT 16 12/09/2020    Lab Results  Component Value Date   CHOL 120 02/11/2020   HDL 39.40 02/11/2020   LDLCALC 55 02/11/2020   LDLDIRECT 71.0 04/25/2017   TRIG 129.0 02/11/2020   CHOLHDL 3 02/11/2020     --------------------------------------------------------------------------------------------------  ASSESSMENT AND PLAN: Questionable paroxysmal atrial fibrillation and premature atrial contractions: [name] [name] was recently noted to have an irregular rhythm at the time of his cataract surgery with EKG  being interpreted as atrial fibrillation.  I have personally reviewed the tracing from 12/08/2020 as well as today's EKG, neither of which demonstrate atrial fibrillation.  Tracing on 12/08/2020 is consistent with sinus rhythm with PACs.  There are no telemetry/rhythm strips available for comparison, so I cannot entirely exclude paroxysmal atrial fibrillation during that encounter.  We have discussed further evaluation options and have agreed to obtain a 14-day event monitor to evaluate for paroxysmal atrial fibrillation.  As there has been no definite evidence of atrial fibrillation thus far, anticoagulation will be deferred.  If atrial fibrillation is detected, however, [name] [name] would benefit from chronic anticoagulation in the setting of a  CHA2DS2-VASc score of at least 4 (age x 2, hypertension, and diabetes mellitus).  It is reasonable to start recently prescribed carvedilol to help with blood pressure control and PACs.  Hypertension: Blood pressure suboptimally controlled today but typically better.  Defer medication changes today with ongoing management per PCP.  Hyperlipidemia associated with type 2 diabetes mellitus: Lipids well controlled on last check in 01/2020.  Continue current medications and ongoing follow-up through PCP.  Follow-up: Return to clinic in 6 weeks.  Nelva Bush, MD 12/19/2020 9:45 AM

## 2020-12-19 NOTE — Patient Instructions (Addendum)
Medication Instructions:  Your physician recommends that you continue on your current medications as directed. Please refer to the Current Medication list given to you today.  *If you need a refill on your cardiac medications before your next appointment, please call your pharmacy*   Lab Work:  None ordered  Testing/Procedures:  Your physician has recommended that you wear a Zio XT monitor for TWO WEEKS.  This will be mailed to you.   This monitor is a medical device that records the heart's electrical activity. Doctors most often use these monitors to diagnose arrhythmias. Arrhythmias are problems with the speed or rhythm of the heartbeat. The monitor is a small device applied to your chest. You can wear one while you do your normal daily activities. While wearing this monitor if you have any symptoms to push the button and record what you felt. Once you have worn this monitor for the period of time provider prescribed (Usually 14 days), you will return the monitor device in the postage paid box. Once it is returned they will download the data collected and provide [place] with a report which the provider will then review and we will call you with those results. Important tips:  Avoid showering during the first 24 hours of wearing the monitor. Avoid excessive sweating to help maximize wear time. Do not submerge the device, no hot tubs, and no swimming pools. Keep any lotions or oils away from the patch. After 24 hours you may shower with the patch on. Take brief showers with your back facing the shower head.  Do not remove patch once it has been placed because that will interrupt data and decrease adhesive wear time. Push the button when you have any symptoms and write down what you were feeling. Once you have completed wearing your monitor, remove and place into box which has postage paid and place in your outgoing mailbox.  If for some reason you have misplaced your box then call our office  and we can provide another box and/or mail it off for you.   Follow-Up: At [facility], you and your health needs are our priority.  As part of our continuing mission to provide you with exceptional heart care, we have created designated Provider Care Teams.  These Care Teams include your primary Cardiologist (physician) and Advanced Practice Providers (APPs -  Physician Assistants and Nurse Practitioners) who all work together to provide you with the care you need, when you need it.  We recommend signing up for the patient portal called "MyChart".  Sign up information is provided on this After Visit Summary.  MyChart is used to connect with patients for Virtual Visits (Telemedicine).  Patients are able to view lab/test results, encounter notes, upcoming appointments, etc.  Non-urgent messages can be sent to your provider as well.   To learn more about what you can do with MyChart, go to [email or web address].    Your next appointment:   6 week(s)  The format for your next appointment:   In Person  Provider:   You may see Dr. Harrell Gave End or one of the following Advanced Practice Providers on your designated Care Team:   Murray Hodgkins, NP Christell Faith, PA-C Cadence Kathlen Mody, [place]

## 2020-12-23 ENCOUNTER — Ambulatory Visit: Payer: Medicare PPO | Admitting: Adult Health

## 2020-12-24 DIAGNOSIS — I480 Paroxysmal atrial fibrillation: Secondary | ICD-10-CM

## 2021-01-07 ENCOUNTER — Telehealth: Payer: Self-pay | Admitting: Internal Medicine

## 2021-01-07 DIAGNOSIS — E1142 Type 2 diabetes mellitus with diabetic polyneuropathy: Secondary | ICD-10-CM

## 2021-01-07 NOTE — Addendum Note (Signed)
Addended by: Crecencio Mc on: 01/07/2021 04:02 PM   Modules accepted: Orders

## 2021-01-07 NOTE — Telephone Encounter (Signed)
Patient has a lab appt 12/28, there are no orders in.

## 2021-01-14 ENCOUNTER — Other Ambulatory Visit (INDEPENDENT_AMBULATORY_CARE_PROVIDER_SITE_OTHER): Payer: Medicare PPO

## 2021-01-14 ENCOUNTER — Other Ambulatory Visit: Payer: Medicare PPO

## 2021-01-14 ENCOUNTER — Other Ambulatory Visit: Payer: Self-pay

## 2021-01-14 DIAGNOSIS — E1142 Type 2 diabetes mellitus with diabetic polyneuropathy: Secondary | ICD-10-CM

## 2021-01-15 LAB — COMPREHENSIVE METABOLIC PANEL
ALT: 14 U/L (ref 0–53)
AST: 15 U/L (ref 0–37)
Albumin: 4 g/dL (ref 3.5–5.2)
Alkaline Phosphatase: 63 U/L (ref 39–117)
BUN: 18 mg/dL (ref 6–23)
CO2: 27 mEq/L (ref 19–32)
Calcium: 9.5 mg/dL (ref 8.4–10.5)
Chloride: 101 mEq/L (ref 96–112)
Creatinine, Ser: 1.03 mg/dL (ref 0.40–1.50)
GFR: 69.47 mL/min (ref >60.00)
Glucose, Bld: 187 mg/dL — ABNORMAL HIGH (ref 70–99)
Potassium: 4 mEq/L (ref 3.5–5.1)
Sodium: 136 mEq/L (ref 135–145)
Total Bilirubin: 0.4 mg/dL (ref 0.2–1.2)
Total Protein: 6.4 g/dL (ref 6.0–8.3)

## 2021-01-15 LAB — LIPID PANEL
Cholesterol: 130 mg/dL (ref 0–200)
HDL: 34.1 mg/dL — ABNORMAL LOW (ref >39.00)
NonHDL: 96.04
Total CHOL/HDL Ratio: 4
Triglycerides: 259 mg/dL — ABNORMAL HIGH (ref 0.0–149.0)
VLDL: 51.8 mg/dL — ABNORMAL HIGH (ref 0.0–40.0)

## 2021-01-15 LAB — LDL CHOLESTEROL, DIRECT: Direct LDL: 75 mg/dL

## 2021-01-15 LAB — MICROALBUMIN / CREATININE URINE RATIO
Creatinine,U: 123.7 mg/dL
Microalb Creat Ratio: 1.9 mg/g (ref 0.0–30.0)
Microalb, Ur: 2.3 mg/dL — ABNORMAL HIGH (ref 0.0–1.9)

## 2021-01-15 LAB — HEMOGLOBIN A1C: Hgb A1c MFr Bld: 7.7 % — ABNORMAL HIGH (ref 4.6–6.5)

## 2021-01-16 ENCOUNTER — Encounter: Payer: Self-pay | Admitting: Internal Medicine

## 2021-01-16 ENCOUNTER — Ambulatory Visit (INDEPENDENT_AMBULATORY_CARE_PROVIDER_SITE_OTHER): Payer: Medicare PPO | Admitting: Internal Medicine

## 2021-01-16 DIAGNOSIS — I10 Essential (primary) hypertension: Secondary | ICD-10-CM | POA: Diagnosis not present

## 2021-01-16 DIAGNOSIS — E1142 Type 2 diabetes mellitus with diabetic polyneuropathy: Secondary | ICD-10-CM

## 2021-01-16 MED ORDER — CARVEDILOL 3.125 MG PO TABS: 3.1250 mg | ORAL_TABLET | Freq: Two times a day (BID) | ORAL | 0 refills | Status: DC

## 2021-01-16 MED ORDER — GLIPIZIDE 5 MG PO TABS: 5.0000 mg | ORAL_TABLET | Freq: Two times a day (BID) | ORAL | 1 refills | Status: DC

## 2021-01-16 MED ORDER — BLOOD GLUCOSE MONITOR KIT: PACK | 0 refills | Status: DC

## 2021-01-16 NOTE — Progress Notes (Signed)
Virtual Visit converted to Telephone  Note  This visit type was conducted due to national recommendations for restrictions regarding the COVID-19 pandemic (e.g. social distancing).  This format is felt to be most appropriate for this patient at this time.  All issues noted in this document were discussed and addressed.  No physical exam was performed (except for noted visual exam findings with Video Visits).   I connected withNAME@ on 01/16/21 at  8:30 AM EST by a video enabled telemedicine application and verified that I am speaking with the correct person using two identifiers. Location patient: home Location provider: work or home office Persons participating in the virtual visit: patient, provider  I discussed the limitations, risks, security and privacy concerns of performing an evaluation and management service by telephone and the availability of in person appointments. I also discussed with the patient that there may be a patient responsible charge related to this service. The patient expressed understanding and agreed to proceed.  Interactive audio and video telecommunications were attempted between this provider and patient, however failed, due to patient having technical difficulties   We continued and completed visit with audio only.   Reason for visit: follow up on type 2 DM, hyperlipidemia   HPI:   He feels generally well, is  walking occasionally but not daily  does not check blood sugars  Denies any recent hypoglyemic events.  Taking his medications as directed. Not Following a carbohydrate modified diet 6 days per week. Diet reviewed:  breakfast is eggs/meat/toast or biscuit,  Lunch : a sandwhich at most.  Dinner usually includes a potato .  He Is taking meds as directed :  2.5 mg glipizide twice daily   Hypertension: patient checks blood pressure twice weekly at home.  Readings have been for the most part < 140/80 at rest . Patient is following a reduce salt diet most days and  is taking medications as prescribed but not sure if he is taking carvedilol.   ROS: See pertinent positives and negatives per HPI. Past Medical History:  Diagnosis Date   Diabetes mellitus    Type 2   Hyperlipidemia    Hypertension    Peripheral neuropathy 01/19/2007   positive EMG studies, negative workup for causes   Positive PPD, treated 01/19/1968    Past Surgical History:  Procedure Laterality Date   CATARACT EXTRACTION W/PHACO Right 11/24/2020   Procedure: CATARACT EXTRACTION PHACO AND INTRAOCULAR LENS PLACEMENT ([place]) RIGHT DIABETIC 4.17 00:33.8;  Surgeon: Eulogio Bear, MD;  Location: [place];  Service: Ophthalmology;  Laterality: Right;   CATARACT EXTRACTION W/PHACO Left 12/08/2020   Procedure: CATARACT EXTRACTION PHACO AND INTRAOCULAR LENS PLACEMENT ([place]) LEFT DIABETIC;  Surgeon: Eulogio Bear, MD;  Location: [place];  Service: Ophthalmology;  Laterality: Left;  8.81 00:44.4   COLONOSCOPY  2004, 2014   Dr Bary Castilla   COLONOSCOPY WITH PROPOFOL N/A 04/30/2015   Procedure: COLONOSCOPY WITH PROPOFOL;  Surgeon: Robert Bellow, MD;  Location[facility] ENDOSCOPY;  Service: Endoscopy;  Laterality: N/A;   ROTATOR CUFF REPAIR  2005   right (Dr. Francia Greaves)   TONSILLECTOMY     WRIST FRACTURE SURGERY Left    Armour, Ted     Family History  Problem Relation Age of Onset   Heart attack Father 67   Dementia Sister    Kidney disease Sister    Cancer Neg Hx     SOCIAL HX:  reports that he quit smoking about 41 years ago. His smoking use  included cigarettes. He has a 60.00 pack-year smoking history. He quit smokeless tobacco use about 30 years ago.  His smokeless tobacco use included chew. He reports that he does not currently use alcohol. He reports that he does not use drugs.    Current Outpatient Medications:    aspirin 81 MG tablet, Take 81 mg by mouth daily., Disp: , Rfl:    B-D 3CC LUER-LOK SYR 25GX1" 25G X 1" 3 ML MISC, USE AS DIRECTED FOR B12  INJECTIONS, Disp: 6 each, Rfl: 0   blood glucose meter kit and supplies KIT, Use once daily to check blood sugars (90 day supply), Disp: 1 each, Rfl: 0   Cholecalciferol (D3-1000) 25 MCG (1000 UT) capsule, Take 1,000 Units by mouth daily., Disp: , Rfl:    Choline Fenofibrate (FENOFIBRIC ACID) 135 MG CPDR, TAKE 1 CAPSULE BY MOUTH EVERY DAY., Disp: 90 capsule, Rfl: 1   co-enzyme Q-10 30 MG capsule, Take 30 mg by mouth daily., Disp: , Rfl:    cyanocobalamin (,VITAMIN B-12,) 1000 MCG/ML injection, INJECT 1 ML INTO THE MUSCLE EVERY 30 DAYS AS DIRECTED, Disp: 10 mL, Rfl: 1   fish oil-omega-3 fatty acids 1000 MG capsule, Take 2 g by mouth daily., Disp: , Rfl:    metFORMIN (GLUCOPHAGE) 1000 MG tablet, TAKE ONE TABLET TWICE A DAY WITH MEALS, Disp: 60 tablet, Rfl: 2   Multiple Vitamin (MULTIVITAMIN) tablet, Take 1 tablet by mouth daily., Disp: , Rfl:    NEEDLE, DISP, 25 G 25G X 1" MISC, 1 application by Does not apply route every 30 (thirty) days., Disp: 6 each, Rfl: 0   rosuvastatin (CRESTOR) 40 MG tablet, TAKE 1 TABLET BY MOUTH EVERY EVENING., Disp: 90 tablet, Rfl: 1   telmisartan (MICARDIS) 40 MG tablet, TAKE 1 TABLET BY MOUTH AT BEDTIME, Disp: 90 tablet, Rfl: 1   carvedilol (COREG) 3.125 MG tablet, Take 1 tablet (3.125 mg total) by mouth 2 (two) times daily with a meal., Disp: 180 tablet, Rfl: 0   glipiZIDE (GLUCOTROL) 5 MG tablet, Take 1 tablet (5 mg total) by mouth 2 (two) times daily before a meal., Disp: 180 tablet, Rfl: 1  EXAM:  VITALS per patient if applicable:  GENERAL: alert, oriented, appears well and in no acute distress  HEENT: atraumatic, conjunttiva clear, no obvious abnormalities on inspection of external nose and ears  NECK: normal movements of the head and neck  LUNGS: on inspection no signs of respiratory distress, breathing rate appears normal, no obvious gross SOB, gasping or wheezing  CV: no obvious cyanosis  MS: moves all visible extremities without noticeable  abnormality  PSYCH/NEURO: pleasant and cooperative, no obvious depression or anxiety, speech and thought processing grossly intact  ASSESSMENT AND PLAN:  Discussed the following assessment and plan:  Type 2 DM with diabetic neuropathy affecting both sides of body (HCC)  Essential hypertension  Type 2 DM with diabetic neuropathy affecting both sides of body Currently uncontrolled.  Increase glipizide to 5 mg bid. =advised to start checking blood sugars once daily   return in one month.  Continue asa,  ARB, statin   Lab Results  Component Value Date   HGBA1C 7.7 (H) 01/14/2021   Lab Results  Component Value Date   MICROALBUR 2.3 (H) 01/14/2021   MICROALBUR 0.8 07/24/2019       Essential hypertension Carvedilol ladded last month but he is not certain he is taking it.     I discussed the assessment and treatment plan with the patient. The patient  was provided an opportunity to ask questions and all were answered. The patient agreed with the plan and demonstrated an understanding of the instructions.   The patient was advised to call back or seek an in-person evaluation if the symptoms worsen or if the condition fails to improve as anticipated.   I spent 30 minutes dedicated to the care of this patient on the date of this encounter to include pre-visit review of his medical history,  non Face-to-face time with the patient , and post visit ordering of testing and therapeutics.    Crecencio Mc, MD

## 2021-01-16 NOTE — Patient Instructions (Addendum)
You should be taking carvedilol two times daily for your blood pressure .  This was added by michelle in November and also advised by Dr End.   Your diabetes is no longer under control.  Please increase your glipizide to a FULL TABLET  before breakfast and before dinner   Limit your servings of starch to 2-3 servings daily (cut out potatoes daily, limit the bread to whole wheat)   Check blood sugars once daily and WRITE THEM DOWN IN THE SPACES PROVIDED ON THE SPREADSHEET :  Week 1:  fasting (before eating , in the early morning)  Week 2:  2 hours after breakfast  Week 3:  2 hours after lunch (even if you don't eat)  Week 4:  2 hours after dinner meal   RETURN in one month [place]

## 2021-01-17 NOTE — Assessment & Plan Note (Signed)
Carvedilol ladded last month but he is not certain he is taking it.

## 2021-01-17 NOTE — Assessment & Plan Note (Signed)
Currently uncontrolled.  Increase glipizide to 5 mg bid. =advised to start checking blood sugars once daily   return in one month.  Continue asa,  ARB, statin   Lab Results  Component Value Date   HGBA1C 7.7 (H) 01/14/2021   Lab Results  Component Value Date   MICROALBUR 2.3 (H) 01/14/2021   MICROALBUR 0.8 07/24/2019

## 2021-01-20 ENCOUNTER — Telehealth: Payer: Self-pay | Admitting: *Deleted

## 2021-01-20 DIAGNOSIS — I491 Atrial premature depolarization: Secondary | ICD-10-CM

## 2021-01-20 NOTE — Telephone Encounter (Signed)
Called and spoke with pt. Notified of event monitor results and Dr. Darnelle Bos recc.  Pt voiced understanding.  Echo ordered. Pt aware our office will contact him to schedule.  Pt has follow up currently scheduled 02/06/21.  Forwarding to scheduling to contact pt.  Pt has no further questions at this time.

## 2021-01-20 NOTE — Telephone Encounter (Signed)
-----   Message from Nelva Bush, MD sent at 01/19/2021  8:02 AM EST ----- Please let [name] [name] know that his heart monitor showed frequent extra beats, as noted on his prior EKG's, but no signs of atrial fibrillation.  I recommend that we obtain an echocardiogram to evaluate for structural abnormalities that could be contributing to his frequent supraventricular ectopy.  He should follow-up as scheduled later this month in the office as well.

## 2021-01-22 ENCOUNTER — Ambulatory Visit (INDEPENDENT_AMBULATORY_CARE_PROVIDER_SITE_OTHER): Payer: Medicare PPO

## 2021-01-22 ENCOUNTER — Other Ambulatory Visit: Payer: Self-pay

## 2021-01-22 DIAGNOSIS — I491 Atrial premature depolarization: Secondary | ICD-10-CM

## 2021-01-23 LAB — ECHOCARDIOGRAM COMPLETE
AR max vel: 3.07 cm2
AV Area VTI: 3.28 cm2
AV Area mean vel: 3.42 cm2
AV Mean grad: 5 mmHg
AV Peak grad: 9.2 mmHg
Ao pk vel: 1.52 m/s
Area-P 1/2: 2.29 cm2
Calc EF: 48.7 %
S' Lateral: 4.3 cm
Single Plane A2C EF: 43.7 %
Single Plane A4C EF: 53.3 %

## 2021-02-05 ENCOUNTER — Encounter: Payer: Self-pay | Admitting: Nurse Practitioner

## 2021-02-05 DIAGNOSIS — Z85828 Personal history of other malignant neoplasm of skin: Secondary | ICD-10-CM | POA: Diagnosis not present

## 2021-02-05 DIAGNOSIS — D2262 Melanocytic nevi of left upper limb, including shoulder: Secondary | ICD-10-CM | POA: Diagnosis not present

## 2021-02-05 DIAGNOSIS — D225 Melanocytic nevi of trunk: Secondary | ICD-10-CM | POA: Diagnosis not present

## 2021-02-05 DIAGNOSIS — D485 Neoplasm of uncertain behavior of skin: Secondary | ICD-10-CM | POA: Diagnosis not present

## 2021-02-05 DIAGNOSIS — L570 Actinic keratosis: Secondary | ICD-10-CM | POA: Diagnosis not present

## 2021-02-05 DIAGNOSIS — Z85820 Personal history of malignant melanoma of skin: Secondary | ICD-10-CM | POA: Diagnosis not present

## 2021-02-05 DIAGNOSIS — D0421 Carcinoma in situ of skin of right ear and external auricular canal: Secondary | ICD-10-CM | POA: Diagnosis not present

## 2021-02-05 DIAGNOSIS — D2271 Melanocytic nevi of right lower limb, including hip: Secondary | ICD-10-CM | POA: Diagnosis not present

## 2021-02-05 DIAGNOSIS — D034 Melanoma in situ of scalp and neck: Secondary | ICD-10-CM | POA: Diagnosis not present

## 2021-02-06 ENCOUNTER — Ambulatory Visit: Payer: Medicare PPO | Admitting: Nurse Practitioner

## 2021-02-06 ENCOUNTER — Other Ambulatory Visit: Payer: Self-pay

## 2021-02-06 ENCOUNTER — Encounter: Payer: Self-pay | Admitting: Nurse Practitioner

## 2021-02-06 VITALS — BP 122/66 | HR 66 | Ht 69.0 in | Wt 196.0 lb

## 2021-02-06 DIAGNOSIS — I491 Atrial premature depolarization: Secondary | ICD-10-CM | POA: Diagnosis not present

## 2021-02-06 DIAGNOSIS — I10 Essential (primary) hypertension: Secondary | ICD-10-CM | POA: Diagnosis not present

## 2021-02-06 DIAGNOSIS — I471 Supraventricular tachycardia: Secondary | ICD-10-CM

## 2021-02-06 DIAGNOSIS — E782 Mixed hyperlipidemia: Secondary | ICD-10-CM

## 2021-02-06 DIAGNOSIS — R931 Abnormal findings on diagnostic imaging of heart and coronary circulation: Secondary | ICD-10-CM | POA: Diagnosis not present

## 2021-02-06 NOTE — Patient Instructions (Signed)
Medication Instructions:  No changes at this time.  *If you need a refill on your cardiac medications before your next appointment, please call your pharmacy*   Lab Work: None  If you have labs (blood work) drawn today and your tests are completely normal, you will receive your results only by: [place] (if you have MyChart) OR A paper copy in the mail If you have any lab test that is abnormal or we need to change your treatment, we will call you to review the results.   Testing/Procedures: None   Follow-Up: At [facility], you and your health needs are our priority.  As part of our continuing mission to provide you with exceptional heart care, we have created designated Provider Care Teams.  These Care Teams include your primary Cardiologist (physician) and Advanced Practice Providers (APPs -  Physician Assistants and Nurse Practitioners) who all work together to provide you with the care you need, when you need it.   Your next appointment:   4 month(s)  The format for your next appointment:   In Person  Provider:   Nelva Bush, MD or Murray Hodgkins, NP

## 2021-02-06 NOTE — Progress Notes (Signed)
Office Visit    Patient Name: [name] [name] Date of Encounter: 02/06/2021  Primary Care Provider:  Crecencio Mc, MD Primary Cardiologist:  None  Chief Complaint    81 y/o ? w/ a h/o hypertension, hyperlipidemia, diabetes, abnormal EKG, and PACs, presents for follow-up related to recent monitoring and echocardiogram.  Past Medical History    Past Medical History:  Diagnosis Date   Abnormal echocardiogram    a. 01/2021 Echo: EF 50%, septal DK (bbb/IVCD), infero/posterior HK. GrI DD. Nl RV fxn. Ao root 55mm. Asc Ao 36mm.   Cataracts, bilateral    Diabetes mellitus    Type 2   Hyperlipidemia    Hypertension    Peripheral neuropathy 01/19/2007   positive EMG studies, negative workup for causes   Positive PPD, treated 01/19/1968   Premature atrial contractions    a. 12/2020 Zio: predominantly RSR, 67 (47-107). Freq PACs (7-8% burden). Rare PVCs. 149 atrial runs (longest 11.5 secs, max rate 193). No sustained arrhythmias/pauses; no afib/flutter.   Past Surgical History:  Procedure Laterality Date   CATARACT EXTRACTION W/PHACO Right 11/24/2020   Procedure: CATARACT EXTRACTION PHACO AND INTRAOCULAR LENS PLACEMENT ([facility]) RIGHT DIABETIC 4.17 00:33.8;  Surgeon: Eulogio Bear, MD;  Location: [place];  Service: Ophthalmology;  Laterality: Right;   CATARACT EXTRACTION W/PHACO Left 12/08/2020   Procedure: CATARACT EXTRACTION PHACO AND INTRAOCULAR LENS PLACEMENT ([place]) LEFT DIABETIC;  Surgeon: Eulogio Bear, MD;  Location: [place];  Service: Ophthalmology;  Laterality: Left;  8.81 00:44.4   COLONOSCOPY  2004, 2014   Dr Bary Castilla   COLONOSCOPY WITH PROPOFOL N/A 04/30/2015   Procedure: COLONOSCOPY WITH PROPOFOL;  Surgeon: Robert Bellow, MD;  Location: [facility] ENDOSCOPY;  Service: Endoscopy;  Laterality: N/A;   ROTATOR CUFF REPAIR  2005   right (Dr. Francia Greaves)   TONSILLECTOMY     WRIST FRACTURE SURGERY Left    Armour, Ted     Allergies  Allergies   Allergen Reactions   Neosporin [Neomycin-Bacitracin Zn-Polymyx] Rash   Polysporin [Bacitracin-Polymyxin B] Rash    History of Present Illness    81 year old male with above past medical history including hypertension, hyperlipidemia, type 2 diabetes mellitus, abnormal EKG, and PACs.  He recently underwent cataract surgery in November 2022 and was noted to have an irregular heartbeat.  EKG was interpreted as atrial fibrillation and he was seen by his primary care provider and placed on carvedilol therapy, and referred to cardiology.  He was seen by Dr. Saunders Revel on December 2, and review of ECG available ECGs showed sinus rhythm with PACs.  He subsequently underwent event monitoring which showed predominantly sinus rhythm with an average rate of 67 bpm (range of 47-107), frequent PACs (7 to 8% burden) since, rare PVCs, and 149 atrial runs (longest 11.5 seconds with a maximum rate of 193 bpm).  He had no sustained arrhythmias or pauses and no evidence of atrial fibrillation/flutter.  An echocardiogram was performed and showed an EF of 50% with septal dyskinesis (possibly secondary to conduction delay), inferoposterior hypokinesis.  Grade 1 diastolic dysfunction and normal RV function was noted.  The aortic root was minimally dilated at 37 mm with an ascending aorta of 36 mm.  Since his last visit, Mr. Callegari has felt well.  He remains reasonably active without symptoms or limitations, noting "I can do what ever I want."  He denies chest pain, dyspnea, palpitations, PND, orthopnea, dizziness, syncope, edema, or early satiety.  He is looking forward to his  annual fishing trip to [facility], in June.  Home Medications    Current Outpatient Medications  Medication Sig Dispense Refill   aspirin 81 MG tablet Take 81 mg by mouth daily.     B-D 3CC LUER-LOK SYR 25GX1" 25G X 1" 3 ML MISC USE AS DIRECTED FOR B12 INJECTIONS 6 each 0   blood glucose meter kit and supplies KIT Use once daily to check blood sugars  (90 day supply) 1 each 0   carvedilol (COREG) 3.125 MG tablet Take 1 tablet (3.125 mg total) by mouth 2 (two) times daily with a meal. 180 tablet 0   Cholecalciferol (D3-1000) 25 MCG (1000 UT) capsule Take 1,000 Units by mouth daily.     Choline Fenofibrate (FENOFIBRIC ACID) 135 MG CPDR TAKE 1 CAPSULE BY MOUTH EVERY DAY. 90 capsule 1   co-enzyme Q-10 30 MG capsule Take 30 mg by mouth daily.     cyanocobalamin (,VITAMIN B-12,) 1000 MCG/ML injection INJECT 1 ML INTO THE MUSCLE EVERY 30 DAYS AS DIRECTED 10 mL 1   fish oil-omega-3 fatty acids 1000 MG capsule Take 2 g by mouth daily.     glipiZIDE (GLUCOTROL) 5 MG tablet Take 1 tablet (5 mg total) by mouth 2 (two) times daily before a meal. 180 tablet 1   metFORMIN (GLUCOPHAGE) 1000 MG tablet TAKE ONE TABLET TWICE A DAY WITH MEALS 60 tablet 2   Multiple Vitamin (MULTIVITAMIN) tablet Take 1 tablet by mouth daily.     NEEDLE, DISP, 25 G 73S X 1" MISC 1 application by Does not apply route every 30 (thirty) days. 6 each 0   rosuvastatin (CRESTOR) 40 MG tablet TAKE 1 TABLET BY MOUTH EVERY EVENING. 90 tablet 1   telmisartan (MICARDIS) 40 MG tablet TAKE 1 TABLET BY MOUTH AT BEDTIME 90 tablet 1   No current facility-administered medications for this visit.     Review of Systems    He denies chest pain, palpitations, dyspnea, pnd, orthopnea, n, v, dizziness, syncope, edema, weight gain, or early satiety.  All other systems reviewed and are otherwise negative except as noted above.    Physical Exam    VS:  BP 122/66 (BP Location: Left Arm, Patient Position: Sitting, Cuff Size: Normal)    Pulse 66    Ht $R'5\' 9"'Rj$  (1.753 m)    Wt 196 lb (88.9 kg)    SpO2 97%    BMI 28.94 kg/m  , BMI Body mass index is 28.94 kg/m.     GEN: Well nourished, well developed, in no acute distress. HEENT: normal. Neck: Supple, no JVD, carotid bruits, or masses. Cardiac: RRR, no murmurs, rubs, or gallops. No clubbing, cyanosis, edema.  Radials/PT 2+ and equal bilaterally.   Respiratory:  Respirations regular and unlabored, clear to auscultation bilaterally. GI: Soft, nontender, nondistended, BS + x 4. MS: no deformity or atrophy. Skin: warm and dry, no rash. Neuro:  Strength and sensation are intact. Psych: Normal affect.  Accessory Clinical Findings     Lab Results  Component Value Date   WBC 7.5 12/09/2020   HGB 13.7 12/09/2020   HCT 41.2 12/09/2020   MCV 85.8 12/09/2020   PLT 278.0 12/09/2020   Lab Results  Component Value Date   CREATININE 1.03 01/14/2021   BUN 18 01/14/2021   NA 136 01/14/2021   K 4.0 01/14/2021   CL 101 01/14/2021   CO2 27 01/14/2021   Lab Results  Component Value Date   ALT 14 01/14/2021   AST 15 01/14/2021  ALKPHOS 63 01/14/2021   BILITOT 0.4 01/14/2021   Lab Results  Component Value Date   CHOL 130 01/14/2021   HDL 34.10 (L) 01/14/2021   LDLCALC 55 02/11/2020   LDLDIRECT 75.0 01/14/2021   TRIG 259.0 (H) 01/14/2021   CHOLHDL 4 01/14/2021    Lab Results  Component Value Date   HGBA1C 7.7 (H) 01/14/2021    Assessment & Plan    1.  Premature atrial contractions: In November, patient had cataract surgery with concern for irregular heart rhythm and atrial fibrillation however, subsequent evaluation of ECGs indicated sinus rhythm with PACs.  Rhythm strips at the time of his surgery were not available.  He has since worn a Zio monitor for 2 weeks which did not show any evidence of A. fib.  He did have brief runs of SVT as well as frequent PACs with a 7 to 8% overall burden.  He is completely asymptomatic and had no triggered events.  Heart rhythm is regular today.  Continue beta-blocker therapy.  In the absence of atrial fibrillation on monitoring, there is no indication for oral anticoagulation at this time.  2.  PSVT: 149 atrial runs lasting up to 11.5 seconds with a maximum rate of 193 bpm were noted on his recent event monitor.  No triggered events and he denies any symptoms.  Continue beta-blocker  therapy.  3.  Abnormal echocardiogram: Recent echo with an EF of 50% and septal dyskinesis in the setting of intraventricular conduction delay noted on ECG.  Also question of inferior/posterior wall hypokinesis.  We discussed his echo results in the context of his ECG.  He notes very good exercise tolerance without chest pain or dyspnea.  With his history of hypertension, hyperlipidemia, and diabetes, we discussed risk stratification with either stress testing or coronary CT angiography and at this time, he wishes to defer, and instead will contact us if he develops symptoms at some point in the future.  4.  Essential hypertension: Stable on carvedilol and telmisartan therapy.  5.  Hyperlipidemia: On high potency Crestor with an LDL of 75 in late December.  LFTs were normal at that time.  6.  Disposition: Follow-up in clinic in 4 months or sooner if necessary.   Murray Hodgkins, NP 02/06/2021, 8:10 AM

## 2021-02-16 ENCOUNTER — Other Ambulatory Visit: Payer: Self-pay | Admitting: Internal Medicine

## 2021-02-17 ENCOUNTER — Other Ambulatory Visit: Payer: Self-pay

## 2021-02-17 ENCOUNTER — Ambulatory Visit: Payer: Medicare PPO | Admitting: Internal Medicine

## 2021-02-17 ENCOUNTER — Encounter: Payer: Self-pay | Admitting: Internal Medicine

## 2021-02-17 VITALS — BP 122/60 | HR 80 | Temp 97.7°F | Ht 69.0 in | Wt 196.8 lb

## 2021-02-17 DIAGNOSIS — E1142 Type 2 diabetes mellitus with diabetic polyneuropathy: Secondary | ICD-10-CM | POA: Diagnosis not present

## 2021-02-17 NOTE — Assessment & Plan Note (Addendum)
Recent evaluation of blood sugars notes Elevated post prandials to 200 after breakfast and dinner, and fastings > 150..  May be taking the wrong dose of glipizide. I have asked him to confirm his current glipizide dose and if taking 5 mg bid,   will increase his  evening dose to 10 mg .  Continue 5 mg in the am as he often skips lunch

## 2021-02-17 NOTE — Progress Notes (Signed)
Subjective:  Patient ID: [name] [name], male    DOB: 03-02-1940  Age: 81 y.o. MRN: [id number]  CC: The encounter diagnosis was Type 2 DM with diabetic neuropathy affecting both sides of body ([place]).   This visit occurred during the SARS-CoV-2 public health emergency.  Safety protocols were in place, including screening questions prior to the visit, additional usage of staff PPE, and extensive cleaning of exam room while observing appropriate contact time as indicated for disinfecting solutions.    HPI [name] [name] presents for  Chief Complaint  Patient presents with   Follow-up    1 month follow up     T2DM:  He   feels generally well.  He  is not  exercising regularly but has lost ten lbs since last visit   Checking  blood sugars once daily at variable times,   as requested.  BS have been between  150 to 190 fasting and > 150 <200  post prandially.  Denies any recent hypoglyemic events.  He is not clear what dose of glipizide he is taking .   Following a carbohydrate modified diet about 75% of the time . Denies numbness, burning and tingling of extremities. Appetite is good.    Sleeping better.   Outpatient Medications Prior to Visit  Medication Sig Dispense Refill   aspirin 81 MG tablet Take 81 mg by mouth daily.     B-D 3CC LUER-LOK SYR 25GX1" 25G X 1" 3 ML MISC USE AS DIRECTED FOR B12 INJECTIONS 6 each 0   blood glucose meter kit and supplies KIT Use once daily to check blood sugars (90 day supply) 1 each 0   carvedilol (COREG) 3.125 MG tablet Take 1 tablet (3.125 mg total) by mouth 2 (two) times daily with a meal. 180 tablet 0   Cholecalciferol (D3-1000) 25 MCG (1000 UT) capsule Take 1,000 Units by mouth daily.     Choline Fenofibrate (FENOFIBRIC ACID) 135 MG CPDR TAKE 1 CAPSULE BY MOUTH EVERY DAY. 90 capsule 1   co-enzyme Q-10 30 MG capsule Take 30 mg by mouth daily.     cyanocobalamin (,VITAMIN B-12,) 1000 MCG/ML injection INJECT 1 ML INTO THE MUSCLE EVERY 30 DAYS AS DIRECTED 10  mL 1   fish oil-omega-3 fatty acids 1000 MG capsule Take 2 g by mouth daily.     glipiZIDE (GLUCOTROL) 5 MG tablet Take 1 tablet (5 mg total) by mouth 2 (two) times daily before a meal. 180 tablet 1   metFORMIN (GLUCOPHAGE) 1000 MG tablet TAKE ONE TABLET TWICE A DAY WITH MEALS 60 tablet 2   Multiple Vitamin (MULTIVITAMIN) tablet Take 1 tablet by mouth daily.     NEEDLE, DISP, 25 G 81K X 1" MISC 1 application by Does not apply route every 30 (thirty) days. 6 each 0   rosuvastatin (CRESTOR) 40 MG tablet TAKE ONE TABLET EACH EVENING 90 tablet 1   telmisartan (MICARDIS) 40 MG tablet TAKE 1 TABLET BY MOUTH AT BEDTIME 90 tablet 1   No facility-administered medications prior to visit.    Review of Systems;  Patient denies headache, fevers, malaise, unintentional weight loss, skin rash, eye pain, sinus congestion and sinus pain, sore throat, dysphagia,  hemoptysis , cough, dyspnea, wheezing, chest pain, palpitations, orthopnea, edema, abdominal pain, nausea, melena, diarrhea, constipation, flank pain, dysuria, hematuria, urinary  Frequency, nocturia, numbness, tingling, seizures,  Focal weakness, Loss of consciousness,  Tremor, insomnia, depression, anxiety, and suicidal ideation.      Objective:  BP  122/60 (BP Location: Left Arm, Patient Position: Sitting, Cuff Size: Large)    Pulse 80    Temp 97.7 F (36.5 C) (Oral)    Ht _0  (1.753 m)    Wt 196 lb 12.8 oz (89.3 kg)    SpO2 98%    BMI 29.06 kg/m   BP Readings from Last 3 Encounters:  02/17/21 122/60  02/06/21 122/66  12/19/20 (!) 150/70    Wt Readings from Last 3 Encounters:  02/17/21 196 lb 12.8 oz (89.3 kg)  02/06/21 196 lb (88.9 kg)  01/16/21 205 lb (93 kg)    General appearance: alert, cooperative and appears stated age Ears: normal TM's and external ear canals both ears Throat: lips, mucosa, and tongue normal; teeth and gums normal Neck: no adenopathy, no carotid bruit, supple, symmetrical, trachea midline and thyroid not  enlarged, symmetric, no tenderness/mass/nodules Back: symmetric, no curvature. ROM normal. No CVA tenderness. Lungs: clear to auscultation bilaterally Heart: regular rate and rhythm, S1, S2 normal, no murmur, click, rub or gallop Abdomen: soft, non-tender; bowel sounds normal; no masses,  no organomegaly Pulses: 2+ and symmetric Skin: Skin color, texture, turgor normal. No rashes or lesions Lymph nodes: Cervical, supraclavicular, and axillary nodes normal.  Lab Results  Component Value Date   HGBA1C 7.7 (H) 01/14/2021   HGBA1C 7.1 (H) 02/11/2020   HGBA1C 6.8 (A) 07/24/2019    Lab Results  Component Value Date   CREATININE 1.03 01/14/2021   CREATININE 1.15 12/09/2020   CREATININE 1.03 02/11/2020    Lab Results  Component Value Date   WBC 7.5 12/09/2020   HGB 13.7 12/09/2020   HCT 41.2 12/09/2020   PLT 278.0 12/09/2020   GLUCOSE 187 (H) 01/14/2021   CHOL 130 01/14/2021   TRIG 259.0 (H) 01/14/2021   HDL 34.10 (L) 01/14/2021   LDLDIRECT 75.0 01/14/2021   LDLCALC 55 02/11/2020   ALT 14 01/14/2021   AST 15 01/14/2021   NA 136 01/14/2021   K 4.0 01/14/2021   CL 101 01/14/2021   CREATININE 1.03 01/14/2021   BUN 18 01/14/2021   CO2 27 01/14/2021   TSH 0.56 12/09/2020   PSA 1.61 08/25/2017   HGBA1C 7.7 (H) 01/14/2021   MICROALBUR 2.3 (H) 01/14/2021    DG Chest 2 View  Result Date: 12/10/2020 CLINICAL DATA:  New atrial fibrillation.  Rule out CHF. EXAM: CHEST - 2 VIEW COMPARISON:  None. FINDINGS: Mild diffuse chronic interstitial coarsening. No focal consolidation, pleural effusion or pneumothorax. The cardiac silhouette is within normal limits. Degenerative changes of the spine. No acute osseous pathology. IMPRESSION: No active cardiopulmonary disease. Electronically Signed   By: Anner Crete M.D.   On: 12/10/2020 01:23    Assessment & Plan:   Problem List Items Addressed This Visit     Type 2 DM with diabetic neuropathy affecting both sides of body ([place]) -  Primary    Recent evaluation of blood sugars notes Elevated post prandials to 200 after breakfast and dinner, and fastings > 150..  May be taking the wrong dose of glipizide. I have asked him to confirm his current glipizide dose and if taking 5 mg bid,   will increase his  evening dose to 10 mg .  Continue 5 mg in the am as he often skips lunch      Relevant Orders   Hemoglobin A1c   Comprehensive metabolic panel   Lipid panel    I spent 20 minutes dedicated to the care of this patient on the  date of this encounter to include pre-visit review of patient's medical history,  most recent imaging studies, Face-to-face time with the patient , and post visit ordering of testing and therapeutics.    Follow-up: No follow-ups on file.   Crecencio Mc, MD

## 2021-02-17 NOTE — Patient Instructions (Addendum)
Thanks for checking and bring your sugars with you today!  Your post breakfast  and post dinner  sugars are too high.  (160 or less is your goal )   Check on your dose of glipizide currently : you should be taking 1 tablet (5 mg )  twice daily before breakfast and dinner   If you HAVE been taking this dose,  I want you to increase the evening dose only  to 2  tablets ( 10 mg total) and continue 5 mg in the morning)   We'll repeat your labs after March 28 and I'll see you in April

## 2021-03-06 ENCOUNTER — Other Ambulatory Visit: Payer: Self-pay | Admitting: Internal Medicine

## 2021-03-25 DIAGNOSIS — D0422 Carcinoma in situ of skin of left ear and external auricular canal: Secondary | ICD-10-CM | POA: Diagnosis not present

## 2021-03-25 DIAGNOSIS — D034 Melanoma in situ of scalp and neck: Secondary | ICD-10-CM | POA: Diagnosis not present

## 2021-04-08 DIAGNOSIS — T498X5A Adverse effect of other topical agents, initial encounter: Secondary | ICD-10-CM | POA: Diagnosis not present

## 2021-04-08 DIAGNOSIS — D0422 Carcinoma in situ of skin of left ear and external auricular canal: Secondary | ICD-10-CM | POA: Diagnosis not present

## 2021-04-17 ENCOUNTER — Other Ambulatory Visit (INDEPENDENT_AMBULATORY_CARE_PROVIDER_SITE_OTHER): Payer: Medicare PPO

## 2021-04-17 DIAGNOSIS — E1142 Type 2 diabetes mellitus with diabetic polyneuropathy: Secondary | ICD-10-CM

## 2021-04-17 LAB — COMPREHENSIVE METABOLIC PANEL
ALT: 13 U/L (ref 0–53)
AST: 16 U/L (ref 0–37)
Albumin: 4 g/dL (ref 3.5–5.2)
Alkaline Phosphatase: 53 U/L (ref 39–117)
BUN: 18 mg/dL (ref 6–23)
CO2: 29 mEq/L (ref 19–32)
Calcium: 9.9 mg/dL (ref 8.4–10.5)
Chloride: 103 mEq/L (ref 96–112)
Creatinine, Ser: 1.02 mg/dL (ref 0.40–1.50)
GFR: 70.17 mL/min (ref >60.00)
Glucose, Bld: 135 mg/dL — ABNORMAL HIGH (ref 70–99)
Potassium: 4.3 mEq/L (ref 3.5–5.1)
Sodium: 138 mEq/L (ref 135–145)
Total Bilirubin: 0.4 mg/dL (ref 0.2–1.2)
Total Protein: 6.3 g/dL (ref 6.0–8.3)

## 2021-04-17 LAB — LIPID PANEL
Cholesterol: 135 mg/dL (ref 0–200)
HDL: 39.8 mg/dL (ref >39.00)
LDL Cholesterol: 71 mg/dL (ref 0–99)
NonHDL: 95.41
Total CHOL/HDL Ratio: 3
Triglycerides: 120 mg/dL (ref 0.0–149.0)
VLDL: 24 mg/dL (ref 0.0–40.0)

## 2021-04-17 LAB — HEMOGLOBIN A1C: Hgb A1c MFr Bld: 7.3 % — ABNORMAL HIGH (ref 4.6–6.5)

## 2021-04-29 ENCOUNTER — Encounter: Payer: Self-pay | Admitting: Internal Medicine

## 2021-04-29 ENCOUNTER — Ambulatory Visit: Payer: Medicare PPO | Admitting: Internal Medicine

## 2021-04-29 VITALS — BP 122/74 | HR 87 | Temp 97.5°F | Ht 69.0 in | Wt 196.0 lb

## 2021-04-29 DIAGNOSIS — E1169 Type 2 diabetes mellitus with other specified complication: Secondary | ICD-10-CM | POA: Diagnosis not present

## 2021-04-29 DIAGNOSIS — I10 Essential (primary) hypertension: Secondary | ICD-10-CM | POA: Diagnosis not present

## 2021-04-29 DIAGNOSIS — Z1211 Encounter for screening for malignant neoplasm of colon: Secondary | ICD-10-CM | POA: Diagnosis not present

## 2021-04-29 DIAGNOSIS — E785 Hyperlipidemia, unspecified: Secondary | ICD-10-CM | POA: Diagnosis not present

## 2021-04-29 DIAGNOSIS — I471 Supraventricular tachycardia: Secondary | ICD-10-CM | POA: Diagnosis not present

## 2021-04-29 DIAGNOSIS — E1142 Type 2 diabetes mellitus with diabetic polyneuropathy: Secondary | ICD-10-CM | POA: Diagnosis not present

## 2021-04-29 MED ORDER — TETANUS-DIPHTH-ACELL PERTUSSIS 5-2.5-18.5 LF-MCG/0.5 IM SUSP: 0.5000 mL | Freq: Once | INTRAMUSCULAR | 0 refills | Status: AC

## 2021-04-29 MED ORDER — ZOSTER VAC RECOMB ADJUVANTED 50 MCG/0.5ML IM SUSR
0.5000 mL | Freq: Once | INTRAMUSCULAR | 1 refills | Status: AC
Start: 2021-04-29 — End: 2021-04-29

## 2021-04-29 NOTE — Progress Notes (Signed)
? ?Subjective:  ?Patient ID: [name] [name], male    DOB: 1940-12-11  Age: 81 y.o. MRN: [id number] ? ?CC: The primary encounter diagnosis was Essential hypertension. Diagnoses of Type 2 DM with diabetic neuropathy affecting both sides of body ([name] [name]), Hyperlipidemia associated with type 2 diabetes mellitus ([name] [name]), Colon cancer screening, and SVT (supraventricular tachycardia) ([name] [name]) were also pertinent to this visit. ? ? ?This visit occurred during the SARS-CoV-2 public health emergency.  Safety protocols were in place, including screening questions prior to the visit, additional usage of staff PPE, and extensive cleaning of exam room while observing appropriate contact time as indicated for disinfecting solutions.   ? ?HPI ?[name] [name] presents for  ?Chief Complaint  ?Patient presents with  ? Follow-up  ?  3 month follow up on diabetes  ? ?1) T2DM:  with neuropathy.  He feels generally well, is  doing yard work,  not walking or doing regular exercise.   Not checking blood sugars  at variable times.   Denies any recent hypoglyemic events except one symptom on a day he skipped lunch.  Taking his metformin and glipizide as directed. Following a carbohydrate modified diet 6 days per week. Drinks a sugary soda   4 ounces  with a cookie. Appetite is diminished  but weight is stable.   Had cataracts removed bilaterally Nov 2022 by Dr [name] [name]  at [facility].     ? ?2)  Hypertension: patient checks blood pressure twice weekly at home.  Readings have been for the most part < 140/80 at rest . Patient is following a reduce salt diet most days and is taking medications as prescribed  ? ?3) SVT:  has had cardiac evaluation for presumed atria fib.  Zio monitor and ECHO done. No atrial fibrillation  seen. ? ?Outpatient Medications Prior to Visit  ?Medication Sig Dispense Refill  ? aspirin 81 MG tablet Take 81 mg by mouth daily.    ? B-D 3CC LUER-LOK SYR 25GX1" 25G X 1" 3 ML MISC USE AS DIRECTED FOR B12 INJECTIONS 6 each 0  ? blood  glucose meter kit and supplies KIT Use once daily to check blood sugars (90 day supply) 1 each 0  ? carvedilol (COREG) 3.125 MG tablet Take 1 tablet (3.125 mg total) by mouth 2 (two) times daily with a meal. 180 tablet 0  ? Cholecalciferol (D3-1000) 25 MCG (1000 UT) capsule Take 1,000 Units by mouth daily.    ? Choline Fenofibrate (FENOFIBRIC ACID) 135 MG CPDR TAKE 1 CAPSULE BY MOUTH EVERY DAY. 90 capsule 1  ? co-enzyme Q-10 30 MG capsule Take 30 mg by mouth daily.    ? cyanocobalamin (,VITAMIN B-12,) 1000 MCG/ML injection INJECT 1 ML INTO THE MUSCLE EVERY 30 DAYS AS DIRECTED 10 mL 1  ? fish oil-omega-3 fatty acids 1000 MG capsule Take 2 g by mouth daily.    ? glipiZIDE (GLUCOTROL) 5 MG tablet Take 1 tablet (5 mg total) by mouth 2 (two) times daily before a meal. 180 tablet 1  ? metFORMIN (GLUCOPHAGE) 1000 MG tablet TAKE ONE TABLET TWICE A DAY WITH MEALS 60 tablet 2  ? Multiple Vitamin (MULTIVITAMIN) tablet Take 1 tablet by mouth daily.    ? NEEDLE, DISP, 25 G 40J X 1" MISC 1 application by Does not apply route every 30 (thirty) days. 6 each 0  ? rosuvastatin (CRESTOR) 40 MG tablet TAKE ONE TABLET EACH EVENING 90 tablet 1  ? telmisartan (MICARDIS) 40 MG tablet TAKE 1 TABLET BY  MOUTH AT BEDTIME 90 tablet 1  ? triamcinolone cream (KENALOG) 0.1 % Apply topically 2 (two) times daily.    ? ?No facility-administered medications prior to visit.  ? ? ?Review of Systems; ? ?Patient denies headache, fevers, malaise, unintentional weight loss, skin rash, eye pain, sinus congestion and sinus pain, sore throat, dysphagia,  hemoptysis , cough, dyspnea, wheezing, chest pain, palpitations, orthopnea, edema, abdominal pain, nausea, melena, diarrhea, constipation, flank pain, dysuria, hematuria, urinary  Frequency, nocturia, numbness, tingling, seizures,  Focal weakness, Loss of consciousness,  Tremor, insomnia, depression, anxiety, and suicidal ideation.   ? ? ? ?Objective:  ?BP 122/74 (BP Location: Left Arm, Patient Position:  Sitting, Cuff Size: Normal)   Pulse 87   Temp (!) 97.5 ?F (36.4 ?C) (Oral)   Ht _0  (1.753 m)   Wt 196 lb (88.9 kg)   SpO2 95%   BMI 28.94 kg/m?  ? ?BP Readings from Last 3 Encounters:  ?04/29/21 122/74  ?02/17/21 122/60  ?02/06/21 122/66  ? ? ?Wt Readings from Last 3 Encounters:  ?04/29/21 196 lb (88.9 kg)  ?02/17/21 196 lb 12.8 oz (89.3 kg)  ?02/06/21 196 lb (88.9 kg)  ? ? ?General appearance: alert, cooperative and appears stated age ?Ears: normal TM's and external ear canals both ears ?Throat: lips, mucosa, and tongue normal; teeth and gums normal ?Neck: no adenopathy, no carotid bruit, supple, symmetrical, trachea midline and thyroid not enlarged, symmetric, no tenderness/mass/nodules ?Back: symmetric, no curvature. ROM normal. No CVA tenderness. ?Lungs: clear to auscultation bilaterally ?Heart: regular rate and rhythm, S1, S2 normal, no murmur, click, rub or gallop ?Abdomen: soft, non-tender; bowel sounds normal; no masses,  no organomegaly ?Pulses: 2+ and symmetric ?Skin: Skin color, texture, turgor normal. No rashes or lesions ?Lymph nodes: Cervical, supraclavicular, and axillary nodes normal. ? ?Lab Results  ?Component Value Date  ? HGBA1C 7.3 (H) 04/17/2021  ? HGBA1C 7.7 (H) 01/14/2021  ? HGBA1C 7.1 (H) 02/11/2020  ? ? ?Lab Results  ?Component Value Date  ? CREATININE 1.02 04/17/2021  ? CREATININE 1.03 01/14/2021  ? CREATININE 1.15 12/09/2020  ? ? ?Lab Results  ?Component Value Date  ? WBC 7.5 12/09/2020  ? HGB 13.7 12/09/2020  ? HCT 41.2 12/09/2020  ? PLT 278.0 12/09/2020  ? GLUCOSE 135 (H) 04/17/2021  ? CHOL 135 04/17/2021  ? TRIG 120.0 04/17/2021  ? HDL 39.80 04/17/2021  ? LDLDIRECT 75.0 01/14/2021  ? [name] [name] 73 04/17/2021  ? ALT 13 04/17/2021  ? AST 16 04/17/2021  ? NA 138 04/17/2021  ? K 4.3 04/17/2021  ? CL 103 04/17/2021  ? CREATININE 1.02 04/17/2021  ? BUN 18 04/17/2021  ? CO2 29 04/17/2021  ? TSH 0.56 12/09/2020  ? PSA 1.61 08/25/2017  ? HGBA1C 7.3 (H) 04/17/2021  ? MICROALBUR 2.3 (H)  01/14/2021  ? ? ?DG Chest 2 View ? ?Result Date: 12/10/2020 ?CLINICAL DATA:  [name] atrial fibrillation.  Rule out CHF. EXAM: CHEST - 2 VIEW COMPARISON:  None. FINDINGS: Mild diffuse chronic interstitial coarsening. No focal consolidation, pleural effusion or pneumothorax. The cardiac silhouette is within normal limits. Degenerative changes of the spine. No acute osseous pathology. IMPRESSION: No active cardiopulmonary disease. Electronically Signed   By: Anner Crete M.D.   On: 12/10/2020 01:23  ? ? ?Assessment & Plan:  ? ?Problem List Items Addressed This Visit   ? ? Essential hypertension - Primary  ? Relevant Orders  ? Comprehensive metabolic panel  ? Hyperlipidemia associated with type 2 diabetes mellitus ([place])  ?  Currently well-controlled on current medications .  hemoglobin A1c is approaching goal of less than 7.0 . Patient is reminded to schedule an annual eye exam and foot exam is normal today. Patient has no microalbuminuria. Patient is tolerating statin therapy for CAD risk reduction and on ACE/ARB for renal protection and hypertension LDL and triglycerides have been  controlled with rosuvastatin and fenofibrate, and he is taking a baby aspirin daily.  He has no side effects and liver enzymes are normal. ? ?Lab Results  ?Component Value Date  ? HGBA1C 7.3 (H) 04/17/2021  ? ? ?Lab Results  ?Component Value Date  ? CHOL 135 04/17/2021  ? HDL 39.80 04/17/2021  ? [place] 71 04/17/2021  ? LDLDIRECT 75.0 01/14/2021  ? TRIG 120.0 04/17/2021  ? CHOLHDL 3 04/17/2021  ? ?Lab Results  ?Component Value Date  ? ALT 13 04/17/2021  ? AST 16 04/17/2021  ? ALKPHOS 53 04/17/2021  ? BILITOT 0.4 04/17/2021  ? ? ?  ?  ? Relevant Orders  ? Lipid panel  ? LDL cholesterol, direct  ? SVT (supraventricular tachycardia) ([place])  ?  Noted on 14 day Zio monitor.  Continue carvedilol. ?  ?  ? Type 2 DM with diabetic neuropathy affecting both sides of body ([place])  ? Relevant Orders  ? Comprehensive metabolic panel  ? Hemoglobin A1c   ? ?Other Visit Diagnoses   ? ? Colon cancer screening      ? Relevant Orders  ? Ambulatory referral to Gastroenterology  ? ?  ? ? ?Follow-up: Return in about 3 months (around 07/29/2021) for follow up diabetes. ? ? ?[name] [name]

## 2021-04-29 NOTE — Patient Instructions (Signed)
Your diabetes control is improving and your cholesterol levels are excellent! ? ?Continue your current medication regimen and return in 3 months ? ?Get your Tetanus vaccine and your first dose of Shingles vaccine done before your trip to [place] ? ? ?

## 2021-04-30 NOTE — Assessment & Plan Note (Addendum)
Currently well-controlled on current medications .  hemoglobin A1c is approaching goal of less than 7.0 . Patient is reminded to schedule an annual eye exam and foot exam is normal today. Patient has no microalbuminuria. Patient is tolerating statin therapy for CAD risk reduction and on ACE/ARB for renal protection and hypertension LDL and triglycerides have been  controlled with rosuvastatin and fenofibrate, and he is taking a baby aspirin daily.  He has no side effects and liver enzymes are normal. ? ?Lab Results  ?Component Value Date  ? HGBA1C 7.3 (H) 04/17/2021  ? ? ?Lab Results  ?Component Value Date  ? CHOL 135 04/17/2021  ? HDL 39.80 04/17/2021  ? [place] 71 04/17/2021  ? LDLDIRECT 75.0 01/14/2021  ? TRIG 120.0 04/17/2021  ? CHOLHDL 3 04/17/2021  ? ?Lab Results  ?Component Value Date  ? ALT 13 04/17/2021  ? AST 16 04/17/2021  ? ALKPHOS 53 04/17/2021  ? BILITOT 0.4 04/17/2021  ? ? ?

## 2021-04-30 NOTE — Assessment & Plan Note (Addendum)
Noted on 14 day Zio monitor.  Continue carvedilol. ?

## 2021-05-05 ENCOUNTER — Other Ambulatory Visit: Payer: Self-pay | Admitting: Internal Medicine

## 2021-05-26 ENCOUNTER — Other Ambulatory Visit: Payer: Self-pay | Admitting: Internal Medicine

## 2021-06-09 NOTE — Progress Notes (Unsigned)
Follow-up Outpatient Visit Date: 06/10/2021  Primary Care Provider: Sherlene Shams, MD [address] [place] [address]  Chief Complaint: Follow-up PACs and PSVT  HPI:  [name] [name] is a 81 y.o. male with history of premature atrial contractions, paroxysmal supraventricular tachycardia, hypertension, hyperlipidemia, type 2 diabetes mellitus, and peripheral neuropathy, who presents for follow-up of premature atrial contractions.  I met him in December for evaluation of atrial arrhythmia with question of atrial fibrillation that occurred in the setting of cataract surgery.  On my review, EKG was not consistent with atrial fibrillation.  We agreed to place an event monitor, which showed frequent PACs and multiple atrial runs but no evidence of atrial fibrillation.  Echo demonstrated mildly reduced LVEF of 50%.  He was seen in follow-up by Ward Givens, NP, in January.  At that time, he was asymptomatic.  He was continued on low-dose carvedilol.  Today, [name] [name] reports that he continues to feel well.  He denies chest pain, shortness of breath, palpitations, lightheadedness, and edema.  He is not exercising regularly but remains active at home without any limitations.  --------------------------------------------------------------------------------------------------  Cardiovascular History & Procedures: Cardiovascular Problems: PACs and PSVT   Risk Factors: Hypertension, hyperlipidemia, diabetes mellitus, male gender, age greater than 56, and prior tobacco use   Cath/PCI: None   CV Surgery: None   EP Procedures and Devices: 14-day event monitor (12/19/2020): Predominantly sinus rhythm with frequent PACs and multiple atrial runs lasting up to 12 seconds.  No definite evidence of atrial fibrillation.   Non-Invasive Evaluation(s): TTE (01/22/2021): Normal LV size and wall thickness.  LVEF 50% with dyskinetic septum as well as basal inferoposterior hypokinesis.  GLS -16.9%.   Normal RV size and function.  Normal biatrial size.  No pericardial effusion.  Mild tricuspid regurgitation.  Aortic sclerosis without stenosis.  Recent CV Pertinent Labs: Lab Results  Component Value Date   CHOL 135 04/17/2021   HDL 39.80 04/17/2021   LDLCALC 71 04/17/2021   LDLDIRECT 75.0 01/14/2021   TRIG 120.0 04/17/2021   CHOLHDL 3 04/17/2021   K 4.3 04/17/2021   BUN 18 04/17/2021   CREATININE 1.02 04/17/2021   CREATININE 1.14 05/20/2011    Past medical and surgical history were reviewed and updated in EPIC.  Current Meds  Medication Sig   amLODipine (NORVASC) 10 MG tablet TAKE 1 TABLET BY MOUTH DAILY   aspirin 81 MG tablet Take 81 mg by mouth daily.   B-D 3CC LUER-LOK SYR 25GX1" 25G X 1" 3 ML MISC USE AS DIRECTED FOR B12 INJECTIONS   blood glucose meter kit and supplies KIT Use once daily to check blood sugars (90 day supply)   carvedilol (COREG) 3.125 MG tablet TAKE ONE TABLET BY MOUTH TWICE DAILY WITH A MEAL   Cholecalciferol (D3-1000) 25 MCG (1000 UT) capsule Take 1,000 Units by mouth daily.   Choline Fenofibrate (FENOFIBRIC ACID) 135 MG CPDR TAKE 1 CAPSULE BY MOUTH EVERY DAY.   co-enzyme Q-10 30 MG capsule Take 30 mg by mouth daily.   cyanocobalamin (,VITAMIN B-12,) 1000 MCG/ML injection INJECT 1 ML INTO THE MUSCLE EVERY 30 DAYS AS DIRECTED   fish oil-omega-3 fatty acids 1000 MG capsule Take 2 g by mouth daily.   glipiZIDE (GLUCOTROL) 5 MG tablet Take 1 tablet (5 mg total) by mouth 2 (two) times daily before a meal.   metFORMIN (GLUCOPHAGE) 1000 MG tablet TAKE ONE TABLET TWICE A DAY WITH MEALS   Multiple Vitamin (MULTIVITAMIN) tablet Take 1 tablet  by mouth daily.   NEEDLE, DISP, 25 G 38S X 1" MISC 1 application by Does not apply route every 30 (thirty) days.   rosuvastatin (CRESTOR) 40 MG tablet TAKE ONE TABLET EACH EVENING   telmisartan (MICARDIS) 40 MG tablet TAKE 1 TABLET BY MOUTH AT BEDTIME   triamcinolone cream (KENALOG) 0.1 % Apply topically 2 (two) times daily.     Allergies: Neosporin [neomycin-bacitracin zn-polymyx] and Polysporin [bacitracin-polymyxin b]  Social History   Tobacco Use   Smoking status: Former    Packs/day: 2.00    Years: 30.00    Pack years: 60.00    Types: Cigarettes    Quit date: 1982    Years since quitting: 41.4   Smokeless tobacco: Former    Types: Chew    Quit date: 10/19/1990  Vaping Use   Vaping Use: Never used  Substance Use Topics   Alcohol use: Not Currently   Drug use: No    Family History  Problem Relation Age of Onset   Heart attack Father 52   Dementia Sister    Kidney disease Sister    Cancer Neg Hx     Review of Systems: A 12-system review of systems was performed and was negative except as noted in the HPI.  --------------------------------------------------------------------------------------------------  Physical Exam: BP 130/80 (BP Location: Left Arm, Patient Position: Sitting, Cuff Size: Large)   Pulse 86   Ht $R'5\' 10"'Fh$  (1.778 m)   Wt 194 lb (88 kg)   SpO2 98%   BMI 27.84 kg/m   General:  NAD. Neck: No JVD or HJR. Lungs: Clear to auscultation bilaterally without wheezes or crackles. Heart: Regular rate and rhythm without murmurs, rubs, or gallops. Abdomen: Soft, nontender, nondistended. Extremities: No lower extremity edema.  EKG: Normal sinus rhythm with left axis deviation.  Otherwise, no significant abnormality.  No significant change from prior tracing on 12/19/2020.  Lab Results  Component Value Date   WBC 7.5 12/09/2020   HGB 13.7 12/09/2020   HCT 41.2 12/09/2020   MCV 85.8 12/09/2020   PLT 278.0 12/09/2020    Lab Results  Component Value Date   NA 138 04/17/2021   K 4.3 04/17/2021   CL 103 04/17/2021   CO2 29 04/17/2021   BUN 18 04/17/2021   CREATININE 1.02 04/17/2021   GLUCOSE 135 (H) 04/17/2021   ALT 13 04/17/2021    Lab Results  Component Value Date   CHOL 135 04/17/2021   HDL 39.80 04/17/2021   LDLCALC 71 04/17/2021   LDLDIRECT 75.0 01/14/2021    TRIG 120.0 04/17/2021   CHOLHDL 3 04/17/2021    --------------------------------------------------------------------------------------------------  ASSESSMENT AND PLAN: PACs and PSVT: No palpitations reported.  EKG today again shows sinus rhythm.  Event monitor in December showed frequent PACs and a multiple episodes of PSVT lasting up to 12 seconds.  No atrial fibrillation was identified.  We will continue low-dose carvedilol.  No indication for anticoagulation.  Cardiomyopathy: Low normal to mildly reduced LVEF noted by echo in January.  [name] [name] appears euvolemic without signs or symptoms of heart failure.  We will defer additional testing.  Continue current regimen of carvedilol and telmisartan.  Hypertension: Blood pressure borderline today at 130/80, better at prior visits with other providers.  We have agreed to continue current regimen of amlodipine, carvedilol, and telmisartan.  Hyperlipidemia associated with type 2 diabetes mellitus: Lipids reasonable on last check in March.  Continue rosuvastatin per Dr. Derrel Nip as well as ongoing DM management.  Follow-up: Return to clinic  in 1 year. Nelva Bush, MD 06/10/2021 9:44 AM

## 2021-06-10 ENCOUNTER — Ambulatory Visit: Payer: Medicare PPO | Admitting: Internal Medicine

## 2021-06-10 ENCOUNTER — Encounter: Payer: Self-pay | Admitting: Internal Medicine

## 2021-06-10 VITALS — BP 130/80 | HR 86 | Ht 70.0 in | Wt 194.0 lb

## 2021-06-10 DIAGNOSIS — I429 Cardiomyopathy, unspecified: Secondary | ICD-10-CM

## 2021-06-10 DIAGNOSIS — I491 Atrial premature depolarization: Secondary | ICD-10-CM | POA: Diagnosis not present

## 2021-06-10 DIAGNOSIS — I10 Essential (primary) hypertension: Secondary | ICD-10-CM

## 2021-06-10 DIAGNOSIS — E785 Hyperlipidemia, unspecified: Secondary | ICD-10-CM | POA: Diagnosis not present

## 2021-06-10 DIAGNOSIS — I471 Supraventricular tachycardia: Secondary | ICD-10-CM

## 2021-06-10 DIAGNOSIS — E1169 Type 2 diabetes mellitus with other specified complication: Secondary | ICD-10-CM

## 2021-06-10 NOTE — Patient Instructions (Signed)
Medication Instructions:   Your physician recommends that you continue on your current medications as directed. Please refer to the Current Medication list given to you today.  *If you need a refill on your cardiac medications before your next appointment, please call your pharmacy*   Lab Work:  None ordered  Testing/Procedures:  None ordered   Follow-Up: At [facility], you and your health needs are our priority.  As part of our continuing mission to provide you with exceptional heart care, we have created designated Provider Care Teams.  These Care Teams include your primary Cardiologist (physician) and Advanced Practice Providers (APPs -  Physician Assistants and Nurse Practitioners) who all work together to provide you with the care you need, when you need it.  We recommend signing up for the patient portal called "MyChart".  Sign up information is provided on this After Visit Summary.  MyChart is used to connect with patients for Virtual Visits (Telemedicine).  Patients are able to view lab/test results, encounter notes, upcoming appointments, etc.  Non-urgent messages can be sent to your provider as well.   To learn more about what you can do with MyChart, go to [email or web address].com.    Your next appointment:   1 year(s)  The format for your next appointment:   In Person  Provider:   You may see Dr. Christopher End or one of the following Advanced Practice Providers on your designated Care Team:   Christopher Berge, NP Ryan Dunn, PA-C Cadence Furth, PA-C{   Important Information About Sugar       

## 2021-06-12 ENCOUNTER — Other Ambulatory Visit: Payer: Self-pay | Admitting: Internal Medicine

## 2021-06-16 ENCOUNTER — Ambulatory Visit: Payer: Medicare PPO

## 2021-06-16 ENCOUNTER — Telehealth: Payer: Self-pay

## 2021-06-16 NOTE — Telephone Encounter (Signed)
Unable to reach patient for scheduled AWV. No answer. No voicemail. Okay to reschedule patient for another day.

## 2021-06-30 ENCOUNTER — Other Ambulatory Visit: Payer: Self-pay | Admitting: Internal Medicine

## 2021-07-17 ENCOUNTER — Other Ambulatory Visit: Payer: Self-pay | Admitting: Internal Medicine

## 2021-07-28 DIAGNOSIS — M5116 Intervertebral disc disorders with radiculopathy, lumbar region: Secondary | ICD-10-CM | POA: Diagnosis not present

## 2021-07-29 ENCOUNTER — Other Ambulatory Visit (INDEPENDENT_AMBULATORY_CARE_PROVIDER_SITE_OTHER): Payer: Medicare PPO

## 2021-07-29 DIAGNOSIS — E785 Hyperlipidemia, unspecified: Secondary | ICD-10-CM | POA: Diagnosis not present

## 2021-07-29 DIAGNOSIS — E1169 Type 2 diabetes mellitus with other specified complication: Secondary | ICD-10-CM | POA: Diagnosis not present

## 2021-07-29 DIAGNOSIS — I10 Essential (primary) hypertension: Secondary | ICD-10-CM

## 2021-07-29 DIAGNOSIS — E1142 Type 2 diabetes mellitus with diabetic polyneuropathy: Secondary | ICD-10-CM | POA: Diagnosis not present

## 2021-07-29 LAB — COMPREHENSIVE METABOLIC PANEL
ALT: 15 U/L (ref 0–53)
AST: 15 U/L (ref 0–37)
Albumin: 4 g/dL (ref 3.5–5.2)
Alkaline Phosphatase: 58 U/L (ref 39–117)
BUN: 17 mg/dL (ref 6–23)
CO2: 22 mEq/L (ref 19–32)
Calcium: 9.6 mg/dL (ref 8.4–10.5)
Chloride: 102 mEq/L (ref 96–112)
Creatinine, Ser: 0.87 mg/dL (ref 0.40–1.50)
GFR: 82.21 mL/min (ref >60.00)
Glucose, Bld: 192 mg/dL — ABNORMAL HIGH (ref 70–99)
Potassium: 4.3 mEq/L (ref 3.5–5.1)
Sodium: 135 mEq/L (ref 135–145)
Total Bilirubin: 0.4 mg/dL (ref 0.2–1.2)
Total Protein: 6.4 g/dL (ref 6.0–8.3)

## 2021-07-29 LAB — LIPID PANEL
Cholesterol: 125 mg/dL (ref 0–200)
HDL: 41.5 mg/dL (ref >39.00)
LDL Cholesterol: 63 mg/dL (ref 0–99)
NonHDL: 83.79
Total CHOL/HDL Ratio: 3
Triglycerides: 105 mg/dL (ref 0.0–149.0)
VLDL: 21 mg/dL (ref 0.0–40.0)

## 2021-07-29 LAB — LDL CHOLESTEROL, DIRECT: Direct LDL: 69 mg/dL

## 2021-07-29 LAB — HEMOGLOBIN A1C: Hgb A1c MFr Bld: 8 % — ABNORMAL HIGH (ref 4.6–6.5)

## 2021-07-31 DIAGNOSIS — M5442 Lumbago with sciatica, left side: Secondary | ICD-10-CM | POA: Diagnosis not present

## 2021-07-31 DIAGNOSIS — M5441 Lumbago with sciatica, right side: Secondary | ICD-10-CM | POA: Diagnosis not present

## 2021-07-31 DIAGNOSIS — M9903 Segmental and somatic dysfunction of lumbar region: Secondary | ICD-10-CM | POA: Diagnosis not present

## 2021-08-04 DIAGNOSIS — M4807 Spinal stenosis, lumbosacral region: Secondary | ICD-10-CM | POA: Diagnosis not present

## 2021-08-04 DIAGNOSIS — M5116 Intervertebral disc disorders with radiculopathy, lumbar region: Secondary | ICD-10-CM | POA: Diagnosis not present

## 2021-08-05 ENCOUNTER — Ambulatory Visit: Payer: Medicare PPO | Admitting: Internal Medicine

## 2021-08-24 DIAGNOSIS — M47816 Spondylosis without myelopathy or radiculopathy, lumbar region: Secondary | ICD-10-CM | POA: Diagnosis not present

## 2021-08-24 DIAGNOSIS — M4807 Spinal stenosis, lumbosacral region: Secondary | ICD-10-CM | POA: Diagnosis not present

## 2021-08-24 DIAGNOSIS — M5136 Other intervertebral disc degeneration, lumbar region: Secondary | ICD-10-CM | POA: Diagnosis not present

## 2021-08-31 DIAGNOSIS — M21371 Foot drop, right foot: Secondary | ICD-10-CM | POA: Diagnosis not present

## 2021-08-31 DIAGNOSIS — M21372 Foot drop, left foot: Secondary | ICD-10-CM | POA: Diagnosis not present

## 2021-08-31 DIAGNOSIS — M5116 Intervertebral disc disorders with radiculopathy, lumbar region: Secondary | ICD-10-CM | POA: Diagnosis not present

## 2021-09-08 DIAGNOSIS — M5116 Intervertebral disc disorders with radiculopathy, lumbar region: Secondary | ICD-10-CM | POA: Diagnosis not present

## 2021-09-14 ENCOUNTER — Ambulatory Visit (INDEPENDENT_AMBULATORY_CARE_PROVIDER_SITE_OTHER): Payer: Medicare PPO

## 2021-09-14 VITALS — Ht 70.0 in | Wt 194.0 lb

## 2021-09-14 DIAGNOSIS — Z0000 Encounter for general adult medical examination without abnormal findings: Secondary | ICD-10-CM | POA: Diagnosis not present

## 2021-09-14 NOTE — Progress Notes (Addendum)
Subjective:   [name] [name] is a 81 y.o. male who presents for Medicare Annual/Subsequent preventive examination.  Review of Systems    No ROS.  Medicare Wellness Virtual Visit.  Visual/audio telehealth visit, UTA vital signs.   See social history for additional risk factors.   Cardiac Risk Factors include: advanced age (>57men, >5 women);male gender;hypertension;diabetes mellitus     Objective:    Today's Vitals   09/14/21 0940  Weight: 194 lb (88 kg)  Height: 5\' 10"  (1.778 m)   Body mass index is 27.84 kg/m.     09/14/2021    9:39 AM 12/08/2020    7:06 AM 11/24/2020    7:37 AM 06/09/2020    1:41 PM 06/07/2019    1:23 PM 06/10/2017    8:36 AM 06/09/2016    8:17 AM  Advanced Directives  Does Patient Have a Medical Advance Directive? Yes Yes Yes Yes Yes Yes Yes  Type of Estate agent of [place];Living will Healthcare Power of [place];Living will Living will;Healthcare Power of [employer] Power of [place];Living will Healthcare Power of [place];Living will Healthcare Power of [place];Living will Healthcare Power of [place];Living will  Does patient want to make changes to medical advance directive? No - Patient declined No - Patient declined No - Patient declined No - Patient declined No - Patient declined No - Patient declined No - Patient declined  Copy of Healthcare Power of Attorney in Chart? No - copy requested No - copy requested No - copy requested Yes - validated most recent copy scanned in chart (See row information) No - copy requested No - copy requested No - copy requested    Current Medications (verified) Outpatient Encounter Medications as of 09/14/2021  Medication Sig   amLODipine (NORVASC) 10 MG tablet TAKE 1 TABLET BY MOUTH DAILY   aspirin 81 MG tablet Take 81 mg by mouth daily.   B-D 3CC LUER-LOK SYR 25GX1" 25G X 1" 3 ML MISC USE AS DIRECTED FOR B12 INJECTIONS   blood glucose meter kit and supplies KIT Use once daily to  check blood sugars (90 day supply)   carvedilol (COREG) 3.125 MG tablet TAKE ONE TABLET BY MOUTH TWICE DAILY WITH A MEAL   Cholecalciferol (D3-1000) 25 MCG (1000 UT) capsule Take 1,000 Units by mouth daily.   Choline Fenofibrate (FENOFIBRIC ACID) 135 MG CPDR TAKE 1 CAPSULE BY MOUTH EVERY DAY.   co-enzyme Q-10 30 MG capsule Take 30 mg by mouth daily.   cyanocobalamin (,VITAMIN B-12,) 1000 MCG/ML injection INJECT 1 ML INTO THE MUSCLE EVERY 30 DAYS AS DIRECTED   fish oil-omega-3 fatty acids 1000 MG capsule Take 2 g by mouth daily.   glipiZIDE (GLUCOTROL) 5 MG tablet Take 1 tablet (5 mg total) by mouth 2 (two) times daily before a meal.   metFORMIN (GLUCOPHAGE) 1000 MG tablet TAKE ONE TABLET TWICE A DAY WITH MEALS   Multiple Vitamin (MULTIVITAMIN) tablet Take 1 tablet by mouth daily.   NEEDLE, DISP, 25 G 25G X 1" MISC 1 application by Does not apply route every 30 (thirty) days.   rosuvastatin (CRESTOR) 40 MG tablet TAKE ONE TABLET EACH EVENING   telmisartan (MICARDIS) 40 MG tablet TAKE 1 TABLET BY MOUTH AT BEDTIME   triamcinolone cream (KENALOG) 0.1 % Apply topically 2 (two) times daily.   No facility-administered encounter medications on file as of 09/14/2021.    Allergies (verified) Neosporin [neomycin-bacitracin zn-polymyx] and Polysporin [bacitracin-polymyxin b]   History: Past Medical History:  Diagnosis Date  Abnormal echocardiogram    a. 01/2021 Echo: EF 50%, septal DK (bbb/IVCD), infero/posterior HK. GrI DD. Nl RV fxn. Ao root 37mm. Asc Ao 36mm.   Cataracts, bilateral    Diabetes mellitus    Type 2   Hyperlipidemia    Hypertension    Peripheral neuropathy 01/19/2007   positive EMG studies, negative workup for causes   Positive PPD, treated 01/19/1968   Premature atrial contractions    a. 12/2020 Zio: predominantly RSR, 67 (47-107). Freq PACs (7-8% burden). Rare PVCs. 149 atrial runs (longest 11.5 secs, max rate 193). No sustained arrhythmias/pauses; no afib/flutter.   Past  Surgical History:  Procedure Laterality Date   CATARACT EXTRACTION W/PHACO Right 11/24/2020   Procedure: CATARACT EXTRACTION PHACO AND INTRAOCULAR LENS PLACEMENT (IOC) RIGHT DIABETIC 4.17 00:33.8;  Surgeon: Nevada Crane, MD;  Location: [facility];  Service: Ophthalmology;  Laterality: Right;   CATARACT EXTRACTION W/PHACO Left 12/08/2020   Procedure: CATARACT EXTRACTION PHACO AND INTRAOCULAR LENS PLACEMENT (IOC) LEFT DIABETIC;  Surgeon: Nevada Crane, MD;  Location: [facility];  Service: Ophthalmology;  Laterality: Left;  8.81 00:44.4   COLONOSCOPY  2004, 2014   Dr Lemar Livings   COLONOSCOPY WITH PROPOFOL N/A 04/30/2015   Procedure: COLONOSCOPY WITH PROPOFOL;  Surgeon: Earline Mayotte, MD;  Location: [facility];  Service: Endoscopy;  Laterality: N/A;   ROTATOR CUFF REPAIR  2005   right (Dr. Mack Guise)   TONSILLECTOMY     WRIST FRACTURE SURGERY Left    Armour, Ted    Family History  Problem Relation Age of Onset   Heart attack Father 28   Dementia Sister    Kidney disease Sister    Cancer Neg Hx    Social History   Socioeconomic History   Marital status: Married    Spouse name: Not on file   Number of children: Not on file   Years of education: Not on file   Highest education level: Not on file  Occupational History   Not on file  Tobacco Use   Smoking status: Former    Packs/day: 2.00    Years: 30.00    Total pack years: 60.00    Types: Cigarettes    Quit date: 31    Years since quitting: 41.6   Smokeless tobacco: Former    Types: Chew    Quit date: 10/19/1990  Vaping Use   Vaping Use: Never used  Substance and Sexual Activity   Alcohol use: Not Currently   Drug use: No   Sexual activity: Not Currently  Other Topics Concern   Not on file  Social History Narrative   Not on file   Social Determinants of Health   Financial Resource Strain: Low Risk  (09/14/2021)   Overall Financial Resource Strain (CARDIA)    Difficulty of Paying Living  Expenses: Not hard at all  Food Insecurity: No Food Insecurity (09/14/2021)   Hunger Vital Sign    Worried About Running Out of Food in the Last Year: Never true    Ran Out of Food in the Last Year: Never true  Transportation Needs: No Transportation Needs (09/14/2021)   PRAPARE - Administrator, Civil Service (Medical): No    Lack of Transportation (Non-Medical): No  Physical Activity: Sufficiently Active (09/14/2021)   Exercise Vital Sign    Days of Exercise per Week: 3 days    Minutes of Exercise per Session: 60 min  Stress: No Stress Concern Present (09/14/2021)   [employer] of  Occupational Health - Occupational Stress Questionnaire    Feeling of Stress : Not at all  Social Connections: Unknown (09/14/2021)   Social Connection and Isolation Panel [NHANES]    Frequency of Communication with Friends and Family: Not on file    Frequency of Social Gatherings with Friends and Family: Not on file    Attends Religious Services: Not on Marketing executive or Organizations: Not on file    Attends Banker Meetings: Not on file    Marital Status: Married    Tobacco Counseling Counseling given: Not Answered  Clinical Intake: Pre-visit preparation completed: Yes        Diabetes: Yes (Followed by PCP)  How often do you need to have someone help you when you read instructions, pamphlets, or other written materials from your doctor or pharmacy?: 1 - Never  Interpreter Needed?: No    Activities of Daily Living    09/14/2021    9:44 AM 12/08/2020    6:57 AM  In your present state of health, do you have any difficulty performing the following activities:  Hearing? 0 0  Vision? 0 0  Difficulty concentrating or making decisions? 0 0  Walking or climbing stairs? 0 0  Comment Cane in use as needed. Paces self with activity.   Dressing or bathing? 0 0  Doing errands, shopping? 0   Preparing Food and eating ? N   Using the Toilet? N   In the  past six months, have you accidently leaked urine? N   Do you have problems with loss of bowel control? N   Managing your Medications? N   Managing your Finances? N   Housekeeping or managing your Housekeeping? N     Patient Care Team: Sherlene Shams, MD as PCP - General (Internal Medicine) End, Cristal Deer, MD as PCP - Cardiology (Cardiology) Lemar Livings Merrily Pew, MD (General Surgery) Sherlene Shams, MD (Internal Medicine)  Indicate any recent Medical Services you may have received from other than [facility] providers in the past year (date may be approximate).     Assessment:   This is a routine wellness examination for [name] [name].  Virtual Visit via Telephone Note  I connected with  [name] [name] on 09/14/21 at  9:30 AM EDT by telephone and verified that I am speaking with the correct person using two identifiers.  Location: Patient: home Provider: office Persons participating in the virtual visit: patient/Nurse Health Advisor   I discussed the limitations of performing an evaluation and management service by telehealth. We continued and completed visit with audio only. Some vital signs may be absent or patient reported.   Hearing/Vision screen Hearing Screening - Comments:: Patient is able to hear conversational tones without difficulty. No issues reported. Vision Screening - Comments:: Followed by [facility]  Wears corrective lenses  Cataracts extracted, bilateral  They have regular follow up with the ophthalmologist  Dietary issues and exercise activities discussed: Current Exercise Habits: Home exercise routine, Intensity: Mild Regular diet   Goals Addressed               This Visit's Progress     Patient Stated     Maintain Healthy Lifestyle (pt-stated)        Stay active. Healthy diet.       Depression Screen    09/14/2021    9:41 AM 04/29/2021   10:29 AM 02/17/2021    2:36 PM 01/16/2021    8:21 AM 12/09/2020  3:20 PM 06/09/2020    1:27 PM  06/07/2019    1:25 PM  PHQ 2/9 Scores  PHQ - 2 Score 0 0 0 0 0 0 0    Fall Risk    09/14/2021    9:44 AM 04/29/2021   10:29 AM 02/17/2021    2:36 PM 01/16/2021    8:20 AM 12/09/2020    3:20 PM  Fall Risk   Falls in the past year? 0 0 0 0 1  Number falls in past yr: 0    1  Injury with Fall? 0    0  Risk for fall due to :  No Fall Risks History of fall(s) History of fall(s)   Follow up Falls evaluation completed Falls evaluation completed Falls evaluation completed Falls evaluation completed Falls evaluation completed    FALL RISK PREVENTION PERTAINING TO THE HOME: Home free of loose throw rugs in walkways, pet beds, electrical cords, etc? Yes  Adequate lighting in your home to reduce risk of falls? Yes   ASSISTIVE DEVICES UTILIZED TO PREVENT FALLS: Life alert? No  Use of a cane, walker or w/c? Yes , cane as needed.   TIMED UP AND GO: Was the test performed? No .   Cognitive Function:    06/09/2016    8:47 AM 06/10/2015    8:27 AM  MMSE - Mini Mental State Exam  Orientation to time 5 5  Orientation to Place 5 5  Registration 3 3  Attention/ Calculation 5 5  Recall 3 3  Language- name 2 objects 2 2  Language- repeat 1 1  Language- follow 3 step command 3 3  Language- read & follow direction 1 1  Write a sentence 1 1  Copy design 1 1  Total score 30 30        09/14/2021    9:46 AM 06/07/2019    1:26 PM 06/10/2017    8:51 AM  6CIT Screen  What Year? 0 points 0 points 0 points  What month? 0 points 0 points 0 points  What time? 0 points 0 points 0 points  Count back from 20 0 points 0 points 0 points  Months in reverse 0 points 0 points 0 points  Repeat phrase 0 points 0 points 0 points  Total Score 0 points 0 points 0 points    Immunizations Immunization History  Administered Date(s) Administered   Fluad Quad(high Dose 65+) 09/15/2018, 10/15/2019   Influenza Split 09/29/2011   Influenza, High Dose Seasonal PF 11/11/2014, 09/17/2015, 10/22/2016, 11/03/2017,  12/30/2020   Influenza,inj,Quad PF,6+ Mos 10/31/2012, 10/13/2013   Moderna Sars-Covid-2 Vaccination 01/30/2019, 02/27/2019, 03/07/2020   Pneumococcal Conjugate-13 04/16/2013   Pneumococcal Polysaccharide-23 07/15/2010, 03/14/2015   Tdap 06/29/2010, 04/29/2021   Zoster Recombinat (Shingrix) 09/14/2017, 11/22/2017   Zoster, Live 05/11/2010   Screening Tests Health Maintenance  Topic Date Due   INFLUENZA VACCINE  08/18/2021   COVID-19 Vaccine (4 - Moderna risk series) 09/30/2021 (Originally 05/02/2020)   COLONOSCOPY (Pts 45-70yrs Insurance coverage will need to be confirmed)  10/18/2021 (Originally 04/29/2020)   OPHTHALMOLOGY EXAM  10/08/2021   HEMOGLOBIN A1C  01/29/2022   FOOT EXAM  04/30/2022   TETANUS/TDAP  04/30/2031   Pneumonia Vaccine 64+ Years old  Completed   Hepatitis C Screening  Completed   Zoster Vaccines- Shingrix  Completed   HPV VACCINES  Aged Out   Health Maintenance Health Maintenance Due  Topic Date Due   INFLUENZA VACCINE  08/18/2021   DG Chest 2 View: Completed  12/09/20.  Vision Screening: Recommended annual ophthalmology exams for early detection of glaucoma and other disorders of the eye.  Dental Screening: Recommended annual dental exams for proper oral hygiene.  Community Resource Referral / Chronic Care Management: CRR required this visit?  No   CCM required this visit?  No      Plan:     I have personally reviewed and noted the following in the patient's chart:   Medical and social history Use of alcohol, tobacco or illicit drugs  Current medications and supplements including opioid prescriptions. Patient is not currently taking opioid prescriptions. Functional ability and status Nutritional status Physical activity Advanced directives List of other physicians Hospitalizations, surgeries, and ER visits in previous 12 months Vitals Screenings to include cognitive, depression, and falls Referrals and appointments  In addition, I have  reviewed and discussed with patient certain preventive protocols, quality metrics, and best practice recommendations. A written personalized care plan for preventive services as well as general preventive health recommendations were provided to patient.     OBrien-Blaney, Derwood Becraft L, LPN   1/61/0960    I have reviewed the above information and agree with above.   Duncan Dull, MD

## 2021-09-14 NOTE — Patient Instructions (Addendum)
[name] [name] , Thank you for taking time to come for your Medicare Wellness Visit. I appreciate your ongoing commitment to your health goals. Please review the following plan we discussed and let me know if I can assist you in the future.   These are the goals we discussed:  Goals       Patient Stated     Maintain Healthy Lifestyle (pt-stated)      Stay active. Healthy diet.        This is a list of the screening recommended for you and due dates:  Health Maintenance  Topic Date Due   Flu Shot  08/18/2021   COVID-19 Vaccine (4 - Moderna risk series) 09/30/2021*   Colon Cancer Screening  10/18/2021*   Eye exam for diabetics  10/08/2021   Hemoglobin A1C  01/29/2022   Complete foot exam   04/30/2022   Tetanus Vaccine  04/30/2031   Pneumonia Vaccine  Completed   Hepatitis C Screening: USPSTF Recommendation to screen - Ages 94-79 yo.  Completed   Zoster (Shingles) Vaccine  Completed   HPV Vaccine  Aged Out  *Topic was postponed. The date shown is not the original due date.    Preventive Care 23 Years and Older, Male Preventive care refers to lifestyle choices and visits with your health care provider that can promote health and wellness. What does preventive care include? A yearly physical exam. This is also called an annual well check. Dental exams once or twice a year. Routine eye exams. Ask your health care provider how often you should have your eyes checked. Personal lifestyle choices, including: Daily care of your teeth and gums. Regular physical activity. Eating a healthy diet. Avoiding tobacco and drug use. Limiting alcohol use. Practicing safe sex. Taking low doses of aspirin every day. Taking vitamin and mineral supplements as recommended by your health care provider. What happens during an annual well check? The services and screenings done by your health care provider during your annual well check will depend on your age, overall health, lifestyle risk factors,  and family history of disease. Counseling  Your health care provider may ask you questions about your: Alcohol use. Tobacco use. Drug use. Emotional well-being. Home and relationship well-being. Sexual activity. Eating habits. History of falls. Memory and ability to understand (cognition). Work and work Statistician. Screening  You may have the following tests or measurements: Height, weight, and BMI. Blood pressure. Lipid and cholesterol levels. These may be checked every 5 years, or more frequently if you are over 41 years old. Skin check. Lung cancer screening. You may have this screening every year starting at age 38 if you have a 30-pack-year history of smoking and currently smoke or have quit within the past 15 years. Fecal occult blood test (FOBT) of the stool. You may have this test every year starting at age 33. Flexible sigmoidoscopy or colonoscopy. You may have a sigmoidoscopy every 5 years or a colonoscopy every 10 years starting at age 52. Prostate cancer screening. Recommendations will vary depending on your family history and other risks. Hepatitis C blood test. Hepatitis B blood test. Sexually transmitted disease (STD) testing. Diabetes screening. This is done by checking your blood sugar (glucose) after you have not eaten for a while (fasting). You may have this done every 1-3 years. Abdominal aortic aneurysm (AAA) screening. You may need this if you are a current or former smoker. Osteoporosis. You may be screened starting at age 64 if you are at high risk.  Talk with your health care provider about your test results, treatment options, and if necessary, the need for more tests. Vaccines  Your health care provider may recommend certain vaccines, such as: Influenza vaccine. This is recommended every year. Tetanus, diphtheria, and acellular pertussis (Tdap, Td) vaccine. You may need a Td booster every 10 years. Zoster vaccine. You may need this after age  33. Pneumococcal 13-valent conjugate (PCV13) vaccine. One dose is recommended after age 41. Pneumococcal polysaccharide (PPSV23) vaccine. One dose is recommended after age 65. Talk to your health care provider about which screenings and vaccines you need and how often you need them. This information is not intended to replace advice given to you by your health care provider. Make sure you discuss any questions you have with your health care provider. Document Released: 01/31/2015 Document Revised: 09/24/2015 Document Reviewed: 11/05/2014 Elsevier Interactive Patient Education  2017 [place] Prevention in the Home Falls can cause injuries. They can happen to people of all ages. There are many things you can do to make your home safe and to help prevent falls. What can I do on the outside of my home? Regularly fix the edges of walkways and driveways and fix any cracks. Remove anything that might make you trip as you walk through a door, such as a raised step or threshold. Trim any bushes or trees on the path to your home. Use bright outdoor lighting. Clear any walking paths of anything that might make someone trip, such as rocks or tools. Regularly check to see if handrails are loose or broken. Make sure that both sides of any steps have handrails. Any raised decks and porches should have guardrails on the edges. Have any leaves, snow, or ice cleared regularly. Use sand or salt on walking paths during winter. Clean up any spills in your garage right away. This includes oil or grease spills. What can I do in the bathroom? Use night lights. Install grab bars by the toilet and in the tub and shower. Do not use towel bars as grab bars. Use non-skid mats or decals in the tub or shower. If you need to sit down in the shower, use a plastic, non-slip stool. Keep the floor dry. Clean up any water that spills on the floor as soon as it happens. Remove soap buildup in the tub or shower  regularly. Attach bath mats securely with double-sided non-slip rug tape. Do not have throw rugs and other things on the floor that can make you trip. What can I do in the bedroom? Use night lights. Make sure that you have a light by your bed that is easy to reach. Do not use any sheets or blankets that are too big for your bed. They should not hang down onto the floor. Have a firm chair that has side arms. You can use this for support while you get dressed. Do not have throw rugs and other things on the floor that can make you trip. What can I do in the kitchen? Clean up any spills right away. Avoid walking on wet floors. Keep items that you use a lot in easy-to-reach places. If you need to reach something above you, use a strong step stool that has a grab bar. Keep electrical cords out of the way. Do not use floor polish or wax that makes floors slippery. If you must use wax, use non-skid floor wax. Do not have throw rugs and other things on the floor that can make  you trip. What can I do with my stairs? Do not leave any items on the stairs. Make sure that there are handrails on both sides of the stairs and use them. Fix handrails that are broken or loose. Make sure that handrails are as long as the stairways. Check any carpeting to make sure that it is firmly attached to the stairs. Fix any carpet that is loose or worn. Avoid having throw rugs at the top or bottom of the stairs. If you do have throw rugs, attach them to the floor with carpet tape. Make sure that you have a light switch at the top of the stairs and the bottom of the stairs. If you do not have them, ask someone to add them for you. What else can I do to help prevent falls? Wear shoes that: Do not have high heels. Have rubber bottoms. Are comfortable and fit you well. Are closed at the toe. Do not wear sandals. If you use a stepladder: Make sure that it is fully opened. Do not climb a closed stepladder. Make sure that  both sides of the stepladder are locked into place. Ask someone to hold it for you, if possible. Clearly mark and make sure that you can see: Any grab bars or handrails. First and last steps. Where the edge of each step is. Use tools that help you move around (mobility aids) if they are needed. These include: Canes. Walkers. Scooters. Crutches. Turn on the lights when you go into a dark area. Replace any light bulbs as soon as they burn out. Set up your furniture so you have a clear path. Avoid moving your furniture around. If any of your floors are uneven, fix them. If there are any pets around you, be aware of where they are. Review your medicines with your doctor. Some medicines can make you feel dizzy. This can increase your chance of falling. Ask your doctor what other things that you can do to help prevent falls. This information is not intended to replace advice given to you by your health care provider. Make sure you discuss any questions you have with your health care provider. Document Released: 10/31/2008 Document Revised: 06/12/2015 Document Reviewed: 02/08/2014 Elsevier Interactive Patient Education  2017 [employer].

## 2021-09-16 ENCOUNTER — Other Ambulatory Visit: Payer: Self-pay | Admitting: Internal Medicine

## 2021-09-25 ENCOUNTER — Other Ambulatory Visit: Payer: Self-pay | Admitting: Internal Medicine

## 2021-10-14 DIAGNOSIS — E119 Type 2 diabetes mellitus without complications: Secondary | ICD-10-CM | POA: Diagnosis not present

## 2021-10-14 LAB — HM DIABETES EYE EXAM

## 2021-10-21 ENCOUNTER — Other Ambulatory Visit: Payer: Self-pay | Admitting: Internal Medicine

## 2021-10-21 DIAGNOSIS — G4733 Obstructive sleep apnea (adult) (pediatric): Secondary | ICD-10-CM | POA: Diagnosis not present

## 2021-10-21 DIAGNOSIS — M5116 Intervertebral disc disorders with radiculopathy, lumbar region: Secondary | ICD-10-CM | POA: Diagnosis not present

## 2021-10-21 DIAGNOSIS — M21372 Foot drop, left foot: Secondary | ICD-10-CM | POA: Diagnosis not present

## 2021-10-21 DIAGNOSIS — G608 Other hereditary and idiopathic neuropathies: Secondary | ICD-10-CM | POA: Diagnosis not present

## 2021-10-21 DIAGNOSIS — M21371 Foot drop, right foot: Secondary | ICD-10-CM | POA: Diagnosis not present

## 2021-10-21 DIAGNOSIS — M4807 Spinal stenosis, lumbosacral region: Secondary | ICD-10-CM | POA: Diagnosis not present

## 2021-10-22 ENCOUNTER — Other Ambulatory Visit: Payer: Self-pay | Admitting: Internal Medicine

## 2021-10-27 ENCOUNTER — Other Ambulatory Visit: Payer: Self-pay | Admitting: Internal Medicine

## 2021-10-28 ENCOUNTER — Encounter: Payer: Self-pay | Admitting: Internal Medicine

## 2021-10-28 ENCOUNTER — Ambulatory Visit: Payer: Medicare PPO | Admitting: Internal Medicine

## 2021-10-28 DIAGNOSIS — Z23 Encounter for immunization: Secondary | ICD-10-CM | POA: Diagnosis not present

## 2021-10-28 DIAGNOSIS — M545 Low back pain, unspecified: Secondary | ICD-10-CM

## 2021-10-28 DIAGNOSIS — G4733 Obstructive sleep apnea (adult) (pediatric): Secondary | ICD-10-CM

## 2021-10-28 DIAGNOSIS — G8929 Other chronic pain: Secondary | ICD-10-CM

## 2021-10-28 DIAGNOSIS — M21372 Foot drop, left foot: Secondary | ICD-10-CM | POA: Diagnosis not present

## 2021-10-28 DIAGNOSIS — I10 Essential (primary) hypertension: Secondary | ICD-10-CM

## 2021-10-28 DIAGNOSIS — E785 Hyperlipidemia, unspecified: Secondary | ICD-10-CM

## 2021-10-28 DIAGNOSIS — M5387 Other specified dorsopathies, lumbosacral region: Secondary | ICD-10-CM

## 2021-10-28 DIAGNOSIS — M21371 Foot drop, right foot: Secondary | ICD-10-CM | POA: Diagnosis not present

## 2021-10-28 DIAGNOSIS — E1169 Type 2 diabetes mellitus with other specified complication: Secondary | ICD-10-CM | POA: Diagnosis not present

## 2021-10-28 DIAGNOSIS — E1142 Type 2 diabetes mellitus with diabetic polyneuropathy: Secondary | ICD-10-CM

## 2021-10-28 DIAGNOSIS — Z1211 Encounter for screening for malignant neoplasm of colon: Secondary | ICD-10-CM

## 2021-10-28 MED ORDER — DAPAGLIFLOZIN PROPANEDIOL 5 MG PO TABS: 5.0000 mg | ORAL_TABLET | Freq: Every day | ORAL | 2 refills | Status: DC

## 2021-10-28 MED ORDER — RSVPREF3 VAC RECOMB ADJUVANTED 120 MCG/0.5ML IM SUSR: 0.5000 mL | Freq: Once | INTRAMUSCULAR | 0 refills | Status: AC

## 2021-10-28 MED ORDER — BLOOD GLUCOSE MONITOR KIT: PACK | 0 refills | Status: AC

## 2021-10-28 NOTE — Patient Instructions (Addendum)
I am recommending a new medication to help control your diabetes called FARXIGA.  Wilder Glade  is an SLGT 2  Inhibitor .  It works to lower blood sugars by routing the excess sugar through the kidneys into the urine. It does make you urinate more, but it has been nproven to have a  protective effect on  your heart .  It actually lowers  your risk for heart attack   if you have already had heart trouble  . I have sent it to your mail order pharmacy to save you money.   Continue glipizide and metforimn  Please check your blood sugars ONCE A DAY :  EITHER  1) fasting OR 2 hours after a meal (you can vary from day to day which meal you check)  Send me your blood sugars in 2 weeks through Mychart or fax/call the office. If your sugars are still too high we will need to change your medications or add additional medications depending on your blood sugars.

## 2021-10-28 NOTE — Assessment & Plan Note (Signed)
Diagnosed by sleep study. he is wearing her CPAP every night a minimum of 6 hours per night and notes improved daytime wakefulness and decreased fatigue  

## 2021-10-28 NOTE — Assessment & Plan Note (Signed)
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  Advised to - Continue to use cane for ambulation  and  Continue to use AFOs   

## 2021-10-28 NOTE — Assessment & Plan Note (Signed)
S/p left L4/L5 transforaminal epidural injection by [facility]  Sept 22.  Has avoided yardwork  But rides a riding lawnmower

## 2021-10-28 NOTE — Assessment & Plan Note (Addendum)
Loss of cntrol by last a1c,  Adding Farxiga 5 mg to current regimen of metformin and glipizide.  Instructed on use of glucometer to check bs once daily ar various times . Continue asa,  ARB and statin  Return for a1c in > 24 hours

## 2021-10-28 NOTE — Assessment & Plan Note (Signed)
Currently well-controlled on current medications .   LDL and triglycerides have been  controlled with rosuvastatin and fenofibrate, and he is taking a baby aspirin daily.  He has no side effects and liver enzymes are normal.   Lab Results  Component Value Date   CHOL 125 07/29/2021   HDL 41.50 07/29/2021   LDLCALC 63 07/29/2021   LDLDIRECT 69.0 07/29/2021   TRIG 105.0 07/29/2021   CHOLHDL 3 07/29/2021   Lab Results  Component Value Date   ALT 15 07/29/2021   AST 15 07/29/2021   ALKPHOS 58 07/29/2021   BILITOT 0.4 07/29/2021

## 2021-10-28 NOTE — Progress Notes (Signed)
Subjective:  Patient ID: [name] [name], male    DOB: Oct 20, 1940  Age: 81 y.o. MRN: [id number]  CC: The primary encounter diagnosis was Colon cancer screening. 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   HPI EOGHAN BELCHER presents for  Chief Complaint  Patient presents with   Follow-up    Follow up on diabetes, hypertension, hyperlipidemia   1) Reviewed Neurology Office Visit  on Oct 4  and [facility]  visit on August 22 at which time an  ESI was done L3-4 ( his second one , both times got good relief  of back pain )  ) 2) type 2 DM:  his last a1c was 8.0 and he was asked to bring a log of his Sugars  to today's visit,  However he does not check his MyChart so he did not receive message.  He does not check sugars,  has not used a glucometer in months   3) HTN:  Hypertension: patient checks blood pressure twice weekly at home.  Readings have been for the most part < 140/80 at rest . Patient is following a reduce salt diet most days and is taking medications as prescribed  Reviewed his recent cardiology evaluation and ECHO   Outpatient Medications Prior to Visit  Medication Sig Dispense Refill   amLODipine (NORVASC) 10 MG tablet TAKE 1 TABLET BY MOUTH DAILY 90 tablet 1   aspirin 81 MG tablet Take 81 mg by mouth daily.     B-D 3CC LUER-LOK SYR 25GX1" 25G X 1" 3 ML MISC USE AS DIRECTED FOR B12 INJECTIONS 6 each 0   carvedilol (COREG) 3.125 MG tablet TAKE ONE TABLET BY MOUTH TWICE DAILY WITH A MEAL 180 tablet 0   Cholecalciferol (D3-1000) 25 MCG (1000 UT) capsule Take 1,000 Units by mouth daily.     Choline Fenofibrate  (FENOFIBRIC ACID) 135 MG CPDR TAKE 1 CAPSULE BY MOUTH EVERY DAY. 90 capsule 1   co-enzyme Q-10 30 MG capsule Take 30 mg by mouth daily.     cyanocobalamin (,VITAMIN B-12,) 1000 MCG/ML injection INJECT 1 ML INTO THE MUSCLE EVERY 30 DAYS AS DIRECTED 10 mL 1   fish oil-omega-3 fatty acids 1000 MG capsule Take 2 g by mouth daily.     glipiZIDE (GLUCOTROL) 5 MG tablet TAKE ONE TABLET BY MOUTH TWICE DAILY BEFORE A MEAL 180 tablet 1   metFORMIN (GLUCOPHAGE) 1000 MG tablet TAKE ONE TABLET TWICE A DAY WITH MEALS 60 tablet 2   Multiple Vitamin (MULTIVITAMIN) tablet Take 1 tablet by mouth daily.     NEEDLE, DISP, 25 G 25G X 1" MISC 1 application by Does not apply route every 30 (thirty) days. 6 each 0   rosuvastatin (CRESTOR) 40 MG tablet TAKE ONE TABLET EACH EVENING 90 tablet 1   telmisartan (MICARDIS) 40 MG tablet TAKE 1 TABLET BY MOUTH AT BEDTIME 90 tablet 1   triamcinolone cream (KENALOG) 0.1 % Apply topically 2 (two) times daily.     blood glucose meter kit and supplies KIT Use once daily to check blood sugars (90 day supply) 1 each 0   No facility-administered medications prior to visit.    Review of Systems;  Patient denies  headache, fevers, malaise, unintentional weight loss, skin rash, eye pain, sinus congestion and sinus pain, sore throat, dysphagia,  hemoptysis , cough, dyspnea, wheezing, chest pain, palpitations, orthopnea, edema, abdominal pain, nausea, melena, diarrhea, constipation, flank pain, dysuria, hematuria, urinary  Frequency, nocturia, numbness, tingling, seizures,  Focal weakness, Loss of consciousness,  Tremor, insomnia, depression, anxiety, and suicidal ideation.      Objective:  There were no vitals taken for this visit.  BP Readings from Last 3 Encounters:  06/10/21 130/80  04/29/21 122/74  02/17/21 122/60    Wt Readings from Last 3 Encounters:  09/14/21 194 lb (88 kg)  06/10/21 194 lb (88 kg)  04/29/21 196 lb (88.9 kg)    General appearance: alert, cooperative  and appears stated age Ears: normal TM's and external ear canals both ears Throat: lips, mucosa, and tongue normal; teeth and gums normal Neck: no adenopathy, no carotid bruit, supple, symmetrical, trachea midline and thyroid not enlarged, symmetric, no tenderness/mass/nodules Back: symmetric, no curvature. ROM normal. No CVA tenderness. Lungs: clear to auscultation bilaterally Heart: regular rate and rhythm, S1, S2 normal, no murmur, click, rub or gallop Abdomen: soft, non-tender; bowel sounds normal; no masses,  no organomegaly Pulses: 2+ and symmetric Skin: Skin color, texture, turgor normal. No rashes or lesions Lymph nodes: Cervical, supraclavicular, and axillary nodes normal. Neuro:  awake and interactive with normal mood and affect. Higher cortical functions are normal. Speech is clear without word-finding difficulty or dysarthria. Extraocular movements are intact. Visual fields of both eyes are grossly intact. Sensation to light touch is grossly intact bilaterally of upper and lower extremities. Motor examination shows 4+/5 symmetric hand grip and upper extremity and 5/5 lower extremity strength. There is no pronation or drift. Gait is non-ataxic   Lab Results  Component Value Date   HGBA1C 8.0 (H) 07/29/2021   HGBA1C 7.3 (H) 04/17/2021   HGBA1C 7.7 (H) 01/14/2021    Lab Results  Component Value Date   CREATININE 0.87 07/29/2021   CREATININE 1.02 04/17/2021   CREATININE 1.03 01/14/2021    Lab Results  Component Value Date   WBC 7.5 12/09/2020   HGB 13.7 12/09/2020   HCT 41.2 12/09/2020   PLT 278.0 12/09/2020   GLUCOSE 192 (H) 07/29/2021   CHOL 125 07/29/2021   TRIG 105.0 07/29/2021   HDL 41.50 07/29/2021   LDLDIRECT 69.0 07/29/2021   LDLCALC 63 07/29/2021   ALT 15 07/29/2021   AST 15 07/29/2021   NA 135 07/29/2021   K 4.3 07/29/2021   CL 102 07/29/2021   CREATININE 0.87 07/29/2021   BUN 17 07/29/2021   CO2 22 07/29/2021   TSH 0.56 12/09/2020   PSA 1.61  08/25/2017   HGBA1C 8.0 (H) 07/29/2021   MICROALBUR 2.3 (H) 01/14/2021    DG Chest 2 View  Result Date: 12/10/2020 CLINICAL DATA:  New atrial fibrillation.  Rule out CHF. EXAM: CHEST - 2 VIEW COMPARISON:  None. FINDINGS: Mild diffuse chronic interstitial coarsening. No focal consolidation, pleural effusion or pneumothorax. The cardiac silhouette is within normal limits. Degenerative changes of the spine. No acute osseous pathology. IMPRESSION: No active cardiopulmonary disease. Electronically Signed   By: Anner Crete M.D.   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  Instructed on use of glucometer to check bs once daily ar various times . Continue asa,  ARB and statin  Return for a1c in > 24 hours       Relevant Medications   dapagliflozin propanediol (FARXIGA) 5 MG TABS tablet   Other Relevant Orders   Hemoglobin A1c   Comprehensive metabolic panel   Microalbumin / creatinine urine ratio   RSV,Recombinant PF (Arexvy)   Hyperlipidemia associated with type 2 diabetes mellitus ([place])    Currently well-controlled on current medications .   LDL and triglycerides have been  controlled with rosuvastatin and fenofibrate, and he is taking a baby aspirin daily.  He has no side effects and liver enzymes are normal.   Lab Results  Component Value Date   CHOL 125 07/29/2021   HDL 41.50 07/29/2021   LDLCALC 63 07/29/2021   LDLDIRECT 69.0 07/29/2021   TRIG 105.0 07/29/2021   CHOLHDL 3 07/29/2021   Lab Results  Component Value Date   ALT 15 07/29/2021   AST 15 07/29/2021   ALKPHOS 58 07/29/2021   BILITOT 0.4 07/29/2021         Relevant Medications   dapagliflozin propanediol (FARXIGA) 5 MG TABS tablet   Other Relevant Orders   Lipid panel   LDL cholesterol,  direct   Essential hypertension    Well controlled on current regimen of amlodipine, carvedilol, and telmisartan .  Renal function stable, no changes today.      Relevant Orders   Hemoglobin A1c   Microalbumin / creatinine urine ratio   OSA (obstructive sleep apnea)    Diagnosed by sleep study. he is wearing her CPAP every night a minimum of 6 hours per night and notes improved daytime wakefulness and decreased fatigue       Low back pain    S/p left L4/L5 transforaminal epidural injection by [facility]  Sept 22.  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  Advised to - Continue to use cane for ambulation  and  Continue to use AFOs        Other Visit Diagnoses     Colon cancer screening    -  Primary   Relevant Orders   Ambulatory referral to General Surgery   Need for immunization against influenza       Relevant Orders   Flu Vaccine QUAD High Dose(Fluad) (Completed)       I spent a total of  40  minutes with this patient in a face to face visit on the date of this encounter reviewing the last office visit with me , most recent visit with cardiology, neurology, and orthopedics, patient's diet and exercise habits, home blood pressure r readings,  and post visit ordering of testing and therapeutics.    Follow-up: Return in about 3 months (around 01/28/2022) for follow up diabetes.   Crecencio Mc, MD

## 2021-10-28 NOTE — Assessment & Plan Note (Signed)
Well controlled on current regimen of amlodipine, carvedilol, and telmisartan .  Renal function stable, no changes today.

## 2021-10-30 ENCOUNTER — Other Ambulatory Visit (INDEPENDENT_AMBULATORY_CARE_PROVIDER_SITE_OTHER): Payer: Medicare PPO

## 2021-10-30 DIAGNOSIS — I10 Essential (primary) hypertension: Secondary | ICD-10-CM | POA: Diagnosis not present

## 2021-10-30 DIAGNOSIS — E1169 Type 2 diabetes mellitus with other specified complication: Secondary | ICD-10-CM

## 2021-10-30 DIAGNOSIS — E785 Hyperlipidemia, unspecified: Secondary | ICD-10-CM | POA: Diagnosis not present

## 2021-10-30 DIAGNOSIS — E1142 Type 2 diabetes mellitus with diabetic polyneuropathy: Secondary | ICD-10-CM | POA: Diagnosis not present

## 2021-10-30 LAB — MICROALBUMIN / CREATININE URINE RATIO
Creatinine,U: 109.8 mg/dL
Microalb Creat Ratio: 0.9 mg/g (ref 0.0–30.0)
Microalb, Ur: 1 mg/dL (ref 0.0–1.9)

## 2021-10-30 LAB — COMPREHENSIVE METABOLIC PANEL
ALT: 19 U/L (ref 0–53)
AST: 19 U/L (ref 0–37)
Albumin: 4 g/dL (ref 3.5–5.2)
Alkaline Phosphatase: 43 U/L (ref 39–117)
BUN: 15 mg/dL (ref 6–23)
CO2: 21 mEq/L (ref 19–32)
Calcium: 9.2 mg/dL (ref 8.4–10.5)
Chloride: 105 mEq/L (ref 96–112)
Creatinine, Ser: 1 mg/dL (ref 0.40–1.50)
GFR: 71.58 mL/min (ref >60.00)
Glucose, Bld: 135 mg/dL — ABNORMAL HIGH (ref 70–99)
Potassium: 3.9 mEq/L (ref 3.5–5.1)
Sodium: 137 mEq/L (ref 135–145)
Total Bilirubin: 0.4 mg/dL (ref 0.2–1.2)
Total Protein: 6.6 g/dL (ref 6.0–8.3)

## 2021-10-30 LAB — LIPID PANEL
Cholesterol: 128 mg/dL (ref 0–200)
HDL: 35.9 mg/dL — ABNORMAL LOW (ref >39.00)
LDL Cholesterol: 60 mg/dL (ref 0–99)
NonHDL: 91.85
Total CHOL/HDL Ratio: 4
Triglycerides: 159 mg/dL — ABNORMAL HIGH (ref 0.0–149.0)
VLDL: 31.8 mg/dL (ref 0.0–40.0)

## 2021-10-30 LAB — LDL CHOLESTEROL, DIRECT: Direct LDL: 70 mg/dL

## 2021-10-30 LAB — HEMOGLOBIN A1C: Hgb A1c MFr Bld: 7.3 % — ABNORMAL HIGH (ref 4.6–6.5)

## 2021-10-31 NOTE — Addendum Note (Signed)
Addended by: Crecencio Mc on: 10/31/2021 01:43 PM   Modules accepted: Orders

## 2021-12-02 ENCOUNTER — Other Ambulatory Visit: Payer: Self-pay | Admitting: Internal Medicine

## 2021-12-08 DIAGNOSIS — M21371 Foot drop, right foot: Secondary | ICD-10-CM | POA: Diagnosis not present

## 2021-12-08 DIAGNOSIS — M5116 Intervertebral disc disorders with radiculopathy, lumbar region: Secondary | ICD-10-CM | POA: Diagnosis not present

## 2021-12-08 DIAGNOSIS — M21372 Foot drop, left foot: Secondary | ICD-10-CM | POA: Diagnosis not present

## 2021-12-22 ENCOUNTER — Other Ambulatory Visit: Payer: Self-pay | Admitting: Internal Medicine

## 2022-01-05 ENCOUNTER — Other Ambulatory Visit: Payer: Self-pay | Admitting: Internal Medicine

## 2022-01-21 ENCOUNTER — Other Ambulatory Visit: Payer: Self-pay | Admitting: Internal Medicine

## 2022-01-28 ENCOUNTER — Ambulatory Visit: Payer: Medicare PPO | Admitting: Internal Medicine

## 2022-03-02 ENCOUNTER — Telehealth: Payer: Self-pay

## 2022-03-02 NOTE — Telephone Encounter (Signed)
Requested pt's most recent DM eye exam.

## 2022-03-05 ENCOUNTER — Other Ambulatory Visit (INDEPENDENT_AMBULATORY_CARE_PROVIDER_SITE_OTHER): Payer: Medicare PPO

## 2022-03-05 DIAGNOSIS — E1142 Type 2 diabetes mellitus with diabetic polyneuropathy: Secondary | ICD-10-CM | POA: Diagnosis not present

## 2022-03-05 LAB — COMPREHENSIVE METABOLIC PANEL
ALT: 19 U/L (ref 0–53)
AST: 18 U/L (ref 0–37)
Albumin: 4.1 g/dL (ref 3.5–5.2)
Alkaline Phosphatase: 48 U/L (ref 39–117)
BUN: 16 mg/dL (ref 6–23)
CO2: 27 mEq/L (ref 19–32)
Calcium: 9.3 mg/dL (ref 8.4–10.5)
Chloride: 105 mEq/L (ref 96–112)
Creatinine, Ser: 0.92 mg/dL (ref 0.40–1.50)
GFR: 78.93 mL/min (ref >60.00)
Glucose, Bld: 150 mg/dL — ABNORMAL HIGH (ref 70–99)
Potassium: 4 mEq/L (ref 3.5–5.1)
Sodium: 138 mEq/L (ref 135–145)
Total Bilirubin: 0.4 mg/dL (ref 0.2–1.2)
Total Protein: 6.6 g/dL (ref 6.0–8.3)

## 2022-03-05 LAB — HEMOGLOBIN A1C: Hgb A1c MFr Bld: 6.7 % — ABNORMAL HIGH (ref 4.6–6.5)

## 2022-03-08 ENCOUNTER — Ambulatory Visit: Payer: Medicare PPO | Admitting: Internal Medicine

## 2022-03-08 ENCOUNTER — Encounter: Payer: Self-pay | Admitting: Internal Medicine

## 2022-03-08 VITALS — BP 128/74 | HR 87 | Temp 97.5°F | Ht 70.0 in | Wt 196.6 lb

## 2022-03-08 DIAGNOSIS — M21372 Foot drop, left foot: Secondary | ICD-10-CM

## 2022-03-08 DIAGNOSIS — M5387 Other specified dorsopathies, lumbosacral region: Secondary | ICD-10-CM

## 2022-03-08 DIAGNOSIS — E1142 Type 2 diabetes mellitus with diabetic polyneuropathy: Secondary | ICD-10-CM | POA: Diagnosis not present

## 2022-03-08 DIAGNOSIS — I471 Supraventricular tachycardia, unspecified: Secondary | ICD-10-CM | POA: Diagnosis not present

## 2022-03-08 DIAGNOSIS — G4733 Obstructive sleep apnea (adult) (pediatric): Secondary | ICD-10-CM | POA: Diagnosis not present

## 2022-03-08 DIAGNOSIS — M21371 Foot drop, right foot: Secondary | ICD-10-CM

## 2022-03-08 DIAGNOSIS — I480 Paroxysmal atrial fibrillation: Secondary | ICD-10-CM

## 2022-03-08 DIAGNOSIS — I429 Cardiomyopathy, unspecified: Secondary | ICD-10-CM

## 2022-03-08 DIAGNOSIS — E538 Deficiency of other specified B group vitamins: Secondary | ICD-10-CM

## 2022-03-08 DIAGNOSIS — E1169 Type 2 diabetes mellitus with other specified complication: Secondary | ICD-10-CM | POA: Diagnosis not present

## 2022-03-08 DIAGNOSIS — I10 Essential (primary) hypertension: Secondary | ICD-10-CM

## 2022-03-08 DIAGNOSIS — E785 Hyperlipidemia, unspecified: Secondary | ICD-10-CM

## 2022-03-08 MED ORDER — TELMISARTAN 40 MG PO TABS: 40.0000 mg | ORAL_TABLET | Freq: Every day | ORAL | 1 refills | Status: DC

## 2022-03-08 MED ORDER — AMLODIPINE BESYLATE 10 MG PO TABS: 10.0000 mg | ORAL_TABLET | Freq: Every day | ORAL | 1 refills | Status: DC

## 2022-03-08 MED ORDER — CYANOCOBALAMIN 1000 MCG/ML IJ SOLN: INTRAMUSCULAR | 1 refills | Status: DC

## 2022-03-08 NOTE — Assessment & Plan Note (Addendum)
Diagnosed by remote leep study. he  has stopped wearing his CPAP due to discomofrt and insomnia

## 2022-03-08 NOTE — Assessment & Plan Note (Signed)
.  htnWell controlled on current regimen of amlodipine, carvedilol, and telmisartan .  Renal function stable, no changes today.

## 2022-03-08 NOTE — Progress Notes (Signed)
Subjective:  Patient ID: [name] [name], male    DOB: November 01, 1940  Age: 82 y.o. MRN: [id number]  CC: The primary encounter diagnosis was Essential hypertension. Diagnoses of Hyperlipidemia associated with type 2 diabetes mellitus ([place]), Type 2 DM with diabetic neuropathy affecting both sides of body ([place]), Sciatica associated with disorder of lumbosacral spine, OSA (obstructive sleep apnea), B12 deficiency, Cardiomyopathy, unspecified type ([place]), Foot drop, bilateral, and PSVT (paroxysmal supraventricular tachycardia) were also pertinent to this visit.   HPI [name] [name] presents for  Chief Complaint  Patient presents with  . Medical Management of Chronic Issues    4 month follow up on diabetes   1) Type  DM:  taking glipizide 5 mg bid, metformin 1000 mg bid and Farxiga 5 mg which was added in October FOR A1c > 7..  tolerating medication without side effects .  Feeling good.   Using cane and foot braces for foot drop,  no falls.    2) HTN: taking telmisartan, carvedilol and amlodipine . Home readings have been < 130/80  3) B12 deficiency:  self injecting  monthly  4) Back pain :  receiving ESI periodically for relief of pain .  Has one scheduled for next week .  No radiculopathy.     Outpatient Medications Prior to Visit  Medication Sig Dispense Refill  . aspirin 81 MG tablet Take 81 mg by mouth daily.    . B-D 3CC LUER-LOK SYR 25GX1" 25G X 1" 3 ML MISC USE AS DIRECTED FOR B12 INJECTIONS 6 each 0  . blood glucose meter kit and supplies KIT Use once daily to check blood sugars (90 day supply) 1 each 0  . carvedilol (COREG) 3.125 MG tablet TAKE ONE TABLET BY MOUTH TWICE DAILY WITH A MEAL 180 tablet 1  . Cholecalciferol (D3-1000) 25 MCG (1000 UT) capsule Take 1,000 Units by mouth daily.    . Choline Fenofibrate (FENOFIBRIC ACID) 135 MG CPDR TAKE 1 CAPSULE BY MOUTH EVERY DAY. 90 capsule 1  . co-enzyme Q-10 30 MG capsule Take 30 mg by mouth daily.    . dapagliflozin propanediol (FARXIGA)  5 MG TABS tablet Take 1 tablet (5 mg total) by mouth daily before breakfast. 90 tablet 2  . fish oil-omega-3 fatty acids 1000 MG capsule Take 2 g by mouth daily.    Marland Kitchen glipiZIDE (GLUCOTROL) 5 MG tablet TAKE ONE TABLET BY MOUTH TWICE DAILY BEFORE A MEAL 180 tablet 1  . metFORMIN (GLUCOPHAGE) 1000 MG tablet TAKE ONE TABLET TWICE A DAY WITH MEALS 60 tablet 2  . Multiple Vitamin (MULTIVITAMIN) tablet Take 1 tablet by mouth daily.    Marland Kitchen NEEDLE, DISP, 25 G [id number] X 1" MISC 1 application by Does not apply route every 30 (thirty) days. 6 each 0  . rosuvastatin (CRESTOR) 40 MG tablet TAKE ONE TABLET EACH EVENING 90 tablet 3  . triamcinolone cream (KENALOG) 0.1 % Apply topically 2 (two) times daily.    Marland Kitchen amLODipine (NORVASC) 10 MG tablet TAKE 1 TABLET BY MOUTH DAILY 90 tablet 1  . cyanocobalamin (,VITAMIN B-12,) 1000 MCG/ML injection INJECT 1 ML INTO THE MUSCLE EVERY 30 DAYS AS DIRECTED 10 mL 1  . telmisartan (MICARDIS) 40 MG tablet TAKE 1 TABLET BY MOUTH AT BEDTIME 90 tablet 1   No facility-administered medications prior to visit.    Review of Systems;  Patient denies headache, fevers, malaise, unintentional weight loss, skin rash, eye pain, sinus congestion and sinus pain, sore throat, dysphagia,  hemoptysis , cough,  dyspnea, wheezing, chest pain, palpitations, orthopnea, edema, abdominal pain, nausea, melena, diarrhea, constipation, flank pain, dysuria, hematuria, urinary  Frequency, nocturia, numbness, tingling, seizures,  Focal weakness, Loss of consciousness,  Tremor, insomnia, depression, anxiety, and suicidal ideation.      Objective:  BP 128/74   Pulse 87   Temp (!) 97.5 F (36.4 C) (Oral)   Ht 5' 10"$  (1.778 m)   Wt 196 lb 9.6 oz (89.2 kg)   SpO2 98%   BMI 28.21 kg/m   BP Readings from Last 3 Encounters:  03/08/22 128/74  06/10/21 130/80  04/29/21 122/74    Wt Readings from Last 3 Encounters:  03/08/22 196 lb 9.6 oz (89.2 kg)  09/14/21 194 lb (88 kg)  06/10/21 194 lb (88 kg)     Physical Exam Vitals reviewed.  Constitutional:      General: He is not in acute distress.    Appearance: Normal appearance. He is normal weight. He is not ill-appearing, toxic-appearing or diaphoretic.  HENT:     Head: Normocephalic.  Eyes:     General: No scleral icterus.       Right eye: No discharge.        Left eye: No discharge.     Conjunctiva/sclera: Conjunctivae normal.  Cardiovascular:     Rate and Rhythm: Normal rate and regular rhythm.     Heart sounds: Normal heart sounds.  Pulmonary:     Effort: Pulmonary effort is normal. No respiratory distress.     Breath sounds: Normal breath sounds.  Musculoskeletal:        General: Normal range of motion.     Cervical back: Normal range of motion.  Skin:    General: Skin is warm and dry.  Neurological:     General: No focal deficit present.     Mental Status: He is alert and oriented to person, place, and time. Mental status is at baseline.     Sensory: Sensory deficit present.  Psychiatric:        Mood and Affect: Mood normal.        Behavior: Behavior normal.        Thought Content: Thought content normal.        Judgment: Judgment normal.   Lab Results  Component Value Date   HGBA1C 6.7 (H) 03/05/2022   HGBA1C 7.3 (H) 10/30/2021   HGBA1C 8.0 (H) 07/29/2021    Lab Results  Component Value Date   CREATININE 0.92 03/05/2022   CREATININE 1.00 10/30/2021   CREATININE 0.87 07/29/2021    Lab Results  Component Value Date   WBC 7.5 12/09/2020   HGB 13.7 12/09/2020   HCT 41.2 12/09/2020   PLT 278.0 12/09/2020   GLUCOSE 150 (H) 03/05/2022   CHOL 128 10/30/2021   TRIG 159.0 (H) 10/30/2021   HDL 35.90 (L) 10/30/2021   LDLDIRECT 70.0 10/30/2021   LDLCALC 60 10/30/2021   ALT 19 03/05/2022   AST 18 03/05/2022   NA 138 03/05/2022   K 4.0 03/05/2022   CL 105 03/05/2022   CREATININE 0.92 03/05/2022   BUN 16 03/05/2022   CO2 27 03/05/2022   TSH 0.56 12/09/2020   PSA 1.61 08/25/2017   HGBA1C 6.7 (H)  03/05/2022   MICROALBUR 1.0 10/30/2021    DG Chest 2 View  Result Date: 12/10/2020 CLINICAL DATA:  New atrial fibrillation.  Rule out CHF. EXAM: CHEST - 2 VIEW COMPARISON:  None. FINDINGS: Mild diffuse chronic interstitial coarsening. No focal consolidation, pleural effusion or pneumothorax. The  cardiac silhouette is within normal limits. Degenerative changes of the spine. No acute osseous pathology. IMPRESSION: No active cardiopulmonary disease. Electronically Signed   By: Anner Crete M.D.   On: 12/10/2020 01:23    Assessment & Plan:  .Essential hypertension Assessment & Plan: .htnWell controlled on current regimen of amlodipine, carvedilol, and telmisartan .  Renal function stable, no changes today.  Orders: -     Comprehensive metabolic panel; Future  Hyperlipidemia associated with type 2 diabetes mellitus ([place]) -     Lipid panel; Future -     LDL cholesterol, direct; Future  Type 2 DM with diabetic neuropathy affecting both sides of body [facility]) Assessment & Plan: Loss of control by last a1c,  improved with addition of  Farxiga 5 mg to current regimen of metformin and glipizide.  No changes today   Orders: -     Hemoglobin A1c; Future -     Comprehensive metabolic panel; Future  Sciatica associated with disorder of lumbosacral spine Assessment & Plan: state post left l4/L5 Tf ESI on 09/08/21  with good relief of pain .  [facility]ing [facility] another [facility]SI    OSA (obstructive sleep apnea) Assessment & Plan: Diagnosed by remote leep study. he  has stopped wearing his CPAP due to discomofrt and insomnia    B12 deficiency Assessment & Plan: Presumed due to metformin  continue monthly injections.   Orders: -     Vitamin B12; Future -     Intrinsic Factor Antibodies; Future  Cardiomyopathy, unspecified type ([place]) Assessment & Plan: Continue BP control. He remains asymptomatic   Foot drop, bilateral Assessment & Plan: seconday to Severe generalized  sensorimotor polyneuropathy affecting hands and feet - Stable .  Advised to - Continue to use cane for ambulation  and  Continue to use AFOs     PSVT (paroxysmal supraventricular tachycardia) Assessment & Plan: Noted on 14 day Zio monitor.  Has not had any runs since using  carvedilol.   Other orders -     amLODIPine Besylate; Take 1 tablet (10 mg total) by mouth daily.  Dispense: 90 tablet; Refill: 1 -     Cyanocobalamin; INJECT 1 ML INTO THE MUSCLE EVERY 30 DAYS AS DIRECTED  Dispense: 10 mL; Refill: 1 -     Telmisartan; Take 1 tablet (40 mg total) by mouth at bedtime.  Dispense: 90 tablet; Refill: 1     I provided 30 minutes of face-to-face time during this encounter reviewing patient's last visit with me, patient's  most recent visit with cardiology,  nephrology,  and neurology,  recent surgical and non surgical procedures, previous  labs and imaging studies, counseling on currently addressed issues,  and post visit ordering to diagnostics and therapeutics .   Follow-up: Return in about 4 months (around 07/07/2022) for follow up diabetes.   Crecencio Mc, MD

## 2022-03-08 NOTE — Assessment & Plan Note (Signed)
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  Advised to - Continue to use cane for ambulation  and  Continue to use AFOs

## 2022-03-08 NOTE — Assessment & Plan Note (Addendum)
Loss of control by last a1c,  now improved with addition of  Farxiga 5 mg to current regimen of metformin and glipizide.  No changes today   Lab Results  Component Value Date   HGBA1C 6.7 (H) 03/05/2022   Lab Results  Component Value Date   MICROALBUR 1.0 10/30/2021   MICROALBUR 2.3 (H) 01/14/2021

## 2022-03-08 NOTE — Assessment & Plan Note (Signed)
Continue BP control. He remains asymptomatic

## 2022-03-08 NOTE — Assessment & Plan Note (Addendum)
Currently well-controlled on current medications .   LDL and triglycerides have been  controlled with rosuvastatin and fenofibrate, and he is taking a baby aspirin daily.  He has no side effects and liver enzymes are normal.   Lab Results  Component Value Date   CHOL 128 10/30/2021   HDL 35.90 (L) 10/30/2021   LDLCALC 60 10/30/2021   LDLDIRECT 70.0 10/30/2021   TRIG 159.0 (H) 10/30/2021   CHOLHDL 4 10/30/2021   Lab Results  Component Value Date   ALT 19 03/05/2022   AST 18 03/05/2022   ALKPHOS 48 03/05/2022   BILITOT 0.4 03/05/2022

## 2022-03-08 NOTE — Assessment & Plan Note (Addendum)
state post left l4/L5 Tf ESI on 09/08/21  with good relief of pain .  Returning next week to [facility] for another [facility]

## 2022-03-08 NOTE — Assessment & Plan Note (Signed)
Presumed due to metformin  continue monthly injections.

## 2022-03-08 NOTE — Assessment & Plan Note (Signed)
Noted on 14 day Zio monitor.  Has not had any runs since using  carvedilol.

## 2022-03-15 DIAGNOSIS — M21371 Foot drop, right foot: Secondary | ICD-10-CM | POA: Diagnosis not present

## 2022-03-15 DIAGNOSIS — M21372 Foot drop, left foot: Secondary | ICD-10-CM | POA: Diagnosis not present

## 2022-03-15 DIAGNOSIS — M4807 Spinal stenosis, lumbosacral region: Secondary | ICD-10-CM | POA: Diagnosis not present

## 2022-04-01 DIAGNOSIS — M4807 Spinal stenosis, lumbosacral region: Secondary | ICD-10-CM | POA: Diagnosis not present

## 2022-04-02 ENCOUNTER — Other Ambulatory Visit: Payer: Self-pay | Admitting: Internal Medicine

## 2022-04-02 MED ORDER — DAPAGLIFLOZIN PROPANEDIOL 5 MG PO TABS: 5.0000 mg | ORAL_TABLET | Freq: Every day | ORAL | 2 refills | Status: DC

## 2022-04-16 ENCOUNTER — Other Ambulatory Visit (HOSPITAL_COMMUNITY): Payer: Self-pay

## 2022-04-29 ENCOUNTER — Other Ambulatory Visit: Payer: Self-pay | Admitting: Internal Medicine

## 2022-07-02 ENCOUNTER — Other Ambulatory Visit (INDEPENDENT_AMBULATORY_CARE_PROVIDER_SITE_OTHER): Payer: Medicare PPO

## 2022-07-02 DIAGNOSIS — I10 Essential (primary) hypertension: Secondary | ICD-10-CM | POA: Diagnosis not present

## 2022-07-02 DIAGNOSIS — E785 Hyperlipidemia, unspecified: Secondary | ICD-10-CM

## 2022-07-02 DIAGNOSIS — E538 Deficiency of other specified B group vitamins: Secondary | ICD-10-CM | POA: Diagnosis not present

## 2022-07-02 DIAGNOSIS — E1142 Type 2 diabetes mellitus with diabetic polyneuropathy: Secondary | ICD-10-CM

## 2022-07-02 DIAGNOSIS — E1169 Type 2 diabetes mellitus with other specified complication: Secondary | ICD-10-CM

## 2022-07-02 LAB — LIPID PANEL
Cholesterol: 129 mg/dL (ref 0–200)
HDL: 34.1 mg/dL — ABNORMAL LOW (ref >39.00)
LDL Cholesterol: 65 mg/dL (ref 0–99)
NonHDL: 94.45
Total CHOL/HDL Ratio: 4
Triglycerides: 147 mg/dL (ref 0.0–149.0)
VLDL: 29.4 mg/dL (ref 0.0–40.0)

## 2022-07-02 LAB — COMPREHENSIVE METABOLIC PANEL WITH GFR
ALT: 17 U/L (ref 0–53)
AST: 17 U/L (ref 0–37)
Albumin: 4 g/dL (ref 3.5–5.2)
Alkaline Phosphatase: 40 U/L (ref 39–117)
BUN: 15 mg/dL (ref 6–23)
CO2: 27 meq/L (ref 19–32)
Calcium: 9.5 mg/dL (ref 8.4–10.5)
Chloride: 105 meq/L (ref 96–112)
Creatinine, Ser: 0.95 mg/dL (ref 0.40–1.50)
GFR: 75.77 mL/min (ref >60.00)
Glucose, Bld: 129 mg/dL — ABNORMAL HIGH (ref 70–99)
Potassium: 4.9 meq/L (ref 3.5–5.1)
Sodium: 140 meq/L (ref 135–145)
Total Bilirubin: 0.5 mg/dL (ref 0.2–1.2)
Total Protein: 6.6 g/dL (ref 6.0–8.3)

## 2022-07-02 LAB — VITAMIN B12: Vitamin B-12: 421 pg/mL (ref 211–911)

## 2022-07-02 LAB — HEMOGLOBIN A1C: Hgb A1c MFr Bld: 6 % (ref 4.6–6.5)

## 2022-07-02 LAB — LDL CHOLESTEROL, DIRECT: Direct LDL: 75 mg/dL

## 2022-07-06 LAB — INTRINSIC FACTOR ANTIBODIES: Intrinsic Factor: NEGATIVE

## 2022-07-07 ENCOUNTER — Ambulatory Visit: Payer: Medicare PPO | Admitting: Internal Medicine

## 2022-07-07 ENCOUNTER — Other Ambulatory Visit: Payer: Self-pay | Admitting: Internal Medicine

## 2022-07-26 ENCOUNTER — Other Ambulatory Visit: Payer: Self-pay | Admitting: Internal Medicine

## 2022-08-16 ENCOUNTER — Encounter: Payer: Self-pay | Admitting: Internal Medicine

## 2022-08-16 ENCOUNTER — Telehealth (INDEPENDENT_AMBULATORY_CARE_PROVIDER_SITE_OTHER): Payer: Medicare PPO | Admitting: Internal Medicine

## 2022-08-16 VITALS — Ht 70.0 in | Wt 196.0 lb

## 2022-08-16 DIAGNOSIS — E538 Deficiency of other specified B group vitamins: Secondary | ICD-10-CM | POA: Diagnosis not present

## 2022-08-16 DIAGNOSIS — I10 Essential (primary) hypertension: Secondary | ICD-10-CM

## 2022-08-16 DIAGNOSIS — M5116 Intervertebral disc disorders with radiculopathy, lumbar region: Secondary | ICD-10-CM | POA: Diagnosis not present

## 2022-08-16 DIAGNOSIS — M5387 Other specified dorsopathies, lumbosacral region: Secondary | ICD-10-CM

## 2022-08-16 DIAGNOSIS — E785 Hyperlipidemia, unspecified: Secondary | ICD-10-CM

## 2022-08-16 DIAGNOSIS — Z7984 Long term (current) use of oral hypoglycemic drugs: Secondary | ICD-10-CM | POA: Diagnosis not present

## 2022-08-16 DIAGNOSIS — E1142 Type 2 diabetes mellitus with diabetic polyneuropathy: Secondary | ICD-10-CM

## 2022-08-16 DIAGNOSIS — E1169 Type 2 diabetes mellitus with other specified complication: Secondary | ICD-10-CM | POA: Diagnosis not present

## 2022-08-16 NOTE — Assessment & Plan Note (Signed)
IF antibody negative.  He prefers to continue home monthly injections

## 2022-08-16 NOTE — Assessment & Plan Note (Signed)
LDL and triglycerides are at goal on  rosuvastatin and fenofibrate, and he is taking a baby aspirin daily.  He has no side effects and liver enzymes are normal.   Lab Results  Component Value Date   CHOL 129 07/02/2022   HDL 34.10 (L) 07/02/2022   LDLCALC 65 07/02/2022   LDLDIRECT 75.0 07/02/2022   TRIG 147.0 07/02/2022   CHOLHDL 4 07/02/2022   Lab Results  Component Value Date   ALT 17 07/02/2022   AST 17 07/02/2022   ALKPHOS 40 07/02/2022   BILITOT 0.5 07/02/2022

## 2022-08-16 NOTE — Assessment & Plan Note (Signed)
He is pain free following his last ESI in March

## 2022-08-16 NOTE — Progress Notes (Signed)
Telephone Visit Note   This format is felt to be most appropriate for this patient at this time.  All issues noted in this document were discussed and addressed.  No physical exam was performed (except for noted visual exam findings with Video Visits).   I attempted to connect  with Mr [name] [name]  on 08/16/22 at  1:00 PM EDT by a video enabled telemedicine application  and verified that I am speaking with the correct person using two identifiers. Location patient: home Location provider: work or home office Persons participating in the virtual visit: patient, provider  I discussed the limitations, risks, security and privacy concerns of performing an evaluation and management service by telephone and the availability of in person appointments. I also discussed with the patient that there may be a patient responsible charge related to this service. The patient expressed understanding and agreed to proceed.  Interactive audio and video telecommunications were attempted between this provider and patient, however failed, due to patient having technical difficulties .  We continued and completed visit with audio only.   Reason for visit:   HPI:  82 yr old male with type 2 DM with neuropathy,  chronic low back pain presents for follow up  1) type 2 DM: notchcking   ROS: See pertinent positives and negatives per HPI.  Past Medical History:  Diagnosis Date   Abnormal echocardiogram    a. 01/2021 Echo: EF 50%, septal DK (bbb/IVCD), infero/posterior HK. GrI DD. Nl RV fxn. Ao root 37mm. Asc Ao 36mm.   Cataracts, bilateral    Diabetes mellitus    Type 2   Hyperlipidemia    Hypertension    Peripheral neuropathy 01/19/2007   positive EMG studies, negative workup for causes   Positive PPD, treated 01/19/1968   Premature atrial contractions    a. 12/2020 Zio: predominantly RSR, 67 (47-107). Freq PACs (7-8% burden). Rare PVCs. 149 atrial runs (longest 11.5 secs, max rate 193). No sustained  arrhythmias/pauses; no afib/flutter.    Past Surgical History:  Procedure Laterality Date   CATARACT EXTRACTION W/PHACO Right 11/24/2020   Procedure: CATARACT EXTRACTION PHACO AND INTRAOCULAR LENS PLACEMENT (IOC) RIGHT DIABETIC 4.17 00:33.8;  Surgeon: Nevada Crane, MD;  Location[facility];  Service: Ophthalmology;  Laterality: Right;   CATARACT EXTRACTION W/PHACO Left 12/08/2020   Procedure: CATARACT EXTRACTION PHACO AND INTRAOCULAR LENS PLACEMENT (IOC) LEFT DIABETIC;  Surgeon: Nevada Crane, MD;  Location: [facility];  Service: Ophthalmology;  Laterality: Left;  8.81 00:44.4   COLONOSCOPY  2004, 2014   Dr Lemar Livings   COLONOSCOPY WITH PROPOFOL N/A 04/30/2015   Procedure: COLONOSCOPY WITH PROPOFOL;  Surgeon: Earline Mayotte, MD;  Location: [facility];  Service: Endoscopy;  Laterality: N/A;   ROTATOR CUFF REPAIR  2005   right (Dr. Mack Guise)   TONSILLECTOMY     WRIST FRACTURE SURGERY Left    Armour, Ted     Family History  Problem Relation Age of Onset   Heart attack Father 79   Dementia Sister    Kidney disease Sister    Cancer Neg Hx     SOCIAL HX:  reports that he quit smoking about 42 years ago. His smoking use included cigarettes. He started smoking about 72 years ago. He has a 60 pack-year smoking history. He quit smokeless tobacco use about 31 years ago.  His smokeless tobacco use included chew. He reports that he does not currently use alcohol. He reports that he does not use drugs.  Current Outpatient Medications:    amLODipine (NORVASC) 10 MG tablet, Take 1 tablet (10 mg total) by mouth daily., Disp: 90 tablet, Rfl: 1   aspirin 81 MG tablet, Take 81 mg by mouth daily., Disp: , Rfl:    B-D 3CC LUER-LOK SYR 25GX1" 25G X 1" 3 ML MISC, USE AS DIRECTED FOR B12 INJECTIONS, Disp: 6 each, Rfl: 0   blood glucose meter kit and supplies KIT, Use once daily to check blood sugars (90 day supply), Disp: 1 each, Rfl: 0   carvedilol (COREG) 3.125 MG  tablet, TAKE ONE TABLET BY MOUTH TWICE DAILY WITH A MEAL, Disp: 180 tablet, Rfl: 1   Cholecalciferol (D3-1000) 25 MCG (1000 UT) capsule, Take 1,000 Units by mouth daily., Disp: , Rfl:    Choline Fenofibrate (FENOFIBRIC ACID) 135 MG CPDR, TAKE 1 CAPSULE BY MOUTH EVERY DAY., Disp: 90 capsule, Rfl: 1   co-enzyme Q-10 30 MG capsule, Take 30 mg by mouth daily., Disp: , Rfl:    cyanocobalamin (VITAMIN B12) 1000 MCG/ML injection, INJECT 1 ML INTO THE MUSCLE EVERY 30 DAYS AS DIRECTED, Disp: 10 mL, Rfl: 1   dapagliflozin propanediol (FARXIGA) 5 MG TABS tablet, Take 1 tablet (5 mg total) by mouth daily before breakfast., Disp: 90 tablet, Rfl: 2   fish oil-omega-3 fatty acids 1000 MG capsule, Take 2 g by mouth daily., Disp: , Rfl:    glipiZIDE (GLUCOTROL) 5 MG tablet, TAKE ONE TABLET BY MOUTH TWICE DAILY BEFORE A MEAL, Disp: 180 tablet, Rfl: 1   metFORMIN (GLUCOPHAGE) 1000 MG tablet, TAKE ONE TABLET TWICE A DAY WITH MEALS, Disp: 180 tablet, Rfl: 1   Multiple Vitamin (MULTIVITAMIN) tablet, Take 1 tablet by mouth daily., Disp: , Rfl:    NEEDLE, DISP, 25 G 25G X 1" MISC, 1 application by Does not apply route every 30 (thirty) days., Disp: 6 each, Rfl: 0   rosuvastatin (CRESTOR) 40 MG tablet, TAKE ONE TABLET EACH EVENING, Disp: 90 tablet, Rfl: 3   telmisartan (MICARDIS) 40 MG tablet, Take 1 tablet (40 mg total) by mouth at bedtime., Disp: 90 tablet, Rfl: 1   triamcinolone cream (KENALOG) 0.1 %, Apply topically 2 (two) times daily., Disp: , Rfl:   EXAM:  VITALS per patient if applicable:  General appearance: alert, cooperative and articulate.  No signs of being in distress  Lungs: not short of breath ,  No cough, speaking in full sentences  Psych: affect normal,dspeech is articulate and non pressured .  Denies suicidal thoughts     ASSESSMENT AND PLAN: B12 deficiency Assessment & Plan: IF antibody negative.  He prefers to continue home monthly injections   Essential hypertension Assessment &  Plan: Well controlled on current regimen of amlodipine, carvedilol, and telmisartan .  Renal function stable, no changes today.  Lab Results  Component Value Date   CREATININE 0.95 07/02/2022   Lab Results  Component Value Date   NA 140 07/02/2022   K 4.9 07/02/2022   CL 105 07/02/2022   CO2 27 07/02/2022      Hyperlipidemia associated with type 2 diabetes mellitus ([facility]) Assessment & Plan: LDL and triglycerides are at goal on  rosuvastatin and fenofibrate, and he is taking a baby aspirin daily.  He has no side effects and liver enzymes are normal.   Lab Results  Component Value Date   CHOL 129 07/02/2022   HDL 34.10 (L) 07/02/2022   LDLCALC 65 07/02/2022   LDLDIRECT 75.0 07/02/2022   TRIG 147.0 07/02/2022   CHOLHDL 4  07/02/2022   Lab Results  Component Value Date   ALT 17 07/02/2022   AST 17 07/02/2022   ALKPHOS 40 07/02/2022   BILITOT 0.5 07/02/2022      Intervertebral disc disorder with radiculopathy of lumbar region Assessment & Plan: Managed with periodic ESI by Dr. Verdie Mosher at [facility] > last injection done in march relieved his pain   Sciatica associated with disorder of lumbosacral spine Assessment & Plan: He is pain free following his last ESI in March    Type 2 DM with diabetic neuropathy affecting both sides of body ([facility]) Assessment & Plan: Loss of control by last a1c,  now improved with addition of  Farxiga 5 mg to current regimen of metformin and glipizide.  No changes today   Lab Results  Component Value Date   HGBA1C 6.0 07/02/2022   Lab Results  Component Value Date   MICROALBUR 1.0 10/30/2021   MICROALBUR 2.3 (H) 01/14/2021            I discussed the assessment and treatment plan with the patient. The patient was provided an opportunity to ask questions and all were answered. The patient agreed with the plan and demonstrated an understanding of the instructions.   The patient was advised to call back or seek an in-person  evaluation if the symptoms worsen or if the condition fails to improve as anticipated.   I spent 22  minutes dedicated to the care of this patient on the date of this telephone encounter to include pre-visit review of his medical history,  non  Face-to-face time with the patient , and post visit ordering of testing and therapeutics.    Sherlene Shams, MD

## 2022-08-16 NOTE — Assessment & Plan Note (Signed)
Well controlled on current regimen of amlodipine, carvedilol, and telmisartan .  Renal function stable, no changes today.  Lab Results  Component Value Date   CREATININE 0.95 07/02/2022   Lab Results  Component Value Date   NA 140 07/02/2022   K 4.9 07/02/2022   CL 105 07/02/2022   CO2 27 07/02/2022

## 2022-08-16 NOTE — Assessment & Plan Note (Signed)
Managed with periodic ESI by Dr. Verdie Mosher at [facility] > last injection done in march relieved his pain

## 2022-08-16 NOTE — Assessment & Plan Note (Signed)
Loss of control by last a1c,  now improved with addition of  Farxiga 5 mg to current regimen of metformin and glipizide.  No changes today   Lab Results  Component Value Date   HGBA1C 6.0 07/02/2022   Lab Results  Component Value Date   MICROALBUR 1.0 10/30/2021   MICROALBUR 2.3 (H) 01/14/2021

## 2022-09-14 ENCOUNTER — Other Ambulatory Visit: Payer: Self-pay | Admitting: Internal Medicine

## 2022-09-15 ENCOUNTER — Telehealth: Payer: Self-pay | Admitting: Internal Medicine

## 2022-09-15 NOTE — Telephone Encounter (Signed)
Copied from CRM [id number]. Topic: Medicare AWV >> Sep 15, 2022  8:58 AM Payton Doughty wrote: Reason for CRM: LM 09/15/2022 to schedule AWV   Verlee Rossetti; Care Guide Ambulatory Clinical Support [place] [facility] Direct Dial: [phone]

## 2022-10-20 DIAGNOSIS — Z961 Presence of intraocular lens: Secondary | ICD-10-CM | POA: Diagnosis not present

## 2022-10-20 DIAGNOSIS — E119 Type 2 diabetes mellitus without complications: Secondary | ICD-10-CM | POA: Diagnosis not present

## 2022-10-20 DIAGNOSIS — H26493 Other secondary cataract, bilateral: Secondary | ICD-10-CM | POA: Diagnosis not present

## 2022-10-20 LAB — HM DIABETES EYE EXAM

## 2022-10-25 ENCOUNTER — Telehealth: Payer: Self-pay | Admitting: Internal Medicine

## 2022-10-25 NOTE — Telephone Encounter (Signed)
Copied from CRM [id number]. Topic: Medicare AWV >> Oct 25, 2022 11:46 AM Payton Doughty wrote: Reason for CRM: Called LVM 10/25/2022 to schedule AWV   Verlee Rossetti; Care Guide Ambulatory Clinical Support [place] [facility] Direct Dial: [phone]

## 2022-10-27 ENCOUNTER — Other Ambulatory Visit: Payer: Self-pay | Admitting: Internal Medicine

## 2022-11-05 ENCOUNTER — Other Ambulatory Visit: Payer: Self-pay | Admitting: Internal Medicine

## 2022-11-25 ENCOUNTER — Other Ambulatory Visit: Payer: Self-pay | Admitting: Internal Medicine

## 2022-12-06 ENCOUNTER — Other Ambulatory Visit: Payer: Self-pay | Admitting: Internal Medicine

## 2022-12-08 DIAGNOSIS — H26493 Other secondary cataract, bilateral: Secondary | ICD-10-CM | POA: Diagnosis not present

## 2022-12-15 ENCOUNTER — Other Ambulatory Visit: Payer: Self-pay | Admitting: Internal Medicine

## 2023-01-13 ENCOUNTER — Other Ambulatory Visit: Payer: Self-pay | Admitting: Internal Medicine

## 2023-02-09 ENCOUNTER — Other Ambulatory Visit: Payer: Self-pay | Admitting: Internal Medicine

## 2023-03-03 ENCOUNTER — Ambulatory Visit: Payer: Medicare PPO | Admitting: Internal Medicine

## 2023-03-07 ENCOUNTER — Ambulatory Visit (INDEPENDENT_AMBULATORY_CARE_PROVIDER_SITE_OTHER): Payer: Medicare PPO

## 2023-03-07 ENCOUNTER — Ambulatory Visit: Payer: Medicare PPO | Admitting: Internal Medicine

## 2023-03-07 ENCOUNTER — Encounter: Payer: Self-pay | Admitting: Internal Medicine

## 2023-03-07 VITALS — BP 116/74 | HR 69 | Temp 97.4°F | Ht 70.0 in | Wt 179.8 lb

## 2023-03-07 DIAGNOSIS — E1169 Type 2 diabetes mellitus with other specified complication: Secondary | ICD-10-CM

## 2023-03-07 DIAGNOSIS — I10 Essential (primary) hypertension: Secondary | ICD-10-CM | POA: Diagnosis not present

## 2023-03-07 DIAGNOSIS — E538 Deficiency of other specified B group vitamins: Secondary | ICD-10-CM

## 2023-03-07 DIAGNOSIS — R634 Abnormal weight loss: Secondary | ICD-10-CM | POA: Diagnosis not present

## 2023-03-07 DIAGNOSIS — E1142 Type 2 diabetes mellitus with diabetic polyneuropathy: Secondary | ICD-10-CM

## 2023-03-07 DIAGNOSIS — E785 Hyperlipidemia, unspecified: Secondary | ICD-10-CM | POA: Diagnosis not present

## 2023-03-07 DIAGNOSIS — R413 Other amnesia: Secondary | ICD-10-CM

## 2023-03-07 DIAGNOSIS — Z23 Encounter for immunization: Secondary | ICD-10-CM | POA: Diagnosis not present

## 2023-03-07 DIAGNOSIS — F0390 Unspecified dementia without behavioral disturbance: Secondary | ICD-10-CM | POA: Insufficient documentation

## 2023-03-07 DIAGNOSIS — R4189 Other symptoms and signs involving cognitive functions and awareness: Secondary | ICD-10-CM

## 2023-03-07 DIAGNOSIS — F01A0 Vascular dementia, mild, without behavioral disturbance, psychotic disturbance, mood disturbance, and anxiety: Secondary | ICD-10-CM | POA: Diagnosis not present

## 2023-03-07 MED ORDER — ROSUVASTATIN CALCIUM 40 MG PO TABS: ORAL_TABLET | ORAL | 1 refills | Status: DC

## 2023-03-07 MED ORDER — GLIPIZIDE 5 MG PO TABS: ORAL_TABLET | ORAL | 1 refills | Status: DC

## 2023-03-07 NOTE — Assessment & Plan Note (Addendum)
 Attributed to smaller appetite. Patient  denies abd pain,  early satiety, nausea.  Dementia may be contributing as he was unaware of the 20 lb wt loss.   Checking A1c and thyroid  as well

## 2023-03-07 NOTE — Assessment & Plan Note (Addendum)
 MMSE score today was 19/30 due to lack detail on dates,  concentration.  Clock drawing was normal .  MRI brain rdered  and Neurology referral made to Dr Sherryll Burger

## 2023-03-07 NOTE — Progress Notes (Unsigned)
 Subjective:  Patient ID: [name] [name], male    DOB: [birth year]  Age: 83 y.o. MRN: [id number]  CC: The primary encounter diagnosis was Essential hypertension. Diagnoses of Type 2 DM with diabetic neuropathy affecting both sides of body (HCC), Hyperlipidemia associated with type 2 diabetes mellitus (HCC), Cognitive changes, Unintentional weight loss of 10% body weight within 6 months, B12 deficiency, Short-term memory loss, and Mild vascular dementia without behavioral disturbance, psychotic disturbance, mood disturbance, or anxiety (HCC) were also pertinent to this visit.   HPI [name] [name] presents for  Chief Complaint  Patient presents with   Medical Management of Chronic Issues    Discuss dementia medications    Referred by wife due to cognitive changes.  Patient denies trouble with meds bc he uses a pill box and fills it weekly .   MMSE done today  19/30 .   Has lost 20 lbs since the summer  due to decreased appetite.  Denies abd pain.  "I just don't get as hungry as I used to."   I        Outpatient Medications Prior to Visit  Medication Sig Dispense Refill   amLODipine (NORVASC) 10 MG tablet TAKE ONE TABLET (10 MG) BY MOUTH EVERY DAY 90 tablet 1   aspirin 81 MG tablet Take 81 mg by mouth daily.     B-D 3CC LUER-LOK SYR 25GX1" 25G X 1" 3 ML MISC USE AS DIRECTED FOR B12 INJECTIONS 6 each 0   blood glucose meter kit and supplies KIT Use once daily to check blood sugars (90 day supply) 1 each 0   carvedilol (COREG) 3.125 MG tablet TAKE ONE TABLET BY MOUTH TWICE DAILY WITH A MEAL 180 tablet 1   Cholecalciferol (D3-1000) 25 MCG (1000 UT) capsule Take 1,000 Units by mouth daily.     Choline Fenofibrate (FENOFIBRIC ACID) 135 MG CPDR TAKE 1 CAPSULE BY MOUTH EVERY DAY. 90 capsule 1   co-enzyme Q-10 30 MG capsule Take 30 mg by mouth daily.     cyanocobalamin (VITAMIN B12) 1000 MCG/ML injection INJECT 1 ML INTO THE MUSCLE EVERY 30 DAYS AS DIRECTED 10 mL 1   FARXIGA 5 MG TABS tablet  TAKE 1 TABLET BY MOUTH DAILY BEFORE BREAKFAST 90 tablet 2   fish oil-omega-3 fatty acids 1000 MG capsule Take 2 g by mouth daily.     metFORMIN (GLUCOPHAGE) 1000 MG tablet TAKE ONE TABLET TWICE A DAY WITH MEALS 180 tablet 1   Multiple Vitamin (MULTIVITAMIN) tablet Take 1 tablet by mouth daily.     NEEDLE, DISP, 25 G 25G X 1" MISC 1 application by Does not apply route every 30 (thirty) days. 6 each 0   telmisartan (MICARDIS) 40 MG tablet TAKE ONE TABLET (40 MG) BY MOUTH AT BEDTIME 90 tablet 1   triamcinolone cream (KENALOG) 0.1 % Apply topically 2 (two) times daily.     glipiZIDE (GLUCOTROL) 5 MG tablet TAKE ONE TABLET BY MOUTH TWICE DAILY BEFORE A MEAL 60 tablet 0   rosuvastatin (CRESTOR) 40 MG tablet TAKE ONE TABLET EACH EVENING 30 tablet 0   No facility-administered medications prior to visit.    Review of Systems;  Patient denies headache, fevers, malaise, unintentional weight loss, skin rash, eye pain, sinus congestion and sinus pain, sore throat, dysphagia,  hemoptysis , cough, dyspnea, wheezing, chest pain, palpitations, orthopnea, edema, abdominal pain, nausea, melena, diarrhea, constipation, flank pain, dysuria, hematuria, urinary  Frequency, nocturia, numbness, tingling, seizures,  Focal weakness, Loss of consciousness,  Tremor, insomnia, depression, anxiety, and suicidal ideation.      Objective:  BP 116/74   Pulse 69   Temp (!) 97.4 F (36.3 C) (Oral)   Ht 5\' 10"  (1.778 m)   Wt 179 lb 12.8 oz (81.6 kg)   SpO2 96%   BMI 25.80 kg/m   BP Readings from Last 3 Encounters:  03/07/23 116/74  03/08/22 128/74  06/10/21 130/80    Wt Readings from Last 3 Encounters:  03/07/23 179 lb 12.8 oz (81.6 kg)  08/16/22 196 lb (88.9 kg)  03/08/22 196 lb 9.6 oz (89.2 kg)    Physical Exam  Lab Results  Component Value Date   HGBA1C 6.0 07/02/2022   HGBA1C 6.7 (H) 03/05/2022   HGBA1C 7.3 (H) 10/30/2021    Lab Results  Component Value Date   CREATININE 0.95 07/02/2022    CREATININE 0.92 03/05/2022   CREATININE 1.00 10/30/2021    Lab Results  Component Value Date   WBC 7.5 12/09/2020   HGB 13.7 12/09/2020   HCT 41.2 12/09/2020   PLT 278.0 12/09/2020   GLUCOSE 129 (H) 07/02/2022   CHOL 129 07/02/2022   TRIG 147.0 07/02/2022   HDL 34.10 (L) 07/02/2022   LDLDIRECT 75.0 07/02/2022   LDLCALC 65 07/02/2022   ALT 17 07/02/2022   AST 17 07/02/2022   NA 140 07/02/2022   K 4.9 07/02/2022   CL 105 07/02/2022   CREATININE 0.95 07/02/2022   BUN 15 07/02/2022   CO2 27 07/02/2022   TSH 0.56 12/09/2020   PSA 1.61 08/25/2017   HGBA1C 6.0 07/02/2022   MICROALBUR 1.0 10/30/2021    DG Chest 2 View Result Date: 12/10/2020 CLINICAL DATA:  New atrial fibrillation.  Rule out CHF. EXAM: CHEST - 2 VIEW COMPARISON:  None. FINDINGS: Mild diffuse chronic interstitial coarsening. No focal consolidation, pleural effusion or pneumothorax. The cardiac silhouette is within normal limits. Degenerative changes of the spine. No acute osseous pathology. IMPRESSION: No active cardiopulmonary disease. Electronically Signed   By: Elgie Collard M.D.   On: 12/10/2020 01:23    Assessment & Plan:  .Essential hypertension -     Comprehensive metabolic panel -     Microalbumin / creatinine urine ratio  Type 2 DM with diabetic neuropathy affecting both sides of body (HCC) -     Comprehensive metabolic panel -     Hemoglobin A1c -     Microalbumin / creatinine urine ratio  Hyperlipidemia associated with type 2 diabetes mellitus (HCC) -     Lipid panel -     LDL cholesterol, direct  Cognitive changes  Unintentional weight loss of 10% body weight within 6 months Assessment & Plan: Attributed to smaller appetite. Patient  denies abd pain,  early satiety, nausea.  Dementia may be contributing as he was unaware of the 20 lb wt loss.   Checking A1c and thyroid  as well   Orders: -     TSH  B12 deficiency -     B12 and Folate Panel  Short-term memory loss -     MR BRAIN WO  CONTRAST; Future -     Ambulatory referral to Neurology  Mild vascular dementia without behavioral disturbance, psychotic disturbance, mood disturbance, or anxiety [facility]) Assessment & Plan: MMSE score today was 19/30 due to lack detail on dates,  concentration.  Clock drawing was normal .  MRI brain rdered  and Neurology referral made to Dr Sherryll Burger    Other orders -     glipiZIDE;  TAKE ONE TABLET BY MOUTH TWICE DAILY BEFORE A MEAL  Dispense: 180 tablet; Refill: 1 -     Rosuvastatin Calcium; TAKE ONE TABLET EACH EVENING  Dispense: 270 tablet; Refill: 1     I provided 30 minutes of face-to-face time during this encounter reviewing patient's last visit with me, patient's  most recent visit with cardiology,  nephrology,  and neurology,  recent surgical and non surgical procedures, previous  labs and imaging studies, counseling on currently addressed issues,  and post visit ordering to diagnostics and therapeutics .   Follow-up: No follow-ups on file.   Sherlene Shams, MD

## 2023-03-07 NOTE — Patient Instructions (Signed)
 I am referring you to Dr Sherryll Burger to discuss medications to help your memory  I have ordered an MRI of your brain to rule out various causes of memory loss   You have lost 20 lbs since last summer!  Please supplement your diet with a protein drink to avoid losing more muscle mass

## 2023-03-08 ENCOUNTER — Other Ambulatory Visit: Payer: Self-pay | Admitting: Internal Medicine

## 2023-03-08 LAB — COMPREHENSIVE METABOLIC PANEL
ALT: 15 U/L (ref 0–53)
AST: 17 U/L (ref 0–37)
Albumin: 4 g/dL (ref 3.5–5.2)
Alkaline Phosphatase: 59 U/L (ref 39–117)
BUN: 17 mg/dL (ref 6–23)
CO2: 28 meq/L (ref 19–32)
Calcium: 9.5 mg/dL (ref 8.4–10.5)
Chloride: 101 meq/L (ref 96–112)
Creatinine, Ser: 0.91 mg/dL (ref 0.40–1.50)
GFR: 79.4 mL/min (ref >60.00)
Glucose, Bld: 155 mg/dL — ABNORMAL HIGH (ref 70–99)
Potassium: 4.1 meq/L (ref 3.5–5.1)
Sodium: 138 meq/L (ref 135–145)
Total Bilirubin: 0.4 mg/dL (ref 0.2–1.2)
Total Protein: 6.5 g/dL (ref 6.0–8.3)

## 2023-03-08 LAB — LIPID PANEL
Cholesterol: 134 mg/dL (ref 0–200)
HDL: 37.4 mg/dL — ABNORMAL LOW (ref >39.00)
LDL Cholesterol: 65 mg/dL (ref 0–99)
NonHDL: 97.02
Total CHOL/HDL Ratio: 4
Triglycerides: 159 mg/dL — ABNORMAL HIGH (ref 0.0–149.0)
VLDL: 31.8 mg/dL (ref 0.0–40.0)

## 2023-03-08 LAB — MICROALBUMIN / CREATININE URINE RATIO
Creatinine,U: 41.7 mg/dL
Microalb Creat Ratio: 16.8 mg/g (ref 0.0–30.0)
Microalb, Ur: <0.7 mg/dL (ref 0.0–1.9)

## 2023-03-08 LAB — B12 AND FOLATE PANEL
Folate: 22.8 ng/mL (ref >5.9)
Vitamin B-12: 223 pg/mL (ref 211–911)

## 2023-03-08 LAB — LDL CHOLESTEROL, DIRECT: Direct LDL: 77 mg/dL

## 2023-03-08 LAB — TSH: TSH: 0.43 u[IU]/mL (ref 0.35–5.50)

## 2023-03-08 LAB — HEMOGLOBIN A1C: Hgb A1c MFr Bld: 6.2 % (ref 4.6–6.5)

## 2023-03-10 ENCOUNTER — Encounter: Payer: Self-pay | Admitting: Internal Medicine

## 2023-03-10 MED ORDER — CYANOCOBALAMIN 1000 MCG/ML IJ SOLN: 1000.0000 ug | INTRAMUSCULAR | 0 refills | Status: DC

## 2023-03-10 MED ORDER — CYANOCOBALAMIN 1000 MCG/ML IJ SOLN
1000.0000 ug | INTRAMUSCULAR | 3 refills | Status: AC
Start: 2023-04-07 — End: ?

## 2023-03-10 NOTE — Addendum Note (Signed)
 Addended by: Sherlene Shams on: 03/10/2023 06:53 PM   Modules accepted: Orders

## 2023-03-17 ENCOUNTER — Ambulatory Visit
Admission: RE | Admit: 2023-03-17 | Discharge: 2023-03-17 | Disposition: A | Discharge status: Home / Self Care | Payer: Medicare PPO | Source: Ambulatory Visit | Attending: Internal Medicine | Admitting: Internal Medicine

## 2023-03-17 DIAGNOSIS — F0390 Unspecified dementia without behavioral disturbance: Secondary | ICD-10-CM | POA: Diagnosis not present

## 2023-03-17 DIAGNOSIS — I6782 Cerebral ischemia: Secondary | ICD-10-CM | POA: Diagnosis not present

## 2023-03-17 DIAGNOSIS — R413 Other amnesia: Secondary | ICD-10-CM | POA: Insufficient documentation

## 2023-03-29 ENCOUNTER — Telehealth: Payer: Self-pay

## 2023-03-29 NOTE — Telephone Encounter (Signed)
 Copied from CRM [id number]. Topic: Clinical - Lab/Test Results >> Mar 29, 2023  4:09 PM Fredrich Romans wrote: Reason for CRM: Patients wife called in to ask Bout MRI results from MRI that her husband had done on 03/17/2023.

## 2023-03-30 NOTE — Telephone Encounter (Signed)
 MRI RESULTS HAVE NOW BEEN READ BY RADIOLOGY DEPT. PLEASE ADVISE

## 2023-03-30 NOTE — Telephone Encounter (Signed)
 Spoke with pt and informed him of his MRI results. Pt gave a verbal understanding.

## 2023-03-30 NOTE — Telephone Encounter (Signed)
 Spoke with [facility] Radiology Reading Room to have the patient results be added to the STAT list for reading results. Patient wife is notified and made aware of. Verbalized understanding.

## 2023-05-04 ENCOUNTER — Encounter: Payer: Self-pay | Admitting: Cardiology

## 2023-05-04 ENCOUNTER — Ambulatory Visit: Attending: Cardiology | Admitting: Cardiology

## 2023-05-04 VITALS — BP 110/60 | HR 67 | Ht 69.0 in | Wt 181.0 lb

## 2023-05-04 DIAGNOSIS — I10 Essential (primary) hypertension: Secondary | ICD-10-CM | POA: Diagnosis not present

## 2023-05-04 DIAGNOSIS — E1169 Type 2 diabetes mellitus with other specified complication: Secondary | ICD-10-CM | POA: Diagnosis not present

## 2023-05-04 DIAGNOSIS — I471 Supraventricular tachycardia, unspecified: Secondary | ICD-10-CM

## 2023-05-04 DIAGNOSIS — I491 Atrial premature depolarization: Secondary | ICD-10-CM | POA: Diagnosis not present

## 2023-05-04 DIAGNOSIS — E785 Hyperlipidemia, unspecified: Secondary | ICD-10-CM | POA: Diagnosis not present

## 2023-05-04 DIAGNOSIS — I429 Cardiomyopathy, unspecified: Secondary | ICD-10-CM

## 2023-05-04 NOTE — Patient Instructions (Signed)
 Medication Instructions:  Your physician recommends that you continue on your current medications as directed. Please refer to the Current Medication list given to you today.  *If you need a refill on your cardiac medications before your next appointment, please call your pharmacy*  Lab Work: No labs ordered today  If you have labs (blood work) drawn today and your tests are completely normal, you will receive your results only by: MyChart Message (if you have MyChart) OR A paper copy in the mail If you have any lab test that is abnormal or we need to change your treatment, we will call you to review the results.  Testing/Procedures: No test ordered today   Follow-Up: At [facility], you and your health needs are our priority.  As part of our continuing mission to provide you with exceptional heart care, our providers are all part of one team.  This team includes your primary Cardiologist (physician) and Advanced Practice Providers or APPs (Physician Assistants and Nurse Practitioners) who all work together to provide you with the care you need, when you need it.  Your next appointment:   1 year(s)  Provider:   You may see Sammy Crisp, MD or one of the following Advanced Practice Providers on your designated Care Team:   Laneta Pintos, NP Gildardo Labrador, PA-C Varney Gentleman, PA-C Cadence [place], PA-C Ronald Cockayne, NP Morey Ar, NP    We recommend signing up for the patient portal called "MyChart".  Sign up information is provided on this After Visit Summary.  MyChart is used to connect with patients for Virtual Visits (Telemedicine).  Patients are able to view lab/test results, encounter notes, upcoming appointments, etc.  Non-urgent messages can be sent to your provider as well.   To learn more about what you can do with MyChart, go to [email or web address].

## 2023-05-04 NOTE — Progress Notes (Signed)
 Cardiology Office Note:  .   Date:  05/04/2023  ID:  [name] [name], DOB [birth year], MRN [id number] PCP: Sherlene Shams, MD  [facility] Providers Cardiologist:  Yvonne Kendall, MD    History of Present Illness: .   [name] [name] is a 83 y.o. male with a past medical history of premature atrial contractions, paroxysmal supraventricular tachycardia, hypertension, hyperlipidemia, type 2 diabetes, peripheral neuropathy, who presents today for follow-up of his premature atrial contractions.  He previously started following with Dr. Melida Gimenez for atrial arrhythmia with question of atrial fibrillation that occurred in the setting of cataract surgery.  EKG was not consistent with atrial fibrillation.  He was then placed on an event monitor which showed frequent PACs and multiple atrial runs without evidence of atrial fibrillation.  Echocardiogram demonstrated mildly reduced LVEF of 50%.  He was continued on low-dose carvedilol but remained asymptomatic.  He was last seen in clinic 06/06/2021 and was doing well.  Denies chest pain, shortness of breath, palpitations lightheadedness or edema.  There were no changes made to his current medication regimen and no further testing that was ordered at that time.  He returns to clinic today accompanied by his wife.  Overall he has been doing well from a cardiac perspective.  He denies any chest pain, shortness of breath, palpitations, peripheral edema.  States that he has been compliant with his current medication regimen without any undue side effects.  Denies any hospitalizations or visits to the emergency department.  According to his wife he has had some issues with memory loss from time to time.  ROS: 10 point review of systems is considered negative with the exception of what is been listed in the HPI  Studies Reviewed: Marland Kitchen   EKG Interpretation Date/Time:  Wednesday May 04 2023 14:28:04 EDT Ventricular Rate:  67 PR Interval:  192 QRS  Duration:  110 QT Interval:  408 QTC Calculation: 431 R Axis:   -58  Text Interpretation: Sinus rhythm with Premature supraventricular complexes Left anterior fasicular block Possible Anterolateral infarct , age undetermined When compared with ECG of 08-Dec-2020 07:13, Confirmed by Charlsie Quest ([id number]) on 05/04/2023 3:10:30 PM    EP Procedures and Devices: 14-day event monitor (12/19/2020): Predominantly sinus rhythm with frequent PACs and multiple atrial runs lasting up to 12 seconds.  No definite evidence of atrial fibrillation.   Non-Invasive Evaluation(s): TTE (01/22/2021): Normal LV size and wall thickness.  LVEF 50% with dyskinetic septum as well as basal inferoposterior hypokinesis.  GLS -16.9%.  Normal RV size and function.  Normal biatrial size.  No pericardial effusion.  Mild tricuspid regurgitation.  Aortic sclerosis without stenosis.   Risk Assessment/Calculations:             Physical Exam:   VS:  BP 110/60 (BP Location: Left Arm)   Pulse 67   Ht 5\' 9"  (1.753 m)   Wt 181 lb (82.1 kg)   SpO2 95%   BMI 26.73 kg/m    Wt Readings from Last 3 Encounters:  05/04/23 181 lb (82.1 kg)  03/07/23 179 lb 12.8 oz (81.6 kg)  08/16/22 196 lb (88.9 kg)    GEN: Well nourished, well developed in no acute distress NECK: No JVD; No carotid bruits CARDIAC: RRR, no murmurs, rubs, gallops RESPIRATORY:  Clear to auscultation without rales, wheezing or rhonchi  ABDOMEN: Soft, non-tender, non-distended EXTREMITIES:  No edema; No deformity   ASSESSMENT AND PLAN: .   PACs and PSVT with continued palpitations reported.  EKG today reveals sinus rhythm with PACs and left anterior fascicular and anterior lateral infarct with no acute change from prior studies.  No atrial fibrillation continues to be identified.  He has been continued on low-dose carvedilol 3.125 mg twice daily.  Without any atrial fibrillation recognized there is no indication for anticoagulation.  Cardiomyopathy with low normal  to mildly reduced LVEF noted on echocardiogram in January.  He continues to appear euvolemic without signs of decompensation.  He is continued on his current regimen of carvedilol and telmisartan.  Primary hypertension with a blood pressure today of 110/60.  Blood pressure has remained stable.  He is continued on his current regimen of amlodipine 10 mg daily, carvedilol 3.25 mg twice daily and telmisartan 40 mg at bedtime.  He is encouraged to continue to monitor his blood pressure 1 to 2 hours postmedication administration as well.  Mixed hyperlipidemia with associated type 2 diabetes with last LDL of 65.  He rosuvastatin 40 mg daily.  Ongoing management per PCP.       Dispo: Patient to return clinic to see MD/APP in 11 to 12 months or sooner if needed.  Signed, Artin Mceuen, NP

## 2023-05-05 ENCOUNTER — Other Ambulatory Visit: Payer: Self-pay | Admitting: Internal Medicine

## 2023-05-13 ENCOUNTER — Other Ambulatory Visit: Payer: Self-pay | Admitting: Internal Medicine

## 2023-05-25 ENCOUNTER — Ambulatory Visit: Payer: Medicare PPO | Admitting: Internal Medicine

## 2023-06-04 DIAGNOSIS — Z20828 Contact with and (suspected) exposure to other viral communicable diseases: Secondary | ICD-10-CM | POA: Diagnosis not present

## 2023-06-07 ENCOUNTER — Encounter: Payer: Self-pay | Admitting: Internal Medicine

## 2023-06-07 ENCOUNTER — Ambulatory Visit: Payer: Medicare PPO | Admitting: Internal Medicine

## 2023-06-07 DIAGNOSIS — E1142 Type 2 diabetes mellitus with diabetic polyneuropathy: Secondary | ICD-10-CM

## 2023-06-07 DIAGNOSIS — E66811 Obesity, class 1: Secondary | ICD-10-CM

## 2023-06-07 DIAGNOSIS — I10 Essential (primary) hypertension: Secondary | ICD-10-CM

## 2023-06-07 DIAGNOSIS — Z7985 Long-term (current) use of injectable non-insulin antidiabetic drugs: Secondary | ICD-10-CM

## 2023-06-07 DIAGNOSIS — E1169 Type 2 diabetes mellitus with other specified complication: Secondary | ICD-10-CM

## 2023-06-07 DIAGNOSIS — E785 Hyperlipidemia, unspecified: Secondary | ICD-10-CM

## 2023-06-07 DIAGNOSIS — D487 Neoplasm of uncertain behavior of other specified sites: Secondary | ICD-10-CM | POA: Diagnosis not present

## 2023-06-07 DIAGNOSIS — F03B4 Unspecified dementia, moderate, with anxiety: Secondary | ICD-10-CM

## 2023-06-07 MED ORDER — TELMISARTAN 40 MG PO TABS: ORAL_TABLET | ORAL | 1 refills | Status: DC

## 2023-06-07 MED ORDER — CARVEDILOL 3.125 MG PO TABS: 3.1250 mg | ORAL_TABLET | Freq: Two times a day (BID) | ORAL | 0 refills | Status: DC

## 2023-06-07 MED ORDER — ESCITALOPRAM OXALATE 10 MG PO TABS
10.0000 mg | ORAL_TABLET | Freq: Every day | ORAL | 2 refills | Status: DC
Start: 2023-06-07 — End: 2023-09-08

## 2023-06-07 MED ORDER — FENOFIBRIC ACID 135 MG PO CPDR: 1.0000 | DELAYED_RELEASE_CAPSULE | Freq: Every day | ORAL | 1 refills | Status: DC

## 2023-06-07 MED ORDER — AMLODIPINE BESYLATE 10 MG PO TABS: ORAL_TABLET | ORAL | 1 refills | Status: DC

## 2023-06-07 NOTE — Progress Notes (Signed)
 Subjective:  Patient ID: [name] [name], male    DOB: 03/18/1940  Age: 83 y.o. MRN: [id number]  CC: The primary encounter diagnosis was Essential hypertension. Diagnoses of Type 2 DM with diabetic neuropathy affecting both sides of body (HCC), Hyperlipidemia associated with type 2 diabetes mellitus (HCC), Neoplasm of uncertain behavior of face, Moderate dementia with anxiety, unspecified dementia type (HCC), and Obesity (BMI 30.0-34.9) were also pertinent to this visit.   HPI TRAVES MAJCHRZAK presents for  Chief Complaint  Patient presents with   Medical Management of Chronic Issues    3 month follow up    1) dementia ; patient was sent for a brain MRI after last visit and is here to discuss the results.  He is is accompanied by his wife, Bartholomew Light,  who reports privately that his cognitive decline has created behavior suggestive of frustration and  anxiety,  and he has become quite irritable and verbally aderversarial, using profane language.  She denies sundowning symptoms and he is nor physically aggressive.  She states that his driving skills are still very good but his navigational skills have dropped and he does not drive unless she is with him.  Today he is is usual cheerful self,  but when asked about any difficulties he may be having,  he just points at his wife and  gesticulates a message that she is nagging at him all the time.   MRI Brain: No acute or reversible finding. Widespread advanced chronic small vessel ischemic change of the cerebral hemispheric white matter.  He was referred in February to Dr Mason Sole but has not been seen yet .  He continues to blame his wife for everything     2) T2DM:  he  feels generally well,  But is not  exercising regularly or trying to lose weight. Checking  blood sugars less than once daily at variable times, usually only if she feels she may be having a hypoglycemic event. .  BS have been under 130 fasting and < 150 post prandially.  Denies any recent  hypoglyemic events.  Taking   medications as directed. Following a carbohydrate modified diet 6 days per week. He has a significant neuropathy involving both legs and wears braces to manage his foot drop.   3) Skin lesion  :  he has develop a nodule above his right eyebrow that requires evaluation dermatology    Outpatient Medications Prior to Visit  Medication Sig Dispense Refill   aspirin 81 MG tablet Take 81 mg by mouth daily.     B-D 3CC LUER-LOK SYR 25GX1" 25G X 1" 3 ML MISC USE AS DIRECTED FOR B12 INJECTIONS 6 each 0   blood glucose meter kit and supplies KIT Use once daily to check blood sugars (90 day supply) 1 each 0   Cholecalciferol (D3-1000) 25 MCG (1000 UT) capsule Take 1,000 Units by mouth daily.     co-enzyme Q-10 30 MG capsule Take 30 mg by mouth daily.     cyanocobalamin  (VITAMIN B12) 1000 MCG/ML injection Inject 1 mL (1,000 mcg total) into the muscle every 30 (thirty) days. 3 mL 3   FARXIGA  5 MG TABS tablet TAKE 1 TABLET BY MOUTH DAILY BEFORE BREAKFAST 90 tablet 2   fish oil-omega-3 fatty acids 1000 MG capsule Take 2 g by mouth daily.     glipiZIDE  (GLUCOTROL ) 5 MG tablet TAKE ONE TABLET BY MOUTH TWICE DAILY BEFORE A MEAL 180 tablet 1   metFORMIN  (GLUCOPHAGE ) 1000 MG tablet  TAKE ONE TABLET TWICE A DAY WITH MEALS 180 tablet 1   Multiple Vitamin (MULTIVITAMIN) tablet Take 1 tablet by mouth daily.     NEEDLE, DISP, 25 G 25G X 1" MISC 1 application by Does not apply route every 30 (thirty) days. 6 each 0   rosuvastatin  (CRESTOR ) 40 MG tablet TAKE ONE TABLET EACH EVENING 270 tablet 1   amLODipine  (NORVASC ) 10 MG tablet TAKE ONE TABLET (10 MG) BY MOUTH EVERY DAY 90 tablet 1   carvedilol  (COREG ) 3.125 MG tablet TAKE ONE TABLET BY MOUTH TWICE DAILY WITH A MEAL 60 tablet 0   Choline Fenofibrate  (FENOFIBRIC ACID ) 135 MG CPDR TAKE 1 CAPSULE BY MOUTH EVERY DAY. 90 capsule 1   telmisartan  (MICARDIS ) 40 MG tablet TAKE ONE TABLET (40 MG) BY MOUTH AT BEDTIME 90 tablet 1   No  facility-administered medications prior to visit.    Review of Systems;  Patient denies headache, fevers, malaise, unintentional weight loss, skin rash, eye pain, sinus congestion and sinus pain, sore throat, dysphagia,  hemoptysis , cough, dyspnea, wheezing, chest pain, palpitations, orthopnea, edema, abdominal pain, nausea, melena, diarrhea, constipation, flank pain, dysuria, hematuria, urinary  Frequency, nocturia, numbness, tingling, seizures,  Focal weakness, Loss of consciousness,  Tremor, insomnia, depression, anxiety, and suicidal ideation.      Objective:  There were no vitals taken for this visit.  BP Readings from Last 3 Encounters:  05/04/23 110/60  03/07/23 116/74  03/08/22 128/74    Wt Readings from Last 3 Encounters:  05/04/23 181 lb (82.1 kg)  03/07/23 179 lb 12.8 oz (81.6 kg)  08/16/22 196 lb (88.9 kg)    Physical Exam Vitals reviewed.  Constitutional:      General: He is not in acute distress.    Appearance: Normal appearance. He is normal weight. He is not ill-appearing, toxic-appearing or diaphoretic.  HENT:     Head: Normocephalic.  Eyes:     General: No scleral icterus.       Right eye: No discharge.        Left eye: No discharge.     Conjunctiva/sclera: Conjunctivae normal.  Cardiovascular:     Rate and Rhythm: Normal rate and regular rhythm.     Heart sounds: Normal heart sounds.  Pulmonary:     Effort: Pulmonary effort is normal. No respiratory distress.     Breath sounds: Normal breath sounds.  Musculoskeletal:        General: Normal range of motion.     Cervical back: Normal range of motion.  Skin:    General: Skin is warm and dry.  Neurological:     General: No focal deficit present.     Mental Status: He is alert and oriented to person, place, and time. Mental status is at baseline.  Psychiatric:        Mood and Affect: Mood normal.        Behavior: Behavior normal.        Thought Content: Thought content normal.        Judgment:  Judgment normal.      . Lab Results  Component Value Date   HGBA1C 6.2 03/07/2023   HGBA1C 6.0 07/02/2022   HGBA1C 6.7 (H) 03/05/2022    Lab Results  Component Value Date   CREATININE 0.91 03/07/2023   CREATININE 0.95 07/02/2022   CREATININE 0.92 03/05/2022    Lab Results  Component Value Date   WBC 7.5 12/09/2020   HGB 13.7 12/09/2020   HCT 41.2 12/09/2020  PLT 278.0 12/09/2020   GLUCOSE 155 (H) 03/07/2023   CHOL 134 03/07/2023   TRIG 159.0 (H) 03/07/2023   HDL 37.40 (L) 03/07/2023   LDLDIRECT 77.0 03/07/2023   LDLCALC 65 03/07/2023   ALT 15 03/07/2023   AST 17 03/07/2023   NA 138 03/07/2023   K 4.1 03/07/2023   CL 101 03/07/2023   CREATININE 0.91 03/07/2023   BUN 17 03/07/2023   CO2 28 03/07/2023   TSH 0.43 03/07/2023   PSA 1.61 08/25/2017   HGBA1C 6.2 03/07/2023   MICROALBUR <0.7 03/07/2023    MR Brain Wo Contrast Result Date: 03/30/2023 CLINICAL DATA:  Worsening memory loss.  Dementia. EXAM: MRI HEAD WITHOUT CONTRAST TECHNIQUE: Multiplanar, multiecho pulse sequences of the brain and surrounding structures were obtained without intravenous contrast. COMPARISON:  None Available. FINDINGS: Brain: Diffusion imaging does not show any acute or subacute infarction or other cause of restricted diffusion. No focal abnormality affects the brainstem or cerebellum. Cerebral hemispheres show a widespread advanced patchy and confluent chronic small vessel ischemic change of the white matter. No evidence of large vessel territory stroke. No mass, hemorrhage, hydrocephalus or extra-axial collection. Vascular: Major vessels at the base of the brain show flow. Skull and upper cervical spine: Negative Sinuses/Orbits: Clear/normal Other: None IMPRESSION: No acute or reversible finding. Widespread advanced chronic small vessel ischemic change of the cerebral hemispheric white matter. Electronically Signed   By: Bettylou Brunner M.D.   On: 03/30/2023 09:16    Assessment & Plan:   .Essential hypertension Assessment & Plan: Well controlled on current regimen of amlodipine , carvedilol , and telmisartan  .  Renal function stable, no changes today.  Lab Results  Component Value Date   CREATININE 0.91 03/07/2023   Lab Results  Component Value Date   NA 138 03/07/2023   K 4.1 03/07/2023   CL 101 03/07/2023   CO2 28 03/07/2023     Orders: -     Comprehensive metabolic panel with GFR  Type 2 DM with diabetic neuropathy affecting both sides of body (HCC) Assessment & Plan: Control has  improved with addition of  Farxiga  5 mg to current regimen of metformin  and glipizide .  No changes today   Lab Results  Component Value Date   HGBA1C 6.2 03/07/2023   Lab Results  Component Value Date   MICROALBUR <0.7 03/07/2023   MICROALBUR 1.0 10/30/2021       Orders: -     Hemoglobin A1c -     Comprehensive metabolic panel with GFR  Hyperlipidemia associated with type 2 diabetes mellitus (HCC) Assessment & Plan: LDL and triglycerides are at goal on  rosuvastatin  and fenofibrate , and he is taking a baby aspirin daily.  He has no side effects and liver enzymes are normal.   Lab Results  Component Value Date   CHOL 134 03/07/2023   HDL 37.40 (L) 03/07/2023   LDLCALC 65 03/07/2023   LDLDIRECT 77.0 03/07/2023   TRIG 159.0 (H) 03/07/2023   CHOLHDL 4 03/07/2023   Lab Results  Component Value Date   ALT 15 03/07/2023   AST 17 03/07/2023   ALKPHOS 59 03/07/2023   BILITOT 0.4 03/07/2023     Orders: -     Lipid panel -     LDL cholesterol, direct  Neoplasm of uncertain behavior of face -     Ambulatory referral to Dermatology  Moderate dementia with anxiety, unspecified dementia type [facility]) Assessment & Plan: Given the progression,  I suspect he has a mixed vascular/Alzheimers etiology,  referral to Dr Mason Sole is underway.  Starting lexapro for the anxiety    Obesity (BMI 30.0-34.9) Assessment & Plan: He is losing weigh steadily I  by following  a low  glycemic index diet    Other orders -     amLODIPine  Besylate; TAKE ONE TABLET (10 MG) BY MOUTH EVERY DAY  Dispense: 90 tablet; Refill: 1 -     Carvedilol ; Take 1 tablet (3.125 mg total) by mouth 2 (two) times daily with a meal.  Dispense: 60 tablet; Refill: 0 -     Fenofibric Acid ; Take 1 tablet by mouth daily.  Dispense: 90 capsule; Refill: 1 -     Telmisartan ; TAKE ONE TABLET (40 MG) BY MOUTH AT BEDTIME  Dispense: 90 tablet; Refill: 1 -     Escitalopram Oxalate; Take 1 tablet (10 mg total) by mouth daily.  Dispense: 30 tablet; Refill: 2     I spent 40 minutes on the day of this face to face encounter reviewing patient's  most recent visit with cardiology,   prior relevant surgical and non surgical procedures, reviewing recent  MRI and labs, counseling on weight management,  reviewing the assessment and plan with patient, and post visit ordering and reviewing of  diagnostics and therapeutics with patient  .   Follow-up: Return in about 3 months (around 09/06/2023) for follow up diabetes.   Thersia Flax, MD

## 2023-06-07 NOTE — Patient Instructions (Addendum)
 Please call Dr Carlis Cherry , at [facility]  to schedule an office visit to evaluate Jim's cognitive decline.   The referral was made In February  [address]  [facility]  [place], [place] [address]  [phone] (Work)     Please have Josiah Nigh start the Lexapro (escitalopram) at 1/2 tablet daily in the  morning after breakfast for  the first few days to avoid nausea.  You can increase to a full tablet after 4 days if you havenot developed side effects of nausea.

## 2023-06-08 LAB — COMPREHENSIVE METABOLIC PANEL WITH GFR
ALT: 17 U/L (ref 0–53)
AST: 20 U/L (ref 0–37)
Albumin: 4.3 g/dL (ref 3.5–5.2)
Alkaline Phosphatase: 60 U/L (ref 39–117)
BUN: 23 mg/dL (ref 6–23)
CO2: 27 meq/L (ref 19–32)
Calcium: 9.9 mg/dL (ref 8.4–10.5)
Chloride: 102 meq/L (ref 96–112)
Creatinine, Ser: 0.97 mg/dL (ref 0.40–1.50)
GFR: 73.42 mL/min (ref >60.00)
Glucose, Bld: 64 mg/dL — ABNORMAL LOW (ref 70–99)
Potassium: 4.2 meq/L (ref 3.5–5.1)
Sodium: 139 meq/L (ref 135–145)
Total Bilirubin: 0.4 mg/dL (ref 0.2–1.2)
Total Protein: 6.8 g/dL (ref 6.0–8.3)

## 2023-06-08 LAB — LDL CHOLESTEROL, DIRECT: Direct LDL: 78 mg/dL

## 2023-06-08 LAB — LIPID PANEL
Cholesterol: 146 mg/dL (ref 0–200)
HDL: 43.2 mg/dL (ref >39.00)
LDL Cholesterol: 71 mg/dL (ref 0–99)
NonHDL: 102.56
Total CHOL/HDL Ratio: 3
Triglycerides: 160 mg/dL — ABNORMAL HIGH (ref 0.0–149.0)
VLDL: 32 mg/dL (ref 0.0–40.0)

## 2023-06-08 LAB — HEMOGLOBIN A1C: Hgb A1c MFr Bld: 6.2 % (ref 4.6–6.5)

## 2023-06-08 NOTE — Assessment & Plan Note (Signed)
 Well controlled on current regimen of amlodipine , carvedilol , and telmisartan  .  Renal function stable, no changes today.  Lab Results  Component Value Date   CREATININE 0.91 03/07/2023   Lab Results  Component Value Date   NA 138 03/07/2023   K 4.1 03/07/2023   CL 101 03/07/2023   CO2 28 03/07/2023

## 2023-06-08 NOTE — Assessment & Plan Note (Signed)
 Control has  improved with addition of  Farxiga  5 mg to current regimen of metformin  and glipizide .  No changes today   Lab Results  Component Value Date   HGBA1C 6.2 03/07/2023   Lab Results  Component Value Date   MICROALBUR <0.7 03/07/2023   MICROALBUR 1.0 10/30/2021

## 2023-06-08 NOTE — Assessment & Plan Note (Signed)
 He is losing weigh steadily I  by following  a low glycemic index diet

## 2023-06-08 NOTE — Assessment & Plan Note (Signed)
 LDL and triglycerides are at goal on  rosuvastatin  and fenofibrate , and he is taking a baby aspirin daily.  He has no side effects and liver enzymes are normal.   Lab Results  Component Value Date   CHOL 134 03/07/2023   HDL 37.40 (L) 03/07/2023   LDLCALC 65 03/07/2023   LDLDIRECT 77.0 03/07/2023   TRIG 159.0 (H) 03/07/2023   CHOLHDL 4 03/07/2023   Lab Results  Component Value Date   ALT 15 03/07/2023   AST 17 03/07/2023   ALKPHOS 59 03/07/2023   BILITOT 0.4 03/07/2023

## 2023-06-08 NOTE — Assessment & Plan Note (Signed)
 Given the progression,  I suspect he has a mixed vascular/Alzheimers etiology,  referral to Dr Mason Sole is underway.  Starting lexapro for the anxiety

## 2023-06-09 ENCOUNTER — Ambulatory Visit: Payer: Self-pay | Admitting: Internal Medicine

## 2023-06-16 ENCOUNTER — Other Ambulatory Visit: Payer: Self-pay | Admitting: Internal Medicine

## 2023-07-08 DIAGNOSIS — Z20828 Contact with and (suspected) exposure to other viral communicable diseases: Secondary | ICD-10-CM | POA: Diagnosis not present

## 2023-07-09 DIAGNOSIS — Z20828 Contact with and (suspected) exposure to other viral communicable diseases: Secondary | ICD-10-CM | POA: Diagnosis not present

## 2023-07-15 ENCOUNTER — Other Ambulatory Visit: Payer: Self-pay | Admitting: Internal Medicine

## 2023-08-08 DIAGNOSIS — F0781 Postconcussional syndrome: Secondary | ICD-10-CM | POA: Diagnosis not present

## 2023-08-08 DIAGNOSIS — Z1331 Encounter for screening for depression: Secondary | ICD-10-CM | POA: Diagnosis not present

## 2023-08-08 DIAGNOSIS — G629 Polyneuropathy, unspecified: Secondary | ICD-10-CM | POA: Diagnosis not present

## 2023-08-08 DIAGNOSIS — G4733 Obstructive sleep apnea (adult) (pediatric): Secondary | ICD-10-CM | POA: Diagnosis not present

## 2023-08-08 DIAGNOSIS — F09 Unspecified mental disorder due to known physiological condition: Secondary | ICD-10-CM | POA: Diagnosis not present

## 2023-08-08 DIAGNOSIS — M5416 Radiculopathy, lumbar region: Secondary | ICD-10-CM | POA: Diagnosis not present

## 2023-08-17 ENCOUNTER — Other Ambulatory Visit: Payer: Self-pay | Admitting: Internal Medicine

## 2023-08-22 ENCOUNTER — Telehealth: Payer: Self-pay | Admitting: *Deleted

## 2023-08-22 ENCOUNTER — Ambulatory Visit

## 2023-08-22 NOTE — Telephone Encounter (Signed)
 FYI Called to perform AWV. Patient stated that he was not interested in doing the visit and does not find it to be necessary. Tried to explain to the patient the benefits of dong AWV. Patient stated that he is not interested in doing the visit annually and to put that on his chart. Appointment was cancelled.

## 2023-08-22 NOTE — Telephone Encounter (Signed)
 noted

## 2023-09-07 ENCOUNTER — Ambulatory Visit: Admitting: Internal Medicine

## 2023-09-07 ENCOUNTER — Encounter: Payer: Self-pay | Admitting: Internal Medicine

## 2023-09-07 VITALS — BP 100/58 | HR 63 | Ht 69.0 in | Wt 176.4 lb

## 2023-09-07 DIAGNOSIS — Z7984 Long term (current) use of oral hypoglycemic drugs: Secondary | ICD-10-CM

## 2023-09-07 DIAGNOSIS — F03B4 Unspecified dementia, moderate, with anxiety: Secondary | ICD-10-CM

## 2023-09-07 DIAGNOSIS — I10 Essential (primary) hypertension: Secondary | ICD-10-CM

## 2023-09-07 DIAGNOSIS — E1169 Type 2 diabetes mellitus with other specified complication: Secondary | ICD-10-CM

## 2023-09-07 DIAGNOSIS — E119 Type 2 diabetes mellitus without complications: Secondary | ICD-10-CM

## 2023-09-07 DIAGNOSIS — E1142 Type 2 diabetes mellitus with diabetic polyneuropathy: Secondary | ICD-10-CM | POA: Diagnosis not present

## 2023-09-07 DIAGNOSIS — E785 Hyperlipidemia, unspecified: Secondary | ICD-10-CM | POA: Diagnosis not present

## 2023-09-07 MED ORDER — METFORMIN HCL 850 MG PO TABS: ORAL_TABLET | ORAL | 1 refills | Status: DC

## 2023-09-07 NOTE — Progress Notes (Unsigned)
 Subjective:  Patient ID: [name] [name], male    DOB: [birth year]  Age: 83 y.o. MRN: [id number]  CC: The primary encounter diagnosis was Essential hypertension. Diagnoses of Type 2 DM with diabetic neuropathy affecting both sides of body (HCC), Hyperlipidemia associated with type 2 diabetes mellitus (HCC), Moderate dementia with anxiety, unspecified dementia type (HCC), and Diabetes mellitus treated with oral medication (HCC) were also pertinent to this visit.   HPI [name] [name] presents for  Chief Complaint  Patient presents with   Medical Management of Chronic Issues    6 month follow up   1)  TYPE 2 dm:  he  feels generally well,  But is not  exercising regularly  DUE TO dementia and foot drop  . Checking  blood sugars less than once daily at variable times, usually only if she feels she may be having a hypoglycemic event. .  BS have been under 130 fasting and < 150 post prandially.  Denies any recent hypoglyemic events.  Taking   medications as directed. Following a carbohydrate modified diet 6 days per week. Denies numbness, burning and tingling of extremities. Appetite is good.    2) HTN:  Hypertension: patient checks blood pressure twice  monthly  at home.  Readings have been for the most part <130/80 at rest . Patient is following a reduced salt diet most days and is taking medications as prescribed   3) Dementia:  reviewed initial evaluation by Dr Maree in mid July sSLUMS 9/30  .testing for Alzheimers ordered ; ddx included chronic traumatic encephalopathy , mixed vascular/alzheimers dementoa    Outpatient Medications Prior to Visit  Medication Sig Dispense Refill   amLODipine  (NORVASC ) 10 MG tablet TAKE ONE TABLET (10 MG) BY MOUTH EVERY DAY 90 tablet 1   aspirin 81 MG tablet Take 81 mg by mouth daily.     B-D 3CC LUER-LOK SYR 25GX1 25G X 1 3 ML MISC USE AS DIRECTED FOR B12 INJECTIONS 6 each 0   blood glucose meter kit and supplies KIT Use once daily to check blood sugars (90 day  supply) 1 each 0   carvedilol  (COREG ) 3.125 MG tablet TAKE 1 TABLET BY MOUTH TWICE DAILY WITH MEALS 60 tablet 1   Cholecalciferol (D3-1000) 25 MCG (1000 UT) capsule Take 1,000 Units by mouth daily.     Choline Fenofibrate  (FENOFIBRIC ACID ) 135 MG CPDR Take 1 tablet by mouth daily. 90 capsule 1   co-enzyme Q-10 30 MG capsule Take 30 mg by mouth daily.     cyanocobalamin  (VITAMIN B12) 1000 MCG/ML injection Inject 1 mL (1,000 mcg total) into the muscle every 30 (thirty) days. 3 mL 3   donepezil (ARICEPT) 5 MG tablet Take 5 mg by mouth at bedtime.     FARXIGA  5 MG TABS tablet TAKE 1 TABLET BY MOUTH DAILY BEFORE BREAKFAST 90 tablet 2   fish oil-omega-3 fatty acids 1000 MG capsule Take 2 g by mouth daily.     glipiZIDE  (GLUCOTROL ) 5 MG tablet TAKE ONE TABLET BY MOUTH TWICE DAILY BEFORE A MEAL 180 tablet 1   Multiple Vitamin (MULTIVITAMIN) tablet Take 1 tablet by mouth daily.     NEEDLE, DISP, 25 G 25G X 1 MISC 1 application by Does not apply route every 30 (thirty) days. 6 each 0   rosuvastatin  (CRESTOR ) 40 MG tablet TAKE ONE TABLET EACH EVENING 90 tablet 1   telmisartan  (MICARDIS ) 40 MG tablet TAKE ONE TABLET (40 MG) BY MOUTH AT BEDTIME 90 tablet  1   escitalopram  (LEXAPRO ) 10 MG tablet Take 1 tablet (10 mg total) by mouth daily. 30 tablet 2   metFORMIN  (GLUCOPHAGE ) 1000 MG tablet TAKE ONE TABLET TWICE A DAY WITH MEALS 180 tablet 1   No facility-administered medications prior to visit.    Review of Systems;  Patient denies headache, fevers, malaise, unintentional weight loss, skin rash, eye pain, sinus congestion and sinus pain, sore throat, dysphagia,  hemoptysis , cough, dyspnea, wheezing, chest pain, palpitations, orthopnea, edema, abdominal pain, nausea, melena, diarrhea, constipation, flank pain, dysuria, hematuria, urinary  Frequency, nocturia, numbness, tingling, seizures,  Focal weakness, Loss of consciousness,  Tremor, insomnia, depression, anxiety, and suicidal ideation.       Objective:  BP (!) 100/58   Pulse 63   Ht 5' 9 (1.753 m)   Wt 176 lb 6.4 oz (80 kg)   SpO2 95%   BMI 26.05 kg/m   BP Readings from Last 3 Encounters:  09/07/23 (!) 100/58  05/04/23 110/60  03/07/23 116/74    Wt Readings from Last 3 Encounters:  09/07/23 176 lb 6.4 oz (80 kg)  05/04/23 181 lb (82.1 kg)  03/07/23 179 lb 12.8 oz (81.6 kg)    Physical Exam Vitals reviewed.  Constitutional:      General: He is not in acute distress.    Appearance: Normal appearance. He is normal weight. He is not ill-appearing, toxic-appearing or diaphoretic.  HENT:     Head: Normocephalic.  Eyes:     General: No scleral icterus.       Right eye: No discharge.        Left eye: No discharge.     Conjunctiva/sclera: Conjunctivae normal.  Cardiovascular:     Rate and Rhythm: Normal rate and regular rhythm.     Heart sounds: Normal heart sounds.  Pulmonary:     Effort: Pulmonary effort is normal. No respiratory distress.     Breath sounds: Normal breath sounds.  Musculoskeletal:        General: Normal range of motion.     Cervical back: Normal range of motion.  Skin:    General: Skin is warm and dry.  Neurological:     General: No focal deficit present.     Mental Status: He is alert and oriented to person, place, and time. Mental status is at baseline.  Psychiatric:        Mood and Affect: Mood normal.        Behavior: Behavior normal.        Thought Content: Thought content normal.        Judgment: Judgment normal.     Lab Results  Component Value Date   HGBA1C 6.1 09/07/2023   HGBA1C 6.2 06/07/2023   HGBA1C 6.2 03/07/2023    Lab Results  Component Value Date   CREATININE 0.98 09/07/2023   CREATININE 0.97 06/07/2023   CREATININE 0.91 03/07/2023    Lab Results  Component Value Date   WBC 7.5 12/09/2020   HGB 13.7 12/09/2020   HCT 41.2 12/09/2020   PLT 278.0 12/09/2020   GLUCOSE 182 (H) 09/07/2023   CHOL 134 09/07/2023   TRIG 149.0 09/07/2023   HDL 40.10  09/07/2023   LDLDIRECT 71.0 09/07/2023   LDLCALC 64 09/07/2023   ALT 14 09/07/2023   AST 15 09/07/2023   NA 139 09/07/2023   K 4.3 09/07/2023   CL 102 09/07/2023   CREATININE 0.98 09/07/2023   BUN 16 09/07/2023   CO2 27 09/07/2023   TSH  0.43 03/07/2023   PSA 1.61 08/25/2017   HGBA1C 6.1 09/07/2023   MICROALBUR <0.7 03/07/2023    MR Brain Wo Contrast Result Date: 03/30/2023 CLINICAL DATA:  Worsening memory loss.  Dementia. EXAM: MRI HEAD WITHOUT CONTRAST TECHNIQUE: Multiplanar, multiecho pulse sequences of the brain and surrounding structures were obtained without intravenous contrast. COMPARISON:  None Available. FINDINGS: Brain: Diffusion imaging does not show any acute or subacute infarction or other cause of restricted diffusion. No focal abnormality affects the brainstem or cerebellum. Cerebral hemispheres show a widespread advanced patchy and confluent chronic small vessel ischemic change of the white matter. No evidence of large vessel territory stroke. No mass, hemorrhage, hydrocephalus or extra-axial collection. Vascular: Major vessels at the base of the brain show flow. Skull and upper cervical spine: Negative Sinuses/Orbits: Clear/normal Other: None IMPRESSION: No acute or reversible finding. Widespread advanced chronic small vessel ischemic change of the cerebral hemispheric white matter. Electronically Signed   By: Oneil Officer M.D.   On: 03/30/2023 09:16    Assessment & Plan:  .Essential hypertension Assessment & Plan: Well controlled on current regimen of amlodipine , carvedilol , and telmisartan  .  Renal function stable, no changes today.  Lab Results  Component Value Date   CREATININE 0.98 09/07/2023   Lab Results  Component Value Date   NA 139 09/07/2023   K 4.3 09/07/2023   CL 102 09/07/2023   CO2 27 09/07/2023     Orders: -     Comprehensive metabolic panel with GFR  Type 2 DM with diabetic neuropathy affecting both sides of body (HCC) Assessment &  Plan: Control has  improved with addition of  Farxiga  5 mg to current regimen of metformin  and glipizide .  No changes today   Lab Results  Component Value Date   HGBA1C 6.1 09/07/2023   Lab Results  Component Value Date   MICROALBUR <0.7 03/07/2023       Orders: -     Hemoglobin A1c -     Comprehensive metabolic panel with GFR  Hyperlipidemia associated with type 2 diabetes mellitus (HCC) Assessment & Plan: LDL and triglycerides are at goal on  rosuvastatin  and fenofibrate , and he is taking a baby aspirin daily.  He has no side effects and liver enzymes are normal.   Lab Results  Component Value Date   CHOL 134 09/07/2023   HDL 40.10 09/07/2023   LDLCALC 64 09/07/2023   LDLDIRECT 71.0 09/07/2023   TRIG 149.0 09/07/2023   CHOLHDL 3 09/07/2023   Lab Results  Component Value Date   ALT 14 09/07/2023   AST 15 09/07/2023   ALKPHOS 50 09/07/2023   BILITOT 0.4 09/07/2023     Orders: -     Lipid panel -     LDL cholesterol, direct  Moderate dementia with anxiety, unspecified dementia type (HCC) Assessment & Plan: Given the progression,  I suspect he has a mixed etiology including chronic traumatic encephalopathy , vascular/Alzheimers etiology,  per review of Dr Jamal evaluation .    Diabetes mellitus treated with oral medication [facility]) Assessment & Plan: Currently well-controlled on current medications .  hemoglobin A1c is at goal of less than 7.0 .    Other orders -     metFORMIN  HCl; TAKE ONE TABLET TWICE A DAY WITH MEALS  Dispense: 180 tablet; Refill: 1    Follow-up: No follow-ups on file.   Verneita LITTIE Kettering, MD

## 2023-09-07 NOTE — Assessment & Plan Note (Addendum)
 Given the progression,  I suspect he has a mixed etiology including chronic traumatic encephalopathy , vascular/Alzheimers etiology,  per review of Dr Jamal evaluation .

## 2023-09-07 NOTE — Patient Instructions (Signed)
 Good to see you!   I will let you know how your blood work looks in a day or two  Continue your current medications .  [name] [name] will likely be sent home tomorrow or the day after from [facility]

## 2023-09-08 ENCOUNTER — Other Ambulatory Visit: Payer: Self-pay | Admitting: Internal Medicine

## 2023-09-08 DIAGNOSIS — E119 Type 2 diabetes mellitus without complications: Secondary | ICD-10-CM | POA: Insufficient documentation

## 2023-09-08 LAB — COMPREHENSIVE METABOLIC PANEL WITH GFR
ALT: 14 U/L (ref 0–53)
AST: 15 U/L (ref 0–37)
Albumin: 3.9 g/dL (ref 3.5–5.2)
Alkaline Phosphatase: 50 U/L (ref 39–117)
BUN: 16 mg/dL (ref 6–23)
CO2: 27 meq/L (ref 19–32)
Calcium: 9.1 mg/dL (ref 8.4–10.5)
Chloride: 102 meq/L (ref 96–112)
Creatinine, Ser: 0.98 mg/dL (ref 0.40–1.50)
GFR: 72.39 mL/min (ref >60.00)
Glucose, Bld: 182 mg/dL — ABNORMAL HIGH (ref 70–99)
Potassium: 4.3 meq/L (ref 3.5–5.1)
Sodium: 139 meq/L (ref 135–145)
Total Bilirubin: 0.4 mg/dL (ref 0.2–1.2)
Total Protein: 6.2 g/dL (ref 6.0–8.3)

## 2023-09-08 LAB — LIPID PANEL
Cholesterol: 134 mg/dL (ref 0–200)
HDL: 40.1 mg/dL (ref >39.00)
LDL Cholesterol: 64 mg/dL (ref 0–99)
NonHDL: 93.75
Total CHOL/HDL Ratio: 3
Triglycerides: 149 mg/dL (ref 0.0–149.0)
VLDL: 29.8 mg/dL (ref 0.0–40.0)

## 2023-09-08 LAB — LDL CHOLESTEROL, DIRECT: Direct LDL: 71 mg/dL

## 2023-09-08 LAB — HEMOGLOBIN A1C: Hgb A1c MFr Bld: 6.1 % (ref 4.6–6.5)

## 2023-09-08 NOTE — Assessment & Plan Note (Signed)
 Control has  improved with addition of  Farxiga  5 mg to current regimen of metformin  and glipizide .  No changes today   Lab Results  Component Value Date   HGBA1C 6.1 09/07/2023   Lab Results  Component Value Date   MICROALBUR <0.7 03/07/2023

## 2023-09-08 NOTE — Assessment & Plan Note (Signed)
 Currently well-controlled on current medications .  hemoglobin A1c is at goal of less than 7.0 .

## 2023-09-08 NOTE — Assessment & Plan Note (Signed)
 LDL and triglycerides are at goal on  rosuvastatin  and fenofibrate , and he is taking a baby aspirin daily.  He has no side effects and liver enzymes are normal.   Lab Results  Component Value Date   CHOL 134 09/07/2023   HDL 40.10 09/07/2023   LDLCALC 64 09/07/2023   LDLDIRECT 71.0 09/07/2023   TRIG 149.0 09/07/2023   CHOLHDL 3 09/07/2023   Lab Results  Component Value Date   ALT 14 09/07/2023   AST 15 09/07/2023   ALKPHOS 50 09/07/2023   BILITOT 0.4 09/07/2023

## 2023-09-08 NOTE — Assessment & Plan Note (Signed)
 Well controlled on current regimen of amlodipine , carvedilol , and telmisartan  .  Renal function stable, no changes today.  Lab Results  Component Value Date   CREATININE 0.98 09/07/2023   Lab Results  Component Value Date   NA 139 09/07/2023   K 4.3 09/07/2023   CL 102 09/07/2023   CO2 27 09/07/2023

## 2023-09-10 ENCOUNTER — Ambulatory Visit: Payer: Self-pay | Admitting: Internal Medicine

## 2023-09-14 ENCOUNTER — Other Ambulatory Visit: Payer: Self-pay | Admitting: Internal Medicine

## 2023-09-15 ENCOUNTER — Other Ambulatory Visit: Payer: Self-pay | Admitting: Internal Medicine

## 2023-09-22 ENCOUNTER — Other Ambulatory Visit: Payer: Self-pay | Admitting: Internal Medicine

## 2023-09-29 ENCOUNTER — Ambulatory Visit: Payer: Self-pay

## 2023-09-29 NOTE — Telephone Encounter (Signed)
 Spoke with pt's wife and she stated that he only had that one episode last night. She did not think that he needed to go to the ED right now. She stated that he is scheduled to see Rollene Northern, NP in the morning to make sure he doesn't have a UTI.
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
## 2023-09-30 ENCOUNTER — Ambulatory Visit: Admitting: Family

## 2023-09-30 ENCOUNTER — Encounter: Payer: Self-pay | Admitting: Family

## 2023-09-30 VITALS — BP 118/62 | HR 65 | Temp 97.4°F | Resp 20 | Ht 69.0 in

## 2023-09-30 DIAGNOSIS — F03B4 Unspecified dementia, moderate, with anxiety: Secondary | ICD-10-CM

## 2023-09-30 DIAGNOSIS — R4189 Other symptoms and signs involving cognitive functions and awareness: Secondary | ICD-10-CM

## 2023-09-30 LAB — CBC WITH DIFFERENTIAL/PLATELET
Basophils Absolute: 0 K/uL (ref 0.0–0.1)
Basophils Relative: 0.6 % (ref 0.0–3.0)
Eosinophils Absolute: 0.2 K/uL (ref 0.0–0.7)
Eosinophils Relative: 2.9 % (ref 0.0–5.0)
HCT: 40.3 % (ref 39.0–52.0)
Hemoglobin: 13.4 g/dL (ref 13.0–17.0)
Lymphocytes Relative: 23 % (ref 12.0–46.0)
Lymphs Abs: 1.9 K/uL (ref 0.7–4.0)
MCHC: 33.3 g/dL (ref 30.0–36.0)
MCV: 90 fl (ref 78.0–100.0)
Monocytes Absolute: 0.9 K/uL (ref 0.1–1.0)
Monocytes Relative: 11.2 % (ref 3.0–12.0)
Neutro Abs: 5.2 K/uL (ref 1.4–7.7)
Neutrophils Relative %: 62.3 % (ref 43.0–77.0)
Platelets: 297 K/uL (ref 150.0–400.0)
RBC: 4.48 Mil/uL (ref 4.22–5.81)
RDW: 13.9 % (ref 11.5–15.5)
WBC: 8.4 K/uL (ref 4.0–10.5)

## 2023-09-30 LAB — URINALYSIS, ROUTINE W REFLEX MICROSCOPIC
Bilirubin Urine: NEGATIVE
Hgb urine dipstick: NEGATIVE
Ketones, ur: NEGATIVE
Leukocytes,Ua: NEGATIVE
Nitrite: NEGATIVE
RBC / HPF: NONE SEEN (ref 0–?)
Specific Gravity, Urine: 1.015 (ref 1.000–1.030)
Total Protein, Urine: NEGATIVE
Urine Glucose: >=1000 — AB
Urobilinogen, UA: 0.2 (ref 0.0–1.0)
pH: 6 (ref 5.0–8.0)

## 2023-09-30 LAB — TSH: TSH: 0.47 u[IU]/mL (ref 0.35–5.50)

## 2023-09-30 LAB — POC URINALSYSI DIPSTICK (AUTOMATED)
Bilirubin, UA: NEGATIVE
Blood, UA: NEGATIVE
Glucose, UA: POSITIVE — AB
Ketones, UA: NEGATIVE
Leukocytes, UA: NEGATIVE
Nitrite, UA: NEGATIVE
Protein, UA: NEGATIVE
Spec Grav, UA: 1.01 (ref 1.010–1.025)
Urobilinogen, UA: 0.2 U/dL
pH, UA: 6 (ref 5.0–8.0)

## 2023-09-30 LAB — COMPREHENSIVE METABOLIC PANEL WITH GFR
ALT: 15 U/L (ref 0–53)
AST: 17 U/L (ref 0–37)
Albumin: 3.8 g/dL (ref 3.5–5.2)
Alkaline Phosphatase: 46 U/L (ref 39–117)
BUN: 20 mg/dL (ref 6–23)
CO2: 27 meq/L (ref 19–32)
Calcium: 9.5 mg/dL (ref 8.4–10.5)
Chloride: 104 meq/L (ref 96–112)
Creatinine, Ser: 0.91 mg/dL (ref 0.40–1.50)
GFR: 79.09 mL/min (ref >60.00)
Glucose, Bld: 108 mg/dL — ABNORMAL HIGH (ref 70–99)
Potassium: 4.1 meq/L (ref 3.5–5.1)
Sodium: 137 meq/L (ref 135–145)
Total Bilirubin: 0.4 mg/dL (ref 0.2–1.2)
Total Protein: 5.9 g/dL — ABNORMAL LOW (ref 6.0–8.3)

## 2023-09-30 MED ORDER — QUETIAPINE FUMARATE 25 MG PO TABS: 25.0000 mg | ORAL_TABLET | Freq: Every day | ORAL | 2 refills | Status: DC

## 2023-09-30 NOTE — Patient Instructions (Addendum)
 Please continue Lexapro  10 mg for anxiety depression.  We are starting an additional medication Seroquel  25 mg in the evening or at bedtime for mood regulation, irritability. Please monitor for side effects including excessive sedation, abnormal arm or leg muscle movements.    When I see we will do an EKG to ensure there have been no EKG changes.     Please call us  with any concerns new symptoms beforehand.

## 2023-09-30 NOTE — Progress Notes (Unsigned)
 Assessment & Plan:  Cognitive changes -     POCT Urinalysis Dipstick (Automated) -     Urine Culture -     Urinalysis, Routine w reflex microscopic -     Comprehensive metabolic panel with GFR -     CBC with Differential/Platelet -     TSH -     QUEtiapine  Fumarate; Take 1 tablet (25 mg total) by mouth at bedtime.  Dispense: 30 tablet; Refill: 2  Moderate dementia with anxiety, unspecified dementia type [facility]) Assessment & Plan: Chronic, worsening.  No acute symptoms to suggest delirium.  Patient does not recall incidents 2 nights ago in which he was irritated.  Reassuring neurologic exam.  A & O x 3 ( disoriented to time).  Urinalysis point-of-care negative white blood cells, red blood cells and nitrites.  pending blood work at this time.  Reviewed prior PCP note 09/07/2023 as well as neurology note 08/08/2023.  Consulted with Dr Marylynn who came in the room to see the patient and family.  We jointly agreed to continue Lexapro  10 mg daily, with addition of Seroquel  25 mg nightly for mood stabilization.  Counseled on EPS symptoms, QT prolongation and sedation on Seroquel .  Close follow-up in 1 week time for repeat EKG.  Orders: -     QUEtiapine  Fumarate; Take 1 tablet (25 mg total) by mouth at bedtime.  Dispense: 30 tablet; Refill: 2     Return precautions given.   Risks, benefits, and alternatives of the medications and treatment plan prescribed today were discussed, and patient expressed understanding.   Education regarding symptom management and diagnosis given to patient on AVS either electronically or printed.  Return in about 1 week (around 10/07/2023).  Rollene Northern, FNP  Subjective:    Patient ID: [name] [name], male    DOB: 04-16-1940, 83 y.o.   MRN: [id number]  CC: [name] [name] is a 83 y.o. male who presents today for an acute visit.    HPI: Accompanied by son, wife 2 days ago he was more anger using words.    History of paroxysmal atrial fibrillation, PSVT, OSA,  diabetes, dementia, basal cell carcinoma, B12 deficiency, cardiomyopathy Follow-up PCP 09/07/2023 for hypertension, diabetes, dementia.  No changes to medication regimen. Dr Marylynn discussed progression of moderate dementia. Consult Dr. Maree 08/08/2023 for mild cognitive disorder, encephalopathy, OSA Vascular dementia , Chronic traumatic encephalopathy  started on aricept   MRI Brain 02/2019 for worsening memory loss, dementia No acute or reversible finding. Widespread advanced chronic small vessel ischemic change of the cerebral hemispheric white matter. EKG 05/04/2023 QT 408.  Allergies: Neosporin [neomycin-bacitracin zn-polymyx] and Polysporin [bacitracin-polymyxin b] Current Outpatient Medications on File Prior to Visit  Medication Sig Dispense Refill   amLODipine  (NORVASC ) 10 MG tablet TAKE ONE TABLET (10 MG) BY MOUTH EVERY DAY 90 tablet 1   aspirin 81 MG tablet Take 81 mg by mouth daily.     B-D 3CC LUER-LOK SYR 25GX1 25G X 1 3 ML MISC USE AS DIRECTED FOR B12 INJECTIONS 6 each 0   blood glucose meter kit and supplies KIT Use once daily to check blood sugars (90 day supply) 1 each 0   carvedilol  (COREG ) 3.125 MG tablet TAKE 1 TABLET BY MOUTH TWICE DAILY WITH MEALS 60 tablet 1   co-enzyme Q-10 30 MG capsule Take 30 mg by mouth daily.     cyanocobalamin  (VITAMIN B12) 1000 MCG/ML injection Inject 1 mL (1,000 mcg total) into the muscle every 30 (thirty) days. 3  mL 3   donepezil (ARICEPT) 5 MG tablet Take 5 mg by mouth at bedtime.     escitalopram  (LEXAPRO ) 10 MG tablet TAKE 1 TABLET BY MOUTH ONCE DAILY 30 tablet 2   FARXIGA  5 MG TABS tablet TAKE 1 TABLET BY MOUTH DAILY BEFORE BREAKFAST 90 tablet 2   fish oil-omega-3 fatty acids 1000 MG capsule Take 2 g by mouth daily.     glipiZIDE  (GLUCOTROL ) 5 MG tablet TAKE ONE TABLET BY MOUTH TWICE DAILY BEFORE A MEAL 180 tablet 1   metFORMIN  (GLUCOPHAGE ) 850 MG tablet TAKE ONE TABLET TWICE A DAY WITH MEALS 180 tablet 1   Multiple Vitamin (MULTIVITAMIN)  tablet Take 1 tablet by mouth daily.     NEEDLE, DISP, 25 G 25G X 1 MISC 1 application by Does not apply route every 30 (thirty) days. 6 each 0   rosuvastatin  (CRESTOR ) 40 MG tablet TAKE ONE TABLET EACH EVENING 90 tablet 1   telmisartan  (MICARDIS ) 40 MG tablet TAKE ONE TABLET (40 MG) BY MOUTH AT BEDTIME 90 tablet 1   Cholecalciferol (D3-1000) 25 MCG (1000 UT) capsule Take 1,000 Units by mouth daily.     Choline Fenofibrate  (FENOFIBRIC ACID ) 135 MG CPDR Take 1 tablet by mouth daily. 90 capsule 1   No current facility-administered medications on file prior to visit.    Review of Systems  Constitutional:  Negative for chills and fever.  Respiratory:  Negative for cough.   Cardiovascular:  Negative for chest pain and palpitations.  Gastrointestinal:  Negative for nausea and vomiting.  Psychiatric/Behavioral:  Positive for agitation and behavioral problems. Negative for hallucinations and sleep disturbance. The patient is nervous/anxious. The patient is not hyperactive.       Objective:    BP 118/62   Pulse 65   Temp (!) 97.4 F (36.3 C)   Resp 20   Ht 5' 9 (1.753 m)   SpO2 97%   BMI 26.05 kg/m   BP Readings from Last 3 Encounters:  09/30/23 118/62  09/07/23 (!) 100/58  05/04/23 110/60   Wt Readings from Last 3 Encounters:  09/07/23 176 lb 6.4 oz (80 kg)  05/04/23 181 lb (82.1 kg)  03/07/23 179 lb 12.8 oz (81.6 kg)    Physical Exam Vitals reviewed.  Constitutional:      Appearance: Normal appearance. He is well-developed.  HENT:     Right Ear: Hearing normal.     Left Ear: Hearing normal.     Mouth/Throat:     Pharynx: Uvula midline. No posterior oropharyngeal erythema.  Eyes:     General: Lids are normal. Lids are everted, no foreign bodies appreciated.     Conjunctiva/sclera: Conjunctivae normal.     Pupils: Pupils are equal, round, and reactive to light.     Comments: Normal fundus bilaterally.  Neck:     Thyroid : No thyroid  mass or thyromegaly.   Cardiovascular:     Rate and Rhythm: Regular rhythm.     Heart sounds: Normal heart sounds.  Pulmonary:     Effort: Pulmonary effort is normal. No respiratory distress.     Breath sounds: Normal breath sounds. No wheezing, rhonchi or rales.  Abdominal:     General: Bowel sounds are normal. There is no distension.     Palpations: Abdomen is soft. Abdomen is not rigid. There is no fluid wave or mass.     Tenderness: There is no abdominal tenderness. There is no guarding or rebound. Negative signs include Murphy's sign and McBurney's sign.  Lymphadenopathy:     Head:     Right side of head: No submental, submandibular, tonsillar, preauricular, posterior auricular or occipital adenopathy.     Left side of head: No submental, submandibular, tonsillar, preauricular, posterior auricular or occipital adenopathy.     Cervical: No cervical adenopathy.  Skin:    General: Skin is warm and dry.  Neurological:     Mental Status: He is alert.     Cranial Nerves: No cranial nerve deficit.     Sensory: No sensory deficit.     Deep Tendon Reflexes:     Reflex Scores:      Bicep reflexes are 2+ on the right side and 2+ on the left side.      Patellar reflexes are 2+ on the right side and 2+ on the left side.    Comments: A & O x 3 ( disoriented to time).Grip equal and strong bilateral upper extremities. Able to perform rapid alternating movement without difficulty. Rollator in exam room.   Psychiatric:        Speech: Speech normal.        Behavior: Behavior normal.

## 2023-10-01 ENCOUNTER — Ambulatory Visit: Payer: Self-pay | Admitting: Family

## 2023-10-01 DIAGNOSIS — B379 Candidiasis, unspecified: Secondary | ICD-10-CM

## 2023-10-01 IMAGING — DX DG CHEST 2V
2 series · 2 of 2 positions shown · non-contrast
Comparison: None.

CLINICAL DATA: New atrial fibrillation.  Rule out CHF.

EXAM:
CHEST - 2 VIEW

[chest pa]
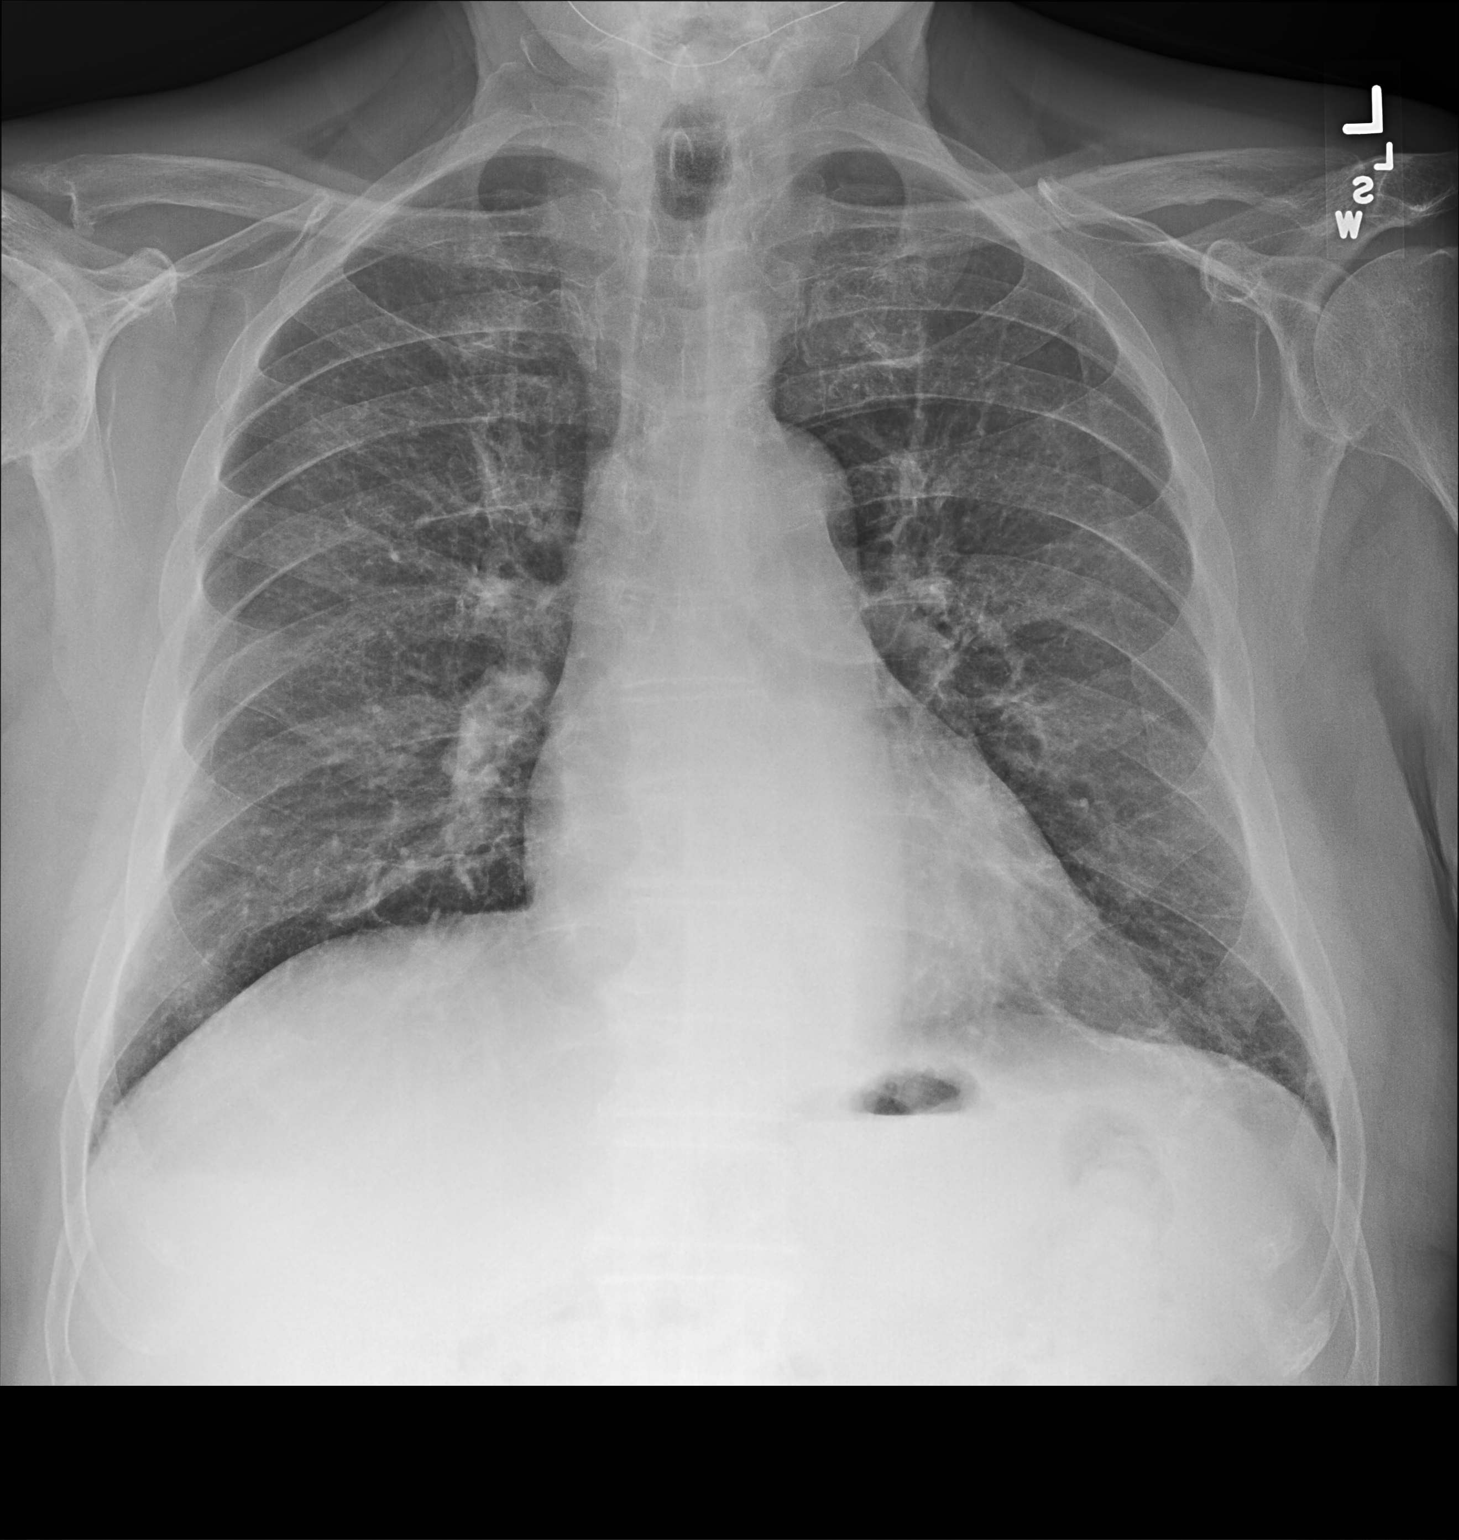

[chest lat]
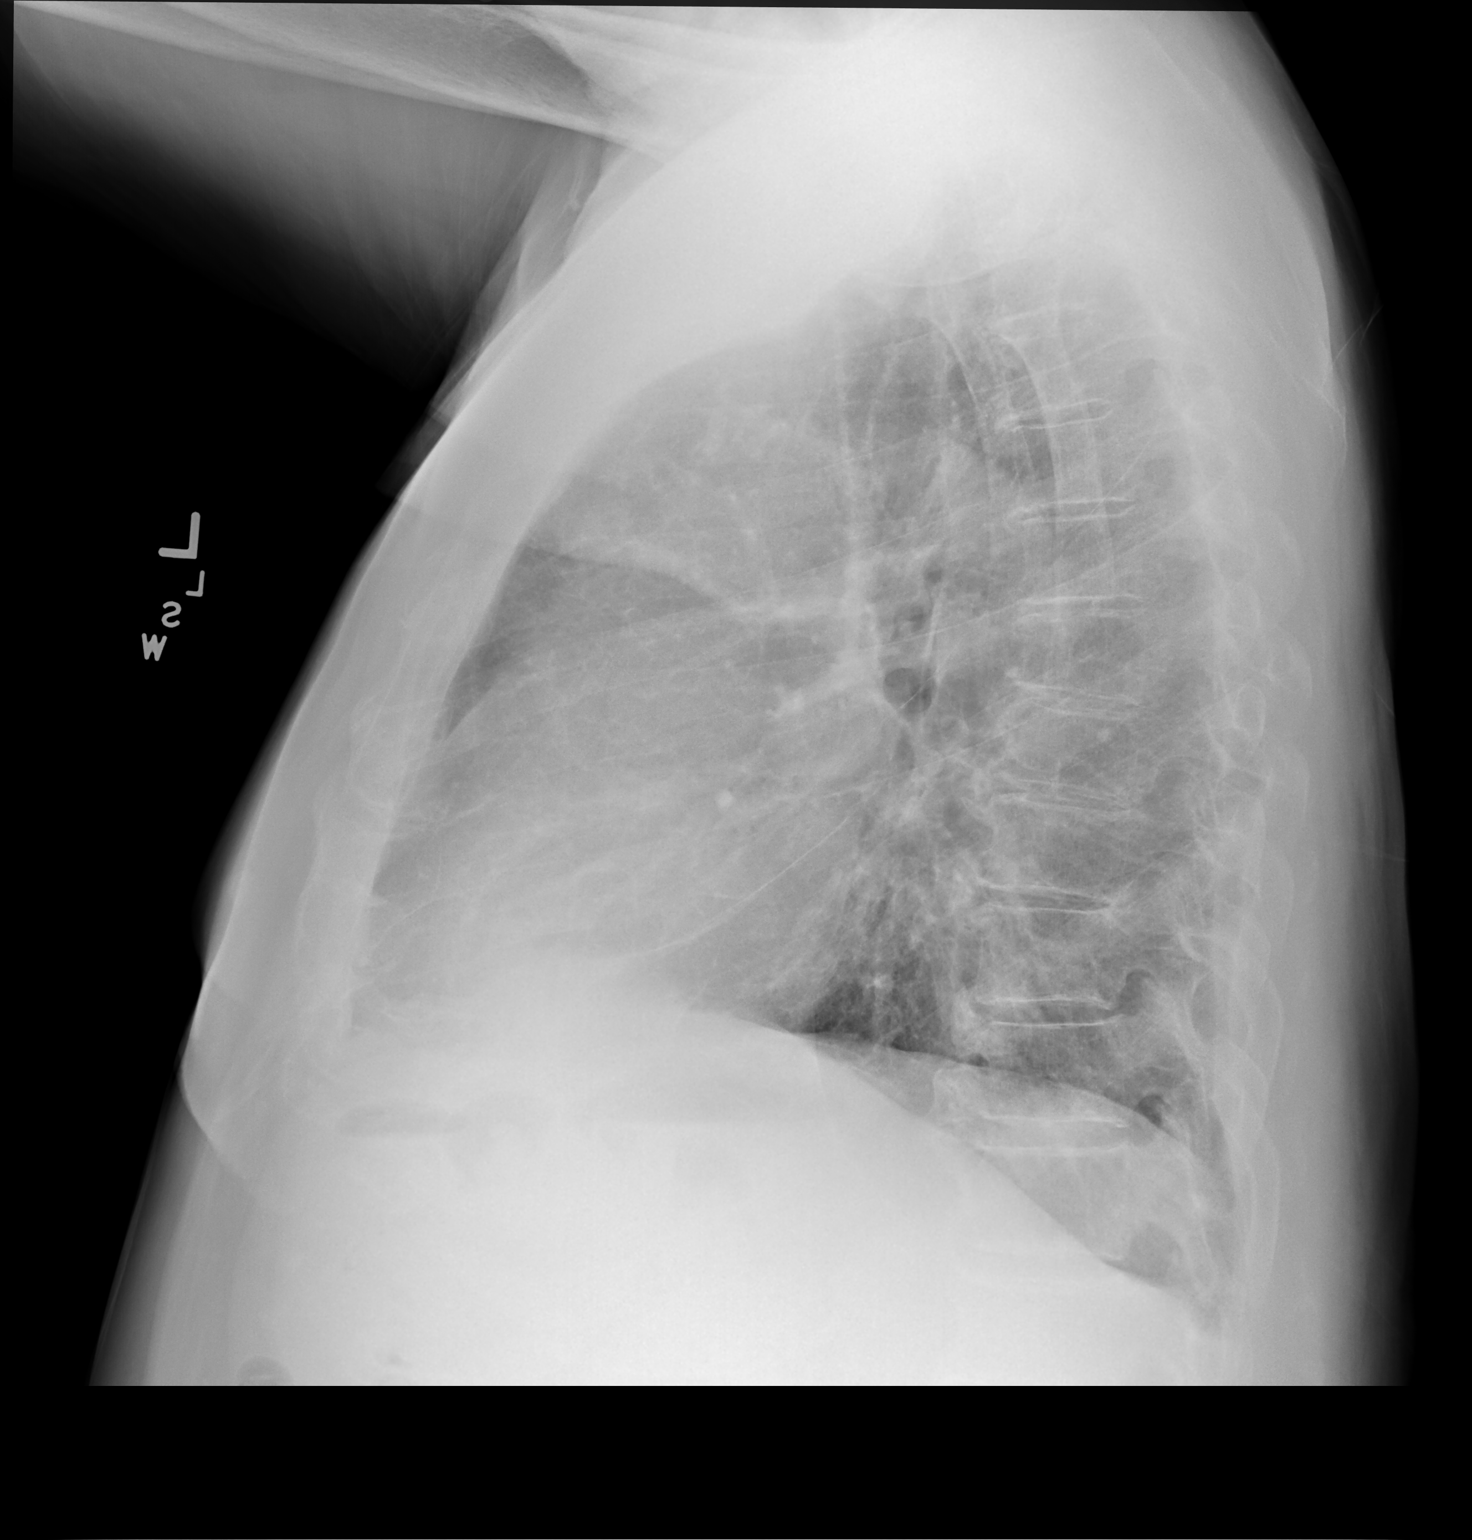

[2 of 2 positions shown; findings below may reference images not displayed]

FINDINGS: Mild diffuse chronic interstitial coarsening. No focal
consolidation, pleural effusion or pneumothorax. The cardiac
silhouette is within normal limits. Degenerative changes of the
spine. No acute osseous pathology.
IMPRESSION: No active cardiopulmonary disease.

## 2023-10-01 NOTE — Assessment & Plan Note (Addendum)
 Chronic, worsening.  No acute symptoms to suggest delirium.  Patient does not recall incidents 2 nights ago in which he was irritated.  Reassuring neurologic exam.  A & O x 3 ( disoriented to time).  Urinalysis point-of-care negative white blood cells, red blood cells and nitrites.  pending blood work at this time.  Reviewed prior PCP note 09/07/2023 as well as neurology note 08/08/2023.  Consulted with Dr Marylynn who came in the room to see the patient and family.  We jointly agreed to continue Lexapro  10 mg daily, with addition of Seroquel  25 mg nightly for mood stabilization.  Counseled on EPS symptoms, QT prolongation and sedation on Seroquel .  Close follow-up in 1 week time for repeat EKG.

## 2023-10-02 LAB — URINE CULTURE
MICRO NUMBER:: 16961070
SPECIMEN QUALITY:: ADEQUATE

## 2023-10-03 MED ORDER — CLOTRIMAZOLE 1 % EX CREA: 1.0000 | TOPICAL_CREAM | Freq: Two times a day (BID) | CUTANEOUS | 1 refills | Status: AC

## 2023-10-18 ENCOUNTER — Ambulatory Visit: Admitting: Internal Medicine

## 2023-11-11 ENCOUNTER — Ambulatory Visit: Payer: Self-pay

## 2023-11-11 ENCOUNTER — Ambulatory Visit: Admitting: Internal Medicine

## 2023-11-11 VITALS — BP 100/56 | HR 68 | Temp 97.4°F | Ht 69.0 in

## 2023-11-11 DIAGNOSIS — R296 Repeated falls: Secondary | ICD-10-CM | POA: Diagnosis not present

## 2023-11-11 DIAGNOSIS — T148XXA Other injury of unspecified body region, initial encounter: Secondary | ICD-10-CM | POA: Diagnosis not present

## 2023-11-11 MED ORDER — MUPIROCIN 2 % EX OINT: 1.0000 | TOPICAL_OINTMENT | Freq: Two times a day (BID) | CUTANEOUS | 0 refills | Status: AC

## 2023-11-11 NOTE — Telephone Encounter (Signed)
 FYI Only or Action Required?: FYI only for provider.  Patient was last seen in primary care on 09/30/2023 by Dineen Rollene MATSU, FNP.  Called Nurse Triage reporting Fall.  Symptoms began a week ago.  Interventions attempted: Rest, hydration, or home remedies and Ice/heat application.  Symptoms are: gradually worsening.  Triage Disposition: See PCP When Office is Open (Within 3 Days)  Patient/caregiver understands and will follow disposition?: Yes  Copied from CRM #[id number]. Topic: Clinical - Red Word Triage >> Nov 11, 2023 10:15 AM Alfonso HERO wrote: Red Word that prompted transfer to Nurse Triage: patient fell and injured his leg and it is red around the wound where one of his braces are. Reason for Disposition  [1] Last tetanus shot > 10 years ago AND [2] CLEAN cut or scrape (e.g., object AND skin were clean)  Answer Assessment - Initial Assessment Questions Going out the door Friday, fell and scraped his leg. Friend and son noticed yesterday he has a wound on his leg- red and slightly swollen. Wears leg braces and the brace does rub across the wound. Left leg on his shin bone-  Friend Libby calling in to get patient scheduled per request from son. Patient recently lost his wife Channing. Family friend will bring him to appt this afternoon to evaluate the leg. ED/UC precautions given and understood  1. MECHANISM: How did the fall happen?     tripped 3. ONSET: When did the fall happen? (e.g., minutes, hours, or days ago)     Friday 4. LOCATION: What part of the body hit the ground? (e.g., back, buttocks, head, hips, knees, hands, head, stomach)     Scraped his leg and knee 5. INJURY: Did you hurt (injure) yourself when you fell? If Yes, ask: What did you injure? Tell me more about this? (e.g., body area; type of injury; pain severity)     Scraped his knees. And sore on his shin  6. PAIN: Is there any pain? If Yes, ask: How bad is the pain? (e.g., Scale 0-10; or none, mild,       Denies pain 7. SIZE: For cuts, bruises, or swelling, ask: How large is it? (e.g., inches or centimeters)      Dime size with a little redness 9. OTHER SYMPTOMS: Do you have any other symptoms? (e.g., dizziness, fever, weakness; new-onset or worsening).      denies 10. CAUSE: What do you think caused the fall (or falling)? (e.g., dizzy spell, tripped)       tripped  Answer Assessment - Initial Assessment Questions 1. MECHANISM: How did the injury happen? (e.g., twisting injury, direct blow)      fall 2. ONSET: When did the injury happen? (e.g., minutes, hours ago)      Friday possibly during fall or from leg brace 3. LOCATION: Where is the injury located?      Left shin 4. APPEARANCE of INJURY: What does the injury look like?  (e.g., deformity of leg)     Dime size area, red raised, irritated 5. SEVERITY: Can you put weight on that leg? Can you walk?      yes 6. SIZE: For cuts, bruises, or swelling, ask: How large is it? (e.g., inches or centimeters)     Dime size sore- redness surrounding  7. PAIN: Is there pain? If Yes, ask: How bad is the pain?   What does it keep you from doing? (Scale 0-10; or none, mild, moderate, severe)     denies 8. TETANUS: For  any breaks in the skin, ask: When was your last tetanus booster?     2012 9. OTHER SYMPTOMS: Do you have any other symptoms?      denies  Protocols used: Falls and Falling-A-AH, Leg Injury-A-AH

## 2023-11-11 NOTE — Progress Notes (Signed)
 Subjective:    Patient ID: [name] [name], male    DOB: 02/27/1940, 83 y.o.   MRN: [id number]  Patient here for  Chief Complaint  Patient presents with   Wound Check    HPI Here for work in appt. Work in with concerns regarding abrasions - left lower leg. He is accompanied by his granddaughter and a friend. History obtained from all of them. Granddaughter reports he fell approximately one week ago. Dogs under his feet - tripped over them. Scraped his left lower leg. Has three areas - abrasions. Wears braces to help with foot drop. It appears the brace has been rubbing over the lower two abrasions. No fever. Has a small abrasion - left hand. No head injury. No fever. States he is doing ok otherwise. No other injury. Granddaughter does report he has had a couple of other falls recently. No injury. Uses a cane. Hold on to furniture to get around the house.    Past Medical History:  Diagnosis Date   Abnormal echocardiogram    a. 01/2021 Echo: EF 50%, septal DK (bbb/IVCD), infero/posterior HK. GrI DD. Nl RV fxn. Ao root 37mm. Asc Ao 36mm.   Cataracts, bilateral    Diabetes mellitus    Type 2   Hyperlipidemia    Hypertension    Peripheral neuropathy 01/19/2007   positive EMG studies, negative workup for causes   Positive PPD, treated 01/19/1968   Premature atrial contractions    a. 12/2020 Zio: predominantly RSR, 67 (47-107). Freq PACs (7-8% burden). Rare PVCs. 149 atrial runs (longest 11.5 secs, max rate 193). No sustained arrhythmias/pauses; no afib/flutter.   Past Surgical History:  Procedure Laterality Date   CATARACT EXTRACTION W/PHACO Right 11/24/2020   Procedure: CATARACT EXTRACTION PHACO AND INTRAOCULAR LENS PLACEMENT (IOC) RIGHT DIABETIC 4.17 00:33.8;  Surgeon: Myrna Adine Anes, MD;  Location: [facility];  Service: Ophthalmology;  Laterality: Right;   CATARACT EXTRACTION W/PHACO Left 12/08/2020   Procedure: CATARACT EXTRACTION PHACO AND INTRAOCULAR LENS PLACEMENT (IOC)  LEFT DIABETIC;  Surgeon: Myrna Adine Anes, MD;  Location: [facility];  Service: Ophthalmology;  Laterality: Left;  8.81 00:44.4   COLONOSCOPY  2004, 2014   Dr Dessa   COLONOSCOPY WITH PROPOFOL  N/A 04/30/2015   Procedure: COLONOSCOPY WITH PROPOFOL ;  Surgeon: Reyes LELON Dessa, MD;  Location: [facility];  Service: Endoscopy;  Laterality: N/A;   ROTATOR CUFF REPAIR  2005   right (Dr. Memory)   TONSILLECTOMY     WRIST FRACTURE SURGERY Left    Armour, Ted    Family History  Problem Relation Age of Onset   Heart attack Father 42   Dementia Sister    Kidney disease Sister    Cancer Neg Hx    Social History   Socioeconomic History   Marital status: Married    Spouse name: Not on file   Number of children: Not on file   Years of education: Not on file   Highest education level: Not on file  Occupational History   Not on file  Tobacco Use   Smoking status: Former    Current packs/day: 0.00    Average packs/day: 2.0 packs/day for 30.0 years (60.0 ttl pk-yrs)    Types: Cigarettes    Start date: 58    Quit date: 47    Years since quitting: 43.8   Smokeless tobacco: Former    Types: Chew    Quit date: 10/19/1990  Vaping Use   Vaping status: Never Used  Substance  and Sexual Activity   Alcohol use: Not Currently   Drug use: No   Sexual activity: Not Currently  Other Topics Concern   Not on file  Social History Narrative   Not on file   Social Drivers of Health   Financial Resource Strain: Low Risk  (08/08/2023)   Received from [facility]   Overall Financial Resource Strain (CARDIA)    Difficulty of Paying Living Expenses: Not hard at all  Food Insecurity: No Food Insecurity (08/08/2023)   Received from [facility]   Hunger Vital Sign    Within the past 12 months, you worried that your food would run out before you got the money to buy more.: Never true    Within the past 12 months, the food you bought just didn't last  and you didn't have money to get more.: Never true  Transportation Needs: No Transportation Needs (08/08/2023)   Received from [facility] - Transportation    In the past 12 months, has lack of transportation kept you from medical appointments or from getting medications?: No    Lack of Transportation (Non-Medical): No  Physical Activity: Sufficiently Active (09/14/2021)   Exercise Vital Sign    Days of Exercise per Week: 3 days    Minutes of Exercise per Session: 60 min  Stress: No Stress Concern Present (09/14/2021)   [employer] - Occupational Stress Questionnaire    Feeling of Stress : Not at all  Social Connections: Unknown (09/14/2021)   Social Connection and Isolation Panel    Frequency of Communication with Friends and Family: Not on file    Frequency of Social Gatherings with Friends and Family: Not on file    Attends Religious Services: Not on file    Active Member of Clubs or Organizations: Not on file    Attends Banker Meetings: Not on file    Marital Status: Married     Review of Systems  Constitutional:  Negative for chills and fever.  HENT:  Negative for congestion and sinus pressure.   Respiratory:  Negative for cough, chest tightness and shortness of breath.   Cardiovascular:  Negative for chest pain, palpitations and leg swelling.  Gastrointestinal:  Negative for abdominal pain, nausea and vomiting.  Skin:        Abrasions - three area - lower left leg - below knee. Top leaion - scabbed over. Two lower lesions - open - superficial. Minimal surrounding erythema. No redness extending up or down the leg. Small abrasion - left hand.   Neurological:  Negative for dizziness and headaches.  Psychiatric/Behavioral:  Negative for agitation and dysphoric mood.        Objective:     BP (!) 100/56   Pulse 68   Temp (!) 97.4 F (36.3 C)   Ht 5' 9 (1.753 m)   SpO2 94%   BMI 26.05 kg/m  Wt Readings  from Last 3 Encounters:  09/07/23 176 lb 6.4 oz (80 kg)  05/04/23 181 lb (82.1 kg)  03/07/23 179 lb 12.8 oz (81.6 kg)    Physical Exam Constitutional:      General: He is not in acute distress.    Appearance: Normal appearance. He is well-developed.  HENT:     Head: Normocephalic and atraumatic.  Eyes:     General: No scleral icterus.       Right eye: No discharge.  Left eye: No discharge.  Cardiovascular:     Rate and Rhythm: Normal rate and regular rhythm.  Pulmonary:     Effort: Pulmonary effort is normal. No respiratory distress.     Breath sounds: Normal breath sounds.  Abdominal:     General: Bowel sounds are normal.     Palpations: Abdomen is soft.     Tenderness: There is no abdominal tenderness.  Musculoskeletal:        General: No swelling or tenderness.     Cervical back: Neck supple. No tenderness.  Lymphadenopathy:     Cervical: No cervical adenopathy.  Skin:    Comments: Abrasions - three area - lower left leg - below knee. Top leaion - scabbed over. Two lower lesions - open - superficial. Minimal surrounding erythema. No redness extending up or down the leg. Small abrasion - left hand.   Neurological:     Mental Status: He is alert.  Psychiatric:        Mood and Affect: Mood normal.        Behavior: Behavior normal.         Outpatient Encounter Medications as of 11/11/2023  Medication Sig   mupirocin ointment (BACTROBAN) 2 % Apply 1 Application topically 2 (two) times [name].   amLODipine  (NORVASC ) 10 MG tablet TAKE ONE TABLET (10 MG) BY MOUTH EVERY DAY   aspirin 81 MG tablet Take 81 mg by mouth [name].   B-D 3CC LUER-LOK SYR 25GX1 25G X 1 3 ML MISC USE AS DIRECTED FOR B12 INJECTIONS   blood glucose meter kit and supplies KIT Use once [name] to check blood sugars (90 day supply)   carvedilol  (COREG ) 3.125 MG tablet TAKE 1 TABLET BY MOUTH TWICE [name] WITH MEALS   Cholecalciferol (D3-1000) 25 MCG (1000 UT) capsule Take 1,000 Units by mouth [name].    Choline Fenofibrate  (FENOFIBRIC ACID ) 135 MG CPDR Take 1 tablet by mouth [name].   clotrimazole  (LOTRIMIN ) 1 % cream Apply 1 Application topically 2 (two) times [name]. External genitalia   co-enzyme Q-10 30 MG capsule Take 30 mg by mouth [name].   cyanocobalamin  (VITAMIN B12) 1000 MCG/ML injection Inject 1 mL (1,000 mcg total) into the muscle every 30 (thirty) days.   donepezil (ARICEPT) 5 MG tablet Take 5 mg by mouth at bedtime.   escitalopram  (LEXAPRO ) 10 MG tablet TAKE 1 TABLET BY MOUTH ONCE [name]   FARXIGA  5 MG TABS tablet TAKE 1 TABLET BY MOUTH [name] BEFORE BREAKFAST   fish oil-omega-3 fatty acids 1000 MG capsule Take 2 g by mouth [name].   glipiZIDE  (GLUCOTROL ) 5 MG tablet TAKE ONE TABLET BY MOUTH TWICE [name] BEFORE A MEAL   metFORMIN  (GLUCOPHAGE ) 850 MG tablet TAKE ONE TABLET TWICE A DAY WITH MEALS   Multiple Vitamin (MULTIVITAMIN) tablet Take 1 tablet by mouth [name].   NEEDLE, DISP, 25 G 25G X 1 MISC 1 application by Does not apply route every 30 (thirty) days.   QUEtiapine  (SEROQUEL ) 25 MG tablet Take 1 tablet (25 mg total) by mouth at bedtime.   rosuvastatin  (CRESTOR ) 40 MG tablet TAKE ONE TABLET EACH EVENING   telmisartan  (MICARDIS ) 40 MG tablet TAKE ONE TABLET (40 MG) BY MOUTH AT BEDTIME   No facility-administered encounter medications on file as of 11/11/2023.     Lab Results  Component Value Date   WBC 8.4 09/30/2023   HGB 13.4 09/30/2023   HCT 40.3 09/30/2023   PLT 297.0 09/30/2023   GLUCOSE 108 (H) 09/30/2023   CHOL 134  09/07/2023   TRIG 149.0 09/07/2023   HDL 40.10 09/07/2023   LDLDIRECT 71.0 09/07/2023   LDLCALC 64 09/07/2023   ALT 15 09/30/2023   AST 17 09/30/2023   NA 137 09/30/2023   K 4.1 09/30/2023   CL 104 09/30/2023   CREATININE 0.91 09/30/2023   BUN 20 09/30/2023   CO2 27 09/30/2023   TSH 0.47 09/30/2023   PSA 1.61 08/25/2017   HGBA1C 6.1 09/07/2023   MICROALBUR <0.7 03/07/2023    MR Brain Wo Contrast Result Date: 03/30/2023 CLINICAL DATA:   Worsening memory loss.  Dementia. EXAM: MRI HEAD WITHOUT CONTRAST TECHNIQUE: Multiplanar, multiecho pulse sequences of the brain and surrounding structures were obtained without intravenous contrast. COMPARISON:  None Available. FINDINGS: Brain: Diffusion imaging does not show any acute or subacute infarction or other cause of restricted diffusion. No focal abnormality affects the brainstem or cerebellum. Cerebral hemispheres show a widespread advanced patchy and confluent chronic small vessel ischemic change of the white matter. No evidence of large vessel territory stroke. No mass, hemorrhage, hydrocephalus or extra-axial collection. Vascular: Major vessels at the base of the brain show flow. Skull and upper cervical spine: Negative Sinuses/Orbits: Clear/normal Other: None IMPRESSION: No acute or reversible finding. Widespread advanced chronic small vessel ischemic change of the cerebral hemispheric white matter. Electronically Signed   By: Oneil Officer M.D.   On: 03/30/2023 09:16       Assessment & Plan:  Abrasion Assessment & Plan: Abrasion - leg lesions as outlined. Areas cleaned. Covered with telfa and wrapped to protect from the brace. Bactroban topically as discussed. Discussed leaving the area open when sitting. Discussed wrapping when up - to protect against the irritation from the brace. Monitor closely. Call with update.    Falls Assessment & Plan: Recent falls as outlined. No head injury. Discussed balance. Discussed using cane. Consider PT. No dizziness or light headedness. Follow.  Call with update    Other orders -     Mupirocin; Apply 1 Application topically 2 (two) times [name].  Dispense: 22 g; Refill: 0     Allena Hamilton, MD

## 2023-11-12 ENCOUNTER — Encounter: Payer: Self-pay | Admitting: Internal Medicine

## 2023-11-12 DIAGNOSIS — T148XXA Other injury of unspecified body region, initial encounter: Secondary | ICD-10-CM | POA: Insufficient documentation

## 2023-11-12 DIAGNOSIS — R296 Repeated falls: Secondary | ICD-10-CM | POA: Insufficient documentation

## 2023-11-12 NOTE — Assessment & Plan Note (Signed)
 Recent falls as outlined. No head injury. Discussed balance. Discussed using cane. Consider PT. No dizziness or light headedness. Follow.  Call with update

## 2023-11-12 NOTE — Assessment & Plan Note (Signed)
 Abrasion - leg lesions as outlined. Areas cleaned. Covered with telfa and wrapped to protect from the brace. Bactroban topically as discussed. Discussed leaving the area open when sitting. Discussed wrapping when up - to protect against the irritation from the brace. Monitor closely. Call with update.

## 2023-11-21 ENCOUNTER — Ambulatory Visit: Admitting: Internal Medicine

## 2023-11-22 ENCOUNTER — Other Ambulatory Visit: Payer: Self-pay | Admitting: Internal Medicine

## 2023-11-28 ENCOUNTER — Encounter: Payer: Self-pay | Admitting: Internal Medicine

## 2023-12-03 ENCOUNTER — Other Ambulatory Visit: Payer: Self-pay | Admitting: Internal Medicine

## 2023-12-07 ENCOUNTER — Other Ambulatory Visit: Payer: Self-pay | Admitting: Internal Medicine

## 2023-12-17 ENCOUNTER — Other Ambulatory Visit: Payer: Self-pay | Admitting: Internal Medicine

## 2023-12-30 ENCOUNTER — Other Ambulatory Visit: Payer: Self-pay | Admitting: Family

## 2023-12-30 ENCOUNTER — Other Ambulatory Visit: Payer: Self-pay | Admitting: Internal Medicine

## 2023-12-30 DIAGNOSIS — F03B4 Unspecified dementia, moderate, with anxiety: Secondary | ICD-10-CM

## 2023-12-30 DIAGNOSIS — R4189 Other symptoms and signs involving cognitive functions and awareness: Secondary | ICD-10-CM

## 2024-02-17 ENCOUNTER — Other Ambulatory Visit: Payer: Self-pay | Admitting: Internal Medicine
